# Patient Record
Sex: Male | Born: 1935 | Race: White | Hispanic: No | Smoking: Never smoker
Health system: Southern US, Community
[De-identification: ages and names within clinical notes are randomized; demographics above are authoritative.]

## PROBLEM LIST (undated history)

## (undated) DIAGNOSIS — E119 Type 2 diabetes mellitus without complications: Secondary | ICD-10-CM

## (undated) DIAGNOSIS — G4700 Insomnia, unspecified: Secondary | ICD-10-CM

## (undated) DIAGNOSIS — E079 Disorder of thyroid, unspecified: Secondary | ICD-10-CM

## (undated) DIAGNOSIS — F0390 Unspecified dementia without behavioral disturbance: Secondary | ICD-10-CM

## (undated) DIAGNOSIS — I10 Essential (primary) hypertension: Secondary | ICD-10-CM

## (undated) DIAGNOSIS — N400 Enlarged prostate without lower urinary tract symptoms: Secondary | ICD-10-CM

## (undated) DIAGNOSIS — I4891 Unspecified atrial fibrillation: Secondary | ICD-10-CM

## (undated) DIAGNOSIS — E039 Hypothyroidism, unspecified: Secondary | ICD-10-CM

## (undated) DIAGNOSIS — F419 Anxiety disorder, unspecified: Secondary | ICD-10-CM

## (undated) HISTORY — DX: Disorder of thyroid, unspecified: E07.9

---

## 1998-08-11 ENCOUNTER — Encounter: Admission: RE | Admit: 1998-08-11 | Discharge: 1998-11-09 | Payer: Self-pay | Admitting: Endocrinology

## 2010-09-17 ENCOUNTER — Other Ambulatory Visit: Payer: Self-pay | Admitting: Specialist

## 2010-09-17 DIAGNOSIS — IMO0002 Reserved for concepts with insufficient information to code with codable children: Secondary | ICD-10-CM

## 2010-09-22 ENCOUNTER — Other Ambulatory Visit: Payer: Self-pay

## 2010-09-29 ENCOUNTER — Ambulatory Visit
Admission: RE | Admit: 2010-09-29 | Discharge: 2010-09-29 | Disposition: A | Discharge status: Home / Self Care | Payer: Medicare Other | Source: Ambulatory Visit | Attending: Specialist | Admitting: Specialist

## 2010-09-29 DIAGNOSIS — IMO0002 Reserved for concepts with insufficient information to code with codable children: Secondary | ICD-10-CM

## 2011-01-18 ENCOUNTER — Ambulatory Visit: Payer: Medicare Other | Admitting: Internal Medicine

## 2013-03-30 ENCOUNTER — Encounter (HOSPITAL_COMMUNITY)
Admission: EM | Disposition: A | Discharge status: Home / Self Care | Payer: Self-pay | Source: Home / Self Care | Attending: Emergency Medicine

## 2013-03-30 ENCOUNTER — Other Ambulatory Visit: Payer: Self-pay

## 2013-03-30 ENCOUNTER — Other Ambulatory Visit: Payer: Self-pay | Admitting: Ophthalmology

## 2013-03-30 ENCOUNTER — Encounter (HOSPITAL_COMMUNITY): Payer: Medicare Other | Admitting: Anesthesiology

## 2013-03-30 ENCOUNTER — Emergency Department (HOSPITAL_COMMUNITY)
Admission: EM | Admit: 2013-03-30 | Discharge: 2013-03-30 | Disposition: A | Discharge status: Home / Self Care | Payer: Medicare Other | Attending: Emergency Medicine | Admitting: Emergency Medicine

## 2013-03-30 ENCOUNTER — Emergency Department (HOSPITAL_COMMUNITY): Payer: Medicare Other | Admitting: Anesthesiology

## 2013-03-30 DIAGNOSIS — H5712 Ocular pain, left eye: Secondary | ICD-10-CM

## 2013-03-30 DIAGNOSIS — H20059 Hypopyon, unspecified eye: Secondary | ICD-10-CM | POA: Insufficient documentation

## 2013-03-30 DIAGNOSIS — E119 Type 2 diabetes mellitus without complications: Secondary | ICD-10-CM | POA: Insufficient documentation

## 2013-03-30 DIAGNOSIS — I10 Essential (primary) hypertension: Secondary | ICD-10-CM | POA: Insufficient documentation

## 2013-03-30 DIAGNOSIS — K219 Gastro-esophageal reflux disease without esophagitis: Secondary | ICD-10-CM | POA: Insufficient documentation

## 2013-03-30 HISTORY — PX: PARS PLANA VITRECTOMY: SHX2166

## 2013-03-30 LAB — CBC WITH DIFFERENTIAL/PLATELET
Basophils Absolute: 0 10*3/uL (ref 0.0–0.1)
Basophils Relative: 0 % (ref 0–1)
Eosinophils Relative: 2 % (ref 0–5)
HCT: 42.6 % (ref 39.0–52.0)
Hemoglobin: 14.9 g/dL (ref 13.0–17.0)
Lymphocytes Relative: 20 % (ref 12–46)
MCHC: 35 g/dL (ref 30.0–36.0)
Monocytes Absolute: 1 10*3/uL (ref 0.1–1.0)
Neutro Abs: 8.8 10*3/uL — ABNORMAL HIGH (ref 1.7–7.7)
Neutrophils Relative %: 70 % (ref 43–77)
RBC: 4.58 MIL/uL (ref 4.22–5.81)
RDW: 14.1 % (ref 11.5–15.5)
WBC: 12.6 10*3/uL — ABNORMAL HIGH (ref 4.0–10.5)

## 2013-03-30 LAB — COMPREHENSIVE METABOLIC PANEL
ALT: 10 U/L (ref 0–53)
AST: 16 U/L (ref 0–37)
Albumin: 3.8 g/dL (ref 3.5–5.2)
Alkaline Phosphatase: 75 U/L (ref 39–117)
BUN: 12 mg/dL (ref 6–23)
CO2: 25 mEq/L (ref 19–32)
Chloride: 97 mEq/L (ref 96–112)
Creatinine, Ser: 1.04 mg/dL (ref 0.50–1.35)
Potassium: 3.8 mEq/L (ref 3.5–5.1)
Total Bilirubin: 0.5 mg/dL (ref 0.3–1.2)
Total Protein: 7.3 g/dL (ref 6.0–8.3)

## 2013-03-30 LAB — GRAM STAIN

## 2013-03-30 LAB — GLUCOSE, CAPILLARY: Glucose-Capillary: 173 mg/dL — ABNORMAL HIGH (ref 70–99)

## 2013-03-30 SURGERY — PARS PLANA VITRECTOMY 25 GAUGE FOR ENDOPHTHALMITIS
Anesthesia: Monitor Anesthesia Care | Laterality: Left

## 2013-03-30 SURGERY — PARS PLANA VITRECTOMY 25 GAUGE FOR ENDOPHTHALMITIS
Anesthesia: Monitor Anesthesia Care | Site: Eye | Laterality: Left | Wound class: Clean Contaminated

## 2013-03-30 MED ORDER — BUPIVACAINE HCL (PF) 0.25 % IJ SOLN
INTRAMUSCULAR | Status: DC | PRN
  Administered 2013-03-30: 10 mL

## 2013-03-30 MED ORDER — CYCLOPENTOLATE HCL 1 % OP SOLN: 1.0000 [drp] | OPHTHALMIC | Status: AC | PRN

## 2013-03-30 MED ORDER — EPINEPHRINE HCL 1 MG/ML IJ SOLN
INTRAMUSCULAR | Status: AC
  Filled 2013-03-30: qty 1

## 2013-03-30 MED ORDER — EPINEPHRINE HCL 1 MG/ML IJ SOLN
INTRAMUSCULAR | Status: DC | PRN
  Administered 2013-03-30: .3 mL

## 2013-03-30 MED ORDER — GATIFLOXACIN 0.5 % OP SOLN: 1.0000 [drp] | OPHTHALMIC | Status: AC | PRN

## 2013-03-30 MED ORDER — DEXTROSE 5 % IV SOLN
1.0000 g | Freq: Three times a day (TID) | INTRAVENOUS | Status: DC
  Administered 2013-03-30: 1 g via INTRAVENOUS
  Filled 2013-03-30 (×3): qty 1

## 2013-03-30 MED ORDER — LACTATED RINGERS IV SOLN
INTRAVENOUS | Status: DC | PRN
  Administered 2013-03-30: 18:00:00 via INTRAVENOUS

## 2013-03-30 MED ORDER — BSS IO SOLN
INTRAOCULAR | Status: AC
  Filled 2013-03-30: qty 15

## 2013-03-30 MED ORDER — MIDAZOLAM HCL 2 MG/2ML IJ SOLN: 1.0000 mg | INTRAMUSCULAR | Status: DC | PRN

## 2013-03-30 MED ORDER — PROPOFOL 10 MG/ML IV BOLUS
INTRAVENOUS | Status: DC | PRN
  Administered 2013-03-30: 50 mg via INTRAVENOUS
  Administered 2013-03-30: 20 mg via INTRAVENOUS

## 2013-03-30 MED ORDER — BSS PLUS IO SOLN
INTRAOCULAR | Status: DC | PRN
  Administered 2013-03-30: 1 via INTRAOCULAR

## 2013-03-30 MED ORDER — BUPIVACAINE HCL (PF) 0.25 % IJ SOLN
INTRAMUSCULAR | Status: AC
  Filled 2013-03-30: qty 30

## 2013-03-30 MED ORDER — VANCOMYCIN INTRAVITREAL INJECTION 1 MG/0.1 ML
1.0000 mg | INTRAOCULAR | Status: AC
  Administered 2013-03-30: 1 mg via INTRAVITREAL
  Filled 2013-03-30: qty 0.1

## 2013-03-30 MED ORDER — OXYCODONE HCL 5 MG/5ML PO SOLN: 5.0000 mg | Freq: Once | ORAL | Status: DC | PRN

## 2013-03-30 MED ORDER — CEFTAZIDIME INTRAVITREAL INJECTION 2.25 MG/0.1 ML
2.2500 mg | INTRAVITREAL | Status: DC
  Filled 2013-03-30: qty 0.1

## 2013-03-30 MED ORDER — SODIUM HYALURONATE 10 MG/ML IO SOLN
INTRAOCULAR | Status: AC
  Filled 2013-03-30: qty 0.85

## 2013-03-30 MED ORDER — HYPROMELLOSE (GONIOSCOPIC) 2.5 % OP SOLN
OPHTHALMIC | Status: DC | PRN
  Administered 2013-03-30: 3 [drp] via OPHTHALMIC

## 2013-03-30 MED ORDER — FENTANYL CITRATE 0.05 MG/ML IJ SOLN: 25.0000 ug | INTRAMUSCULAR | Status: DC | PRN

## 2013-03-30 MED ORDER — LIDOCAINE HCL 2 % IJ SOLN
INTRAMUSCULAR | Status: AC
  Filled 2013-03-30: qty 20

## 2013-03-30 MED ORDER — VANCOMYCIN HCL 1000 MG IV SOLR
1000.0000 mg | INTRAVENOUS | Status: DC | PRN
  Administered 2013-03-30: 1000 mg via INTRAVENOUS

## 2013-03-30 MED ORDER — OXYCODONE HCL 5 MG PO TABS: 5.0000 mg | ORAL_TABLET | Freq: Once | ORAL | Status: DC | PRN

## 2013-03-30 MED ORDER — NA CHONDROIT SULF-NA HYALURON 40-30 MG/ML IO SOLN
INTRAOCULAR | Status: AC
  Filled 2013-03-30: qty 0.5

## 2013-03-30 MED ORDER — TETRACAINE HCL 0.5 % OP SOLN
OPHTHALMIC | Status: AC
  Filled 2013-03-30: qty 2

## 2013-03-30 MED ORDER — BSS PLUS IO SOLN
INTRAOCULAR | Status: AC
  Filled 2013-03-30: qty 500

## 2013-03-30 MED ORDER — PHENYLEPHRINE HCL 2.5 % OP SOLN: 1.0000 [drp] | OPHTHALMIC | Status: DC | PRN

## 2013-03-30 MED ORDER — HYPROMELLOSE (GONIOSCOPIC) 2.5 % OP SOLN
OPHTHALMIC | Status: AC
  Filled 2013-03-30: qty 15

## 2013-03-30 MED ORDER — DEXAMETHASONE SODIUM PHOSPHATE 10 MG/ML IJ SOLN
INTRAMUSCULAR | Status: AC
  Filled 2013-03-30: qty 1

## 2013-03-30 MED ORDER — FENTANYL CITRATE 0.05 MG/ML IJ SOLN: 50.0000 ug | Freq: Once | INTRAMUSCULAR | Status: DC

## 2013-03-30 MED ORDER — CEFTAZIDIME INTRAVITREAL INJECTION 2.25 MG/0.1 ML
INTRAVITREAL | Status: DC | PRN
  Administered 2013-03-30: 2.25 mg via INTRAVITREAL

## 2013-03-30 MED ORDER — VANCOMYCIN HCL IN DEXTROSE 1-5 GM/200ML-% IV SOLN
1000.0000 mg | INTRAVENOUS | Status: DC
  Filled 2013-03-30: qty 200

## 2013-03-30 SURGICAL SUPPLY — 52 items
APL SRG 3 HI ABS STRL LF PLS (MISCELLANEOUS)
APPLICATOR COTTON TIP 6IN STRL (MISCELLANEOUS) ×4 IMPLANT
APPLICATOR DR MATTHEWS STRL (MISCELLANEOUS) IMPLANT
CANNULA ANT CHAM MAIN (OPHTHALMIC RELATED) IMPLANT
CANNULA FLEX TIP 25G (CANNULA) IMPLANT
CANNULA VLV SOFT TIP 25G (OPHTHALMIC) ×1 IMPLANT
CANNULA VLV SOFT TIP 25GA (OPHTHALMIC) ×2 IMPLANT
CLOTH BEACON ORANGE TIMEOUT ST (SAFETY) ×2 IMPLANT
CORDS BIPOLAR (ELECTRODE) IMPLANT
COVER MAYO STAND STRL (DRAPES) ×2 IMPLANT
DRAPE OPHTHALMIC 77X100 STRL (CUSTOM PROCEDURE TRAY) ×2 IMPLANT
FILTER BLUE MILLIPORE (MISCELLANEOUS) IMPLANT
FILTER STRAW FLUID ASPIR (MISCELLANEOUS) IMPLANT
FORCEPS ECKARDT ILM 25G SERR (OPHTHALMIC RELATED) IMPLANT
GAS OPHTHALMIC (MISCELLANEOUS) IMPLANT
GLOVE ECLIPSE 6.5 STRL STRAW (GLOVE) ×2 IMPLANT
GLOVE SS BIOGEL STRL SZ 8.5 (GLOVE) ×1 IMPLANT
GLOVE SUPERSENSE BIOGEL SZ 8.5 (GLOVE) ×2
GOWN STRL NON-REIN LRG LVL3 (GOWN DISPOSABLE) ×2 IMPLANT
ILLUMINATOR ENDO 25GA (MISCELLANEOUS) ×2 IMPLANT
KIT BASIN OR (CUSTOM PROCEDURE TRAY) ×2 IMPLANT
KIT ROOM TURNOVER OR (KITS) ×2 IMPLANT
LENS BIOM SUPER VIEW SET DISP (OPHTHALMIC RELATED) ×2 IMPLANT
MARKER SKIN DUAL TIP RULER LAB (MISCELLANEOUS) ×2 IMPLANT
MASK EYE SHIELD (GAUZE/BANDAGES/DRESSINGS) ×1 IMPLANT
NDL 18GX1X1/2 (RX/OR ONLY) (NEEDLE) IMPLANT
NDL 25GX 5/8IN NON SAFETY (NEEDLE) ×1 IMPLANT
NDL HYPO 30X.5 LL (NEEDLE) ×3 IMPLANT
NEEDLE 18GX1X1/2 (RX/OR ONLY) (NEEDLE) IMPLANT
NEEDLE 25GX 5/8IN NON SAFETY (NEEDLE) ×2 IMPLANT
NEEDLE HYPO 30X.5 LL (NEEDLE) ×6 IMPLANT
PACK VITRECTOMY CUSTOM (CUSTOM PROCEDURE TRAY) ×2 IMPLANT
PAD ARMBOARD 7.5X6 YLW CONV (MISCELLANEOUS) ×4 IMPLANT
PAD EYE OVAL STERILE LF (GAUZE/BANDAGES/DRESSINGS) ×1 IMPLANT
PAK PIK VITRECTOMY CVS 25GA (OPHTHALMIC) ×2 IMPLANT
PENCIL BIPOLAR 25GA STR DISP (OPHTHALMIC RELATED) IMPLANT
PROBE DIRECTIONAL LASER (MISCELLANEOUS) IMPLANT
PROBE LASER ILLUM FLEX CVD 25G (OPHTHALMIC) IMPLANT
RESERVOIR BACK FLUSH (MISCELLANEOUS) IMPLANT
ROLLS DENTAL (MISCELLANEOUS) IMPLANT
STOCKINETTE IMPERVIOUS 9X36 MD (GAUZE/BANDAGES/DRESSINGS) ×4 IMPLANT
STOPCOCK 4 WAY LG BORE MALE ST (IV SETS) IMPLANT
SUT MERSILENE 5 0 RD 1 DA (SUTURE) ×1 IMPLANT
SUT VICRYL 7 0 TG140 8 (SUTURE) IMPLANT
SWAB COLLECTION DEVICE MRSA (MISCELLANEOUS) IMPLANT
SYR 30ML SLIP (SYRINGE) ×1 IMPLANT
SYR 5ML LL (SYRINGE) ×1 IMPLANT
SYR TB 1ML LUER SLIP (SYRINGE) ×6 IMPLANT
SYRINGE 10CC LL (SYRINGE) ×1 IMPLANT
TOWEL OR 17X24 6PK STRL BLUE (TOWEL DISPOSABLE) ×4 IMPLANT
TROCAR CANNULA 25GA (CANNULA) ×1 IMPLANT
TUBE ANAEROBIC SPECIMEN COL (MISCELLANEOUS) IMPLANT

## 2013-03-30 NOTE — Preoperative (Signed)
Beta Blockers   Reason not to administer Beta Blockers:Not Applicable 

## 2013-03-30 NOTE — ED Notes (Signed)
Pt states eye injection on Wednesday to help him see better and then it started hurting and appears inflammed.  Pt went back to his office and put drops in his eye.  Pt was sent here because Dr. Luciana Axe is going to draw fluid off his eye in the operating room.

## 2013-03-30 NOTE — Anesthesia Postprocedure Evaluation (Signed)
  Anesthesia Post-op Note  Patient: Joe [name]  Procedure(s) Performed: Procedure(s) with comments: PARS PLANA VITRECTOMY 25 GAUGE FOR ENDOPHTHALMITIS (Left) - LOCAL RETROBULBAR,,  USE MARCAINE 0.5%  Patient Location: PACU  Anesthesia Type:MAC  Level of Consciousness: awake  Airway and Oxygen Therapy: Patient Spontanous Breathing  Post-op Pain: none  Post-op Assessment: Post-op Vital signs reviewed, Patient's Cardiovascular Status Stable, Respiratory Function Stable, Patent Airway, No signs of Nausea or vomiting and Pain level controlled  Post-op Vital Signs: Reviewed and stable  Complications: No apparent anesthesia complications

## 2013-03-30 NOTE — ED Provider Notes (Signed)
  This was a shared visit with a mid-level provided (NP or PA).  Throughout the patient's course I was available for consultation/collaboration. This patient was brought to the emergency department by ophthalmology in preparation for surgical procedure.  On my exam he was in no distress, with no significant ongoing complaints beyond concern for eye issue.     Gerhard Munch, MD 03/30/13 [phone]

## 2013-03-30 NOTE — Anesthesia Preprocedure Evaluation (Addendum)
Anesthesia Evaluation  Patient identified by MRN, date of birth, ID band Patient awake    Reviewed: Allergy & Precautions, H&P , NPO status , Patient's Chart, lab work & pertinent test results  Airway Mallampati: II TM Distance: >3 FB Neck ROM: Full    Dental   Pulmonary  breath sounds clear to auscultation        Cardiovascular hypertension, Rhythm:Regular Rate:Normal     Neuro/Psych    GI/Hepatic GERD-  ,  Endo/Other  diabetes, Type 2, Oral Hypoglycemic Agents  Renal/GU      Musculoskeletal   Abdominal (+) + obese,   Peds  Hematology   Anesthesia Other Findings   Reproductive/Obstetrics                          Anesthesia Physical Anesthesia Plan  ASA: II and emergent  Anesthesia Plan: MAC   Post-op Pain Management:    Induction: Intravenous  Airway Management Planned: Nasal Cannula  Additional Equipment:   Intra-op Plan:   Post-operative Plan:   Informed Consent: I have reviewed the patients History and Physical, chart, labs and discussed the procedure including the risks, benefits and alternatives for the proposed anesthesia with the patient or authorized representative who has indicated his/her understanding and acceptance.     Plan Discussed with: CRNA and Surgeon  Anesthesia Plan Comments:         Anesthesia Quick Evaluation

## 2013-03-30 NOTE — Transfer of Care (Signed)
Immediate Anesthesia Transfer of Care Note  Patient: Joe [name]  Procedure(s) Performed: Procedure(s) with comments: PARS PLANA VITRECTOMY 25 GAUGE FOR ENDOPHTHALMITIS (Left) - LOCAL RETROBULBAR,,  USE MARCAINE 0.5%  Patient Location: PACU  Anesthesia Type:MAC  Level of Consciousness: awake and alert   Airway & Oxygen Therapy: Patient Spontanous Breathing  Post-op Assessment: Report given to PACU RN  Post vital signs: Reviewed and stable  Complications: No apparent anesthesia complications

## 2013-03-30 NOTE — Op Note (Signed)
Joe [name]              ACCOUNT NO.:  [id number]  MEDICAL RECORD NO.:  [id number]  LOCATION:  [facility]                         FACILITY:  [facility]  PHYSICIAN:  Jillyn Hidden A. Donna Silverman, M.D.   DATE OF BIRTH:  [birth year]  DATE OF PROCEDURE:  03/30/2013 DATE OF DISCHARGE:  03/30/2013                              OPERATIVE REPORT   PREOPERATIVE DIAGNOSIS:  Acute endophthalmitis, left eye.  POSTOPERATIVE DIAGNOSIS:  Acute endophthalmitis, left eye - suspected.  PROCEDURES: 1. Posterior vitrectomy. 2. Intravitreal and injection of antibiotics - pharmacological agent -     Fortaz 2.25 mg and vancomycin 1.0 mg. 3. Vitreous cultures.  SURGEON:  Alford Highland. Brenton Joines, M.D.  ANESTHESIA:  Local retrobulbar with monitored anesthesia control.  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  Suspected endophthalmitis was noted.  The patient understands that this is an attempt to diagnostically evaluate the vitreous debris, hypopyon in the anterior chamber as well as vitreous debris in the vitreous cavity and therapeutically debulk the vitreous cavity with its inflammatory debris and suspect an endophthalmitis.  He also understands the need for delivery of intravitreal antibiotics for definitive therapy.  He understands the risks of anesthesia including the occurrence of death, loss of the eye from the underlying condition including, but not limited to hemorrhage, infection, scarring, need for another surgery, no change in vision, loss of vision, progressive disease despite intervention.  Appropriate signed consent was obtained.  DESCRIPTION OF PROCEDURE:  The patient was taken to the operating room. In the operating room, appropriate monitors was followed by mild sedation.  Appropriate site selection was confirmed by the  operating staff was the left eye.  Thereafter under mild sedation, 0.5% Marcaine was delivered 5 mL retrobulbar with additional 5 mL laterally in fashion of modified Darel Hong.  The right periocular region was then sterilely prepped and draped in usual ophthalmic fashion.  Lid speculum applied. A 25-gauge trocar was placed in the inferotemporal quadrant.  Efflux noted there was vitreous debris.  Infusion was then secured and turned on.  Superior trocars were applied.  Core vitrectomy was then begun with instruments immediately behind the posterior lens implant.  No complications occurred.  There was a significant amount of anterior chamber debris.  A fourth 25-gauge trocar was placed in the paracentesis fashion and the infusion was then secured to this area and equalization of the pressures was maintained, this allowed for debulking of the anterior chamber debris and hypopyon using a separate paracentesis incision and placing the 25-gauge vitrectomy instrument.  Visualization was improved.  Limited core vitrectomy was then completed posteriorly to debulk the vitreous debris.  Poor corneal clarity did not allow any aggressive maneuvers posteriorly.  Instruments were removed form the eye.  Inferior peripheral iridectomy had been completed from a posterior approach to help to equalize the intraocular pressures, the anterior and posterior chambers as well as definitively allow for diffusion of intravitreal antibiotics throughout the eye.  This time, the infusion was changed back to a posterior approach as the trocar and  the anterior chamber had been removed.  At this time, all trocars were removed from the eye and then the globe by appropriate tension.  Intravitreal antibiotics were delivered.  A 0.1 mL of each of Fortaz and vancomycin was then delivered into the vitreous.  No complications occurred.  Remnants of the Fortaz and vancomycin were injected subconjunctival.  The intraocular  pressure was assessed and found to be adequate.  Sterile patch and Fox shield applied.  Wounds were secured.  The patient was taken to the PACU in good and stable condition without complications.  The patient will be discharged home as an outpatient later on today.     Alford Highland Myeesha Shane, M.D.     GAR/MEDQ  D:  03/30/2013  T:  03/30/2013  Job:  [id number]

## 2013-03-30 NOTE — ED Provider Notes (Signed)
CSN: [id number]     Arrival date & time 03/30/13  1633 History   First MD Initiated Contact with Patient 03/30/13 1650     Chief Complaint  Patient presents with  . Eye Problem   (Consider location/radiation/quality/duration/timing/severity/associated sxs/prior Treatment) HPI Joe [name] is a 79 y.o. male who presents to ED today after he was seen by Dr. Luciana Axe with ophthalmology and was told to come to ER to get ready for surgery. PT states he was told there is "infection in the eye." Pt denies significant pain to the eye. Denies visual changes. Did speak with Dr. Luciana Axe who asked for preop labs and to get patient ready for OR.  No past medical history on file. No past surgical history on file. No family history on file. History  Substance Use Topics  . Smoking status: Not on file  . Smokeless tobacco: Not on file  . Alcohol Use: Not on file    Review of Systems  Constitutional: Negative for fever and chills.  Eyes: Positive for discharge and redness. Negative for photophobia, pain and visual disturbance.  Respiratory: Negative for cough, chest tightness and shortness of breath.   Cardiovascular: Negative for chest pain, palpitations and leg swelling.  Genitourinary: Negative for dysuria, urgency, frequency and hematuria.  Skin: Negative for rash.  Allergic/Immunologic: Negative for immunocompromised state.  Neurological: Negative for dizziness, weakness, light-headedness, numbness and headaches.    Allergies  Review of patient's allergies indicates no known allergies.  Home Medications   Current Outpatient Rx  Name  Route  Sig  Dispense  Refill  . Alogliptin-Pioglitazone (OSENI) 25-30 MG TABS   Oral   Take 1 tablet by mouth daily.         . cetirizine (ZYRTEC) 10 MG tablet   Oral   Take 10 mg by mouth daily as needed for allergies.         . diazepam (VALIUM) 5 MG tablet   Oral   Take 5 mg by mouth at bedtime as needed for anxiety or sedation.          Marland Kitchen levothyroxine (SYNTHROID, LEVOTHROID) 88 MCG tablet   Oral   Take 88 mcg by mouth daily before breakfast.         . ramipril (ALTACE) 5 MG capsule   Oral   Take 5 mg by mouth daily.         . tamsulosin (FLOMAX) 0.4 MG CAPS capsule   Oral   Take 0.4 mg by mouth daily.          BP 169/89  Pulse 82  Temp(Src) 98.1 F (36.7 C) (Oral)  Resp 18  Wt 193 lb 3 oz (87.629 kg)  SpO2 97% Physical Exam  Nursing note and vitals reviewed. Constitutional: He is oriented to person, place, and time. He appears well-developed and well-nourished. No distress.  HENT:  Head: Normocephalic and atraumatic.  Eyes: EOM are normal.  Right eye appears normal. Left eye is injected, pupil sluggish to react  Neck: Neck supple.  Cardiovascular: Normal rate, regular rhythm and normal heart sounds.   Pulmonary/Chest: Effort normal. No respiratory distress. He has no wheezes. He has no rales.  Musculoskeletal: He exhibits no edema.  Neurological: He is alert and oriented to person, place, and time.  Skin: Skin is warm and dry.    ED Course  Procedures (including critical care time) Labs Review Labs Reviewed  CBC WITH DIFFERENTIAL - Abnormal; Notable for the following:    WBC 12.6 (*)  Neutro Abs 8.8 (*)    All other components within normal limits  COMPREHENSIVE METABOLIC PANEL - Abnormal; Notable for the following:    Glucose, Bld 178 (*)    GFR calc non Af Amer 63 (*)    GFR calc Af Amer 73 (*)    All other components within normal limits   Imaging Review No results found.  EKG Interpretation    Date/Time:    Ventricular Rate:    PR Interval:    QRS Duration:   QT Interval:    QTC Calculation:   R Axis:     Text Interpretation:              MDM  No diagnosis found.  Patient with left thigh pain sent here by Dr. Luciana Axe for preop. Patient's labs, chest x-ray, EKG ordered. I did speak with Dr. Luciana Axe who said he will take him to the OR.   Lottie Mussel,  PA-C 03/30/13 1851

## 2013-03-30 NOTE — H&P (Signed)
Joe [name] is an 79 y.o. male.   Chief Complaint: VISON LOSS AND PAIN LEFT EYE, FOR 2 DAYS. HPI: Patient undergoing longterm repetitive injections of intravitreal AVASTIN, for wet Age related macular degeneration with prior successful preservation of vision.  Now with 2 days of pain, vision loss left eye, after recent intravitreal injection of Avastin.   No past medical history on file.  No past surgical history on file.  No family history on file. Social History:  has no tobacco, alcohol, and drug history on file.  Allergies: No Known Allergies   (Not in a hospital admission)  No results found for this or any previous visit (from the past 48 hour(s)). No results found.  Review of Systems  Constitutional: Negative.   HENT: Negative.   Eyes: Positive for blurred vision.  Respiratory: Negative.   Cardiovascular: Negative.   Gastrointestinal: Negative.   Skin: Negative.   Neurological: Negative.   Endo/Heme/Allergies: Negative.   Psychiatric/Behavioral: Negative.     There were no vitals taken for this visit. Physical Exam  Constitutional: He is oriented to person, place, and time. He appears well-developed and well-nourished.  HENT:  Head: Normocephalic and atraumatic.  Eyes: EOM are normal. Pupils are equal, round, and reactive to light. Right eye exhibits no chemosis, no discharge, no exudate and no hordeolum. No foreign body present in the right eye. Left eye exhibits chemosis. Left eye exhibits no discharge, no exudate and no hordeolum. No foreign body present in the left eye. Right conjunctiva is not injected. Right conjunctiva has no hemorrhage. Left conjunctiva is injected. No scleral icterus.    VISION OD 20/25,,,,0S CF  1 ft.    VITRITIS, OS, WITH HYPOPYON OS, 3 DAYS POST INJECTION OF AVASTIN FOR WET AGE RELATED MACULAR DEGENERATION.  POSSIBLE ENDOPHTHALMITIS LEFT EYE  Neck: Normal range of motion.  Cardiovascular: Normal rate.   Respiratory: Effort normal.    GI: Soft.  Neurological: He is alert and oriented to person, place, and time.  Skin: Skin is warm.  Psychiatric: His speech is normal and behavior is normal. Judgment and thought content normal. His mood appears anxious. Cognition and memory are normal.  Understands need for emergency surgical intervention     Assessment/Plan SUSPECTED ENDOPHTHALMITIS LEFT EYE, WITH HYPOPYON, PAIN AND VISION LOSS.  PLAN:  DIAGNOSTIC, AND THERAPEUTIC VITRECTOMY OS WITH INJECTION OF INTRAVITREAL ANTIBIOTICS LEFT EYE.  Terese Heier A 03/30/2013, 5:15 PM

## 2013-03-30 NOTE — ED Notes (Signed)
OR called and is ready for pt.  Will do chest xray and consent in OR.

## 2013-03-30 NOTE — Brief Op Note (Signed)
03/30/2013  7:28 PM  PATIENT:  Joe [name]  79 y.o. male  PRE-OPERATIVE DIAGNOSIS:  ENDOPHTHALMITIS, ACUTE   LEFT EYE  POST-OPERATIVE DIAGNOSIS:  endophithalmitis,acute left eye  PROCEDURE:  Procedure(s) with comments: PARS PLANA VITRECTOMY 25 GAUGE FOR ENDOPHTHALMITIS (Left) - LOCAL RETROBULBAR,,  USE MARCAINE 0.5%,,,, injection of intravitreal antibiotics FORTAZ 2.25 MGM,,VANCOMYCIN 1.0 MGM,,,,  AND VITREOUS CULTURES  SURGEON:  Surgeon(s) and Role:    * Edmon Crape, MD - Primary  PHYSICIAN ASSISTANT:   ASSISTANTS: none   ANESTHESIA:   MAC,, RETROBULBAR  EBL:     BLOOD ADMINISTERED:none  DRAINS: none   LOCAL MEDICATIONS USED:  MARCAINE   0.5 %,,,10 CC TOTAL  SPECIMEN:  No Specimen  DISPOSITION OF SPECIMEN:  N/A  COUNTS:  YES  TOURNIQUET:  * No tourniquets in log *  DICTATION: .Other Dictation: Dictation Number [id number]  PLAN OF CARE: Discharge to home after PACU  PATIENT DISPOSITION:  PACU - hemodynamically stable.   Delay start of Pharmacological VTE agent (>24hrs) due to surgical blood loss or risk of bleeding: not applicable

## 2013-04-02 ENCOUNTER — Encounter (HOSPITAL_COMMUNITY): Payer: Self-pay | Admitting: Ophthalmology

## 2013-04-02 LAB — BODY FLUID CULTURE

## 2013-09-10 ENCOUNTER — Ambulatory Visit (INDEPENDENT_AMBULATORY_CARE_PROVIDER_SITE_OTHER): Payer: Medicare Other | Admitting: Family Medicine

## 2013-09-10 ENCOUNTER — Ambulatory Visit: Payer: Medicare Other

## 2013-09-10 VITALS — BP 132/78 | HR 68 | Temp 98.1°F | Resp 16

## 2013-09-10 DIAGNOSIS — M545 Low back pain, unspecified: Secondary | ICD-10-CM

## 2013-09-10 DIAGNOSIS — E039 Hypothyroidism, unspecified: Secondary | ICD-10-CM

## 2013-09-10 DIAGNOSIS — S61402A Unspecified open wound of left hand, initial encounter: Secondary | ICD-10-CM

## 2013-09-10 DIAGNOSIS — S61409A Unspecified open wound of unspecified hand, initial encounter: Secondary | ICD-10-CM

## 2013-09-10 DIAGNOSIS — N400 Enlarged prostate without lower urinary tract symptoms: Secondary | ICD-10-CM | POA: Insufficient documentation
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
## 2013-09-11 ENCOUNTER — Ambulatory Visit
Admission: RE | Admit: 2013-09-11 | Discharge: 2013-09-11 | Disposition: A | Discharge status: Home / Self Care | Payer: Medicare Other | Source: Ambulatory Visit | Attending: Family Medicine | Admitting: Family Medicine

## 2013-09-12 ENCOUNTER — Telehealth: Payer: Self-pay | Admitting: Radiology

## 2013-09-12 NOTE — Telephone Encounter (Signed)
Please let them know that the x-rays showed arthritis but no fracture. I would like to recheck him in next 24 hours or so to make sure the hand is healing

## 2013-09-12 NOTE — Telephone Encounter (Signed)
Carrie from the lab has called me her to advise patients sister Joe [name] called, indicates you called last pm at 9, and they have additional questions. I can call for you, what is going on with patient, what do I need to advise patient?

## 2013-09-12 NOTE — Telephone Encounter (Signed)
Patient's nephew called to return Amy's call. Please call back. Thank you.

## 2013-09-12 NOTE — Telephone Encounter (Signed)
Thanks I called pt to advise, left message for her to call me back

## 2013-09-13 ENCOUNTER — Emergency Department (HOSPITAL_COMMUNITY)
Admission: EM | Admit: 2013-09-13 | Discharge: 2013-09-13 | Disposition: A | Discharge status: Home / Self Care | Payer: Medicare Other | Attending: Emergency Medicine | Admitting: Emergency Medicine

## 2013-09-13 ENCOUNTER — Encounter (HOSPITAL_COMMUNITY): Payer: Self-pay | Admitting: Emergency Medicine

## 2013-09-13 DIAGNOSIS — F411 Generalized anxiety disorder: Secondary | ICD-10-CM | POA: Insufficient documentation

## 2013-09-13 DIAGNOSIS — S61419A Laceration without foreign body of unspecified hand, initial encounter: Secondary | ICD-10-CM

## 2013-09-13 DIAGNOSIS — M549 Dorsalgia, unspecified: Secondary | ICD-10-CM

## 2013-09-13 DIAGNOSIS — S61409A Unspecified open wound of unspecified hand, initial encounter: Secondary | ICD-10-CM | POA: Insufficient documentation

## 2013-09-13 DIAGNOSIS — IMO0002 Reserved for concepts with insufficient information to code with codable children: Secondary | ICD-10-CM | POA: Insufficient documentation

## 2013-09-13 DIAGNOSIS — Y9389 Activity, other specified: Secondary | ICD-10-CM | POA: Insufficient documentation

## 2013-09-13 DIAGNOSIS — Y92410 Unspecified street and highway as the place of occurrence of the external cause: Secondary | ICD-10-CM | POA: Insufficient documentation

## 2013-09-13 DIAGNOSIS — Z8781 Personal history of (healed) traumatic fracture: Secondary | ICD-10-CM | POA: Insufficient documentation

## 2013-09-13 MED ORDER — MUPIROCIN CALCIUM 2 % EX CREA: 1.0000 "application " | TOPICAL_CREAM | Freq: Two times a day (BID) | CUTANEOUS | Status: DC

## 2013-09-13 NOTE — ED Notes (Signed)
Multiple skin tears present to left dorsal hand. Wound were cleaned and are air drying. Will apply non adherent dressing after PA evaluation.

## 2013-09-13 NOTE — ED Notes (Signed)
Dressing applied. 
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
## 2013-09-13 NOTE — Telephone Encounter (Signed)
Advised sister of the results of the xray and she advised patient will come in later this morning for a recheck with you
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
## 2013-09-13 NOTE — ED Notes (Signed)
Bandage present to left dorsal hand with gauze that is adhered to the skin. Hand is currently soaking in water.

## 2013-09-13 NOTE — ED Provider Notes (Signed)
Medical screening examination/treatment/procedure(s) were conducted as a shared visit with non-physician practitioner(s) and myself.  I personally evaluated the patient during the encounter.   EKG Interpretation None      I interviewed and examined the patient. Lungs are CTAB. Cardiac exam wnl. Abdomen soft.  Multiple skin tears to the dorsal aspect of the left hand and distal forearm. No evidence of infection. Will recommend daily cleaning w/ soap/water and application of bandage/bacitracin.   Blanchard Kelch, MD 09/13/13 [phone]
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
## 2013-09-29 NOTE — Addendum Note (Signed)
Addended by: Madie Reno on: 09/29/2013 02:18 PM   Modules accepted: Orders

## 2013-10-25 ENCOUNTER — Encounter: Payer: Self-pay | Admitting: Internal Medicine

## 2014-05-13 DIAGNOSIS — H10013 Acute follicular conjunctivitis, bilateral: Secondary | ICD-10-CM | POA: Diagnosis not present

## 2014-05-30 DIAGNOSIS — H10013 Acute follicular conjunctivitis, bilateral: Secondary | ICD-10-CM | POA: Diagnosis not present

## 2014-07-22 DIAGNOSIS — C44319 Basal cell carcinoma of skin of other parts of face: Secondary | ICD-10-CM | POA: Diagnosis not present

## 2014-07-22 DIAGNOSIS — C44311 Basal cell carcinoma of skin of nose: Secondary | ICD-10-CM | POA: Diagnosis not present

## 2014-07-22 DIAGNOSIS — L570 Actinic keratosis: Secondary | ICD-10-CM | POA: Diagnosis not present

## 2014-08-28 ENCOUNTER — Emergency Department (HOSPITAL_COMMUNITY): Payer: Medicare Other

## 2014-08-28 ENCOUNTER — Ambulatory Visit (INDEPENDENT_AMBULATORY_CARE_PROVIDER_SITE_OTHER): Payer: Medicare Other | Admitting: Family Medicine

## 2014-08-28 ENCOUNTER — Observation Stay (HOSPITAL_COMMUNITY)
Admission: EM | Admit: 2014-08-28 | Discharge: 2014-08-29 | Disposition: A | Discharge status: Home / Self Care | Payer: Medicare Other | Attending: Internal Medicine | Admitting: Internal Medicine

## 2014-08-28 ENCOUNTER — Encounter (HOSPITAL_COMMUNITY): Payer: Self-pay | Admitting: Emergency Medicine

## 2014-08-28 VITALS — BP 102/60 | HR 85 | Temp 97.6°F | Resp 16 | Ht 69.5 in | Wt 174.2 lb

## 2014-08-28 DIAGNOSIS — Z792 Long term (current) use of antibiotics: Secondary | ICD-10-CM | POA: Diagnosis not present

## 2014-08-28 DIAGNOSIS — R42 Dizziness and giddiness: Secondary | ICD-10-CM

## 2014-08-28 DIAGNOSIS — E119 Type 2 diabetes mellitus without complications: Secondary | ICD-10-CM

## 2014-08-28 DIAGNOSIS — E039 Hypothyroidism, unspecified: Secondary | ICD-10-CM | POA: Diagnosis present

## 2014-08-28 DIAGNOSIS — Z79899 Other long term (current) drug therapy: Secondary | ICD-10-CM | POA: Diagnosis not present

## 2014-08-28 DIAGNOSIS — R296 Repeated falls: Secondary | ICD-10-CM | POA: Diagnosis not present

## 2014-08-28 DIAGNOSIS — R410 Disorientation, unspecified: Secondary | ICD-10-CM | POA: Diagnosis not present

## 2014-08-28 DIAGNOSIS — F1310 Sedative, hypnotic or anxiolytic abuse, uncomplicated: Secondary | ICD-10-CM | POA: Insufficient documentation

## 2014-08-28 DIAGNOSIS — N400 Enlarged prostate without lower urinary tract symptoms: Secondary | ICD-10-CM | POA: Diagnosis present

## 2014-08-28 DIAGNOSIS — M6281 Muscle weakness (generalized): Secondary | ICD-10-CM | POA: Diagnosis not present

## 2014-08-28 DIAGNOSIS — R404 Transient alteration of awareness: Secondary | ICD-10-CM | POA: Diagnosis not present

## 2014-08-28 DIAGNOSIS — E079 Disorder of thyroid, unspecified: Secondary | ICD-10-CM | POA: Insufficient documentation

## 2014-08-28 DIAGNOSIS — I10 Essential (primary) hypertension: Secondary | ICD-10-CM | POA: Diagnosis not present

## 2014-08-28 DIAGNOSIS — Z7251 High risk heterosexual behavior: Secondary | ICD-10-CM

## 2014-08-28 DIAGNOSIS — I499 Cardiac arrhythmia, unspecified: Secondary | ICD-10-CM

## 2014-08-28 DIAGNOSIS — I4891 Unspecified atrial fibrillation: Principal | ICD-10-CM | POA: Diagnosis present

## 2014-08-28 DIAGNOSIS — J9811 Atelectasis: Secondary | ICD-10-CM | POA: Diagnosis not present

## 2014-08-28 HISTORY — DX: Unspecified atrial fibrillation: I48.91

## 2014-08-28 HISTORY — DX: Anxiety disorder, unspecified: F41.9

## 2014-08-28 HISTORY — DX: Hypothyroidism, unspecified: E03.9

## 2014-08-28 HISTORY — DX: Benign prostatic hyperplasia without lower urinary tract symptoms: N40.0

## 2014-08-28 HISTORY — DX: Essential (primary) hypertension: I10

## 2014-08-28 LAB — RAPID URINE DRUG SCREEN, HOSP PERFORMED
AMPHETAMINES: NOT DETECTED
Barbiturates: NOT DETECTED
Benzodiazepines: POSITIVE — AB
Cocaine: NOT DETECTED
OPIATES: NOT DETECTED
Tetrahydrocannabinol: NOT DETECTED

## 2014-08-28 LAB — URINALYSIS, ROUTINE W REFLEX MICROSCOPIC
Bilirubin Urine: NEGATIVE
Glucose, UA: NEGATIVE mg/dL
Hgb urine dipstick: NEGATIVE
KETONES UR: NEGATIVE mg/dL
Leukocytes, UA: NEGATIVE
Nitrite: NEGATIVE
PH: 5 (ref 5.0–8.0)
Protein, ur: NEGATIVE mg/dL
Specific Gravity, Urine: 1.024 (ref 1.005–1.030)
Urobilinogen, UA: 0.2 mg/dL (ref 0.0–1.0)

## 2014-08-28 LAB — COMPREHENSIVE METABOLIC PANEL
ALBUMIN: 3.2 g/dL — AB (ref 3.5–5.2)
ALT: 15 U/L (ref 0–53)
AST: 17 U/L (ref 0–37)
Alkaline Phosphatase: 67 U/L (ref 39–117)
Anion gap: 7 (ref 5–15)
BUN: 21 mg/dL (ref 6–23)
CALCIUM: 8.9 mg/dL (ref 8.4–10.5)
CHLORIDE: 104 mmol/L (ref 96–112)
CO2: 27 mmol/L (ref 19–32)
CREATININE: 1.2 mg/dL (ref 0.50–1.35)
GFR calc Af Amer: 61 mL/min — ABNORMAL LOW (ref >90)
GFR, EST NON AFRICAN AMERICAN: 53 mL/min — AB (ref >90)
Glucose, Bld: 157 mg/dL — ABNORMAL HIGH (ref 70–99)
Potassium: 4.2 mmol/L (ref 3.5–5.1)
Sodium: 138 mmol/L (ref 135–145)
Total Bilirubin: 0.6 mg/dL (ref 0.3–1.2)
Total Protein: 6.2 g/dL (ref 6.0–8.3)

## 2014-08-28 LAB — POCT CBC
Granulocyte percent: 69.3 %G (ref 37–80)
HCT, POC: 38.5 % — AB (ref 43.5–53.7)
HEMOGLOBIN: 12.6 g/dL — AB (ref 14.1–18.1)
Lymph, poc: 1.7 (ref 0.6–3.4)
MCH: 30.5 pg (ref 27–31.2)
MCHC: 32.9 g/dL (ref 31.8–35.4)
MCV: 92.7 fL (ref 80–97)
MID (cbc): 0.3 (ref 0–0.9)
MPV: 7.8 fL (ref 0–99.8)
POC Granulocyte: 4.6 (ref 2–6.9)
POC LYMPH PERCENT: 25.8 %L (ref 10–50)
POC MID %: 4.9 % (ref 0–12)
Platelet Count, POC: 194 10*3/uL (ref 142–424)
RBC: 4.15 M/uL — AB (ref 4.69–6.13)
RDW, POC: 17.2 %
WBC: 6.7 10*3/uL (ref 4.6–10.2)

## 2014-08-28 LAB — CBC WITH DIFFERENTIAL/PLATELET
BASOS ABS: 0 10*3/uL (ref 0.0–0.1)
Basophils Relative: 0 % (ref 0–1)
EOS PCT: 3 % (ref 0–5)
Eosinophils Absolute: 0.2 10*3/uL (ref 0.0–0.7)
HCT: 34.8 % — ABNORMAL LOW (ref 39.0–52.0)
Hemoglobin: 11.5 g/dL — ABNORMAL LOW (ref 13.0–17.0)
LYMPHS PCT: 27 % (ref 12–46)
Lymphs Abs: 1.6 10*3/uL (ref 0.7–4.0)
MCH: 30.6 pg (ref 26.0–34.0)
MCHC: 33 g/dL (ref 30.0–36.0)
MCV: 92.6 fL (ref 78.0–100.0)
Monocytes Absolute: 0.5 10*3/uL (ref 0.1–1.0)
Monocytes Relative: 8 % (ref 3–12)
NEUTROS ABS: 3.7 10*3/uL (ref 1.7–7.7)
Neutrophils Relative %: 62 % (ref 43–77)
PLATELETS: 178 10*3/uL (ref 150–400)
RBC: 3.76 MIL/uL — ABNORMAL LOW (ref 4.22–5.81)
RDW: 15.6 % — AB (ref 11.5–15.5)
WBC: 5.9 10*3/uL (ref 4.0–10.5)

## 2014-08-28 LAB — TROPONIN I: Troponin I: <0.03 ng/mL (ref <0.031)

## 2014-08-28 LAB — GLUCOSE, POCT (MANUAL RESULT ENTRY): POC Glucose: 158 mg/dl — AB (ref 70–99)

## 2014-08-28 LAB — BRAIN NATRIURETIC PEPTIDE: B Natriuretic Peptide: 85.9 pg/mL (ref 0.0–100.0)

## 2014-08-28 LAB — TSH: TSH: 1.085 u[IU]/mL (ref 0.350–4.500)

## 2014-08-28 MED ORDER — METOPROLOL TARTRATE 12.5 MG HALF TABLET
12.5000 mg | ORAL_TABLET | Freq: Two times a day (BID) | ORAL | Status: DC
  Administered 2014-08-28 – 2014-08-29 (×2): 12.5 mg via ORAL
  Filled 2014-08-28 (×2): qty 1

## 2014-08-28 MED ORDER — APIXABAN 5 MG PO TABS
5.0000 mg | ORAL_TABLET | Freq: Two times a day (BID) | ORAL | Status: DC
  Administered 2014-08-28 – 2014-08-29 (×2): 5 mg via ORAL
  Filled 2014-08-28 (×2): qty 1

## 2014-08-28 MED ORDER — ENSURE ENLIVE PO LIQD
237.0000 mL | Freq: Two times a day (BID) | ORAL | Status: DC
  Administered 2014-08-29: 237 mL via ORAL
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
## 2014-08-28 NOTE — Progress Notes (Signed)
ANTICOAGULATION CONSULT NOTE - Initial Consult  Pharmacy Consult for Apixaban Indication: atrial fibrillation  Allergies  Allergen Reactions  . No Known Allergies     Patient Measurements: Height: 6' (182.9 cm) Weight: 174 lb (78.926 kg) IBW/kg (Calculated) : 77.6 Heparin Dosing Weight:   Vital Signs: Temp: 97.6 F (36.4 C) (04/28 1012) Temp Source: Oral (04/28 1012) BP: 107/67 mmHg (04/28 1430) Pulse Rate: 85 (04/28 1430)  Labs:  Recent Labs  08/28/14 0925 08/28/14 1030  HGB 12.6* 11.5*  HCT 38.5* 34.8*  PLT  --  178  CREATININE  --  1.20  TROPONINI  --  <0.03    Estimated Creatinine Clearance: 47.6 mL/min (by C-G formula based on Cr of 1.2).   Medical History: Past Medical History  Diagnosis Date  . Thyroid disease     Medications:   (Not in a hospital admission) Scheduled:  Infusions:   Assessment: 81yo male with history of HTN, thyroid dz and BPH presents with recent episode of dizziness and is found to be in Afib w/ RVR. Pharmacy is consulted to dose apixaban for atrial fibrillation. Hgb 11.5, Plt 178, sCr 1.2.  Goal of Therapy:  Monitor platelets by anticoagulation protocol: Yes   Plan:  Apixaban 5mg  PO BID Monitor s/sx of bleeding Educate patient on apixaban  Andrey Cota. Diona Foley, PharmD Clinical Pharmacist Pager [phone] 08/28/2014,2:48 PM

## 2014-08-28 NOTE — ED Notes (Signed)
Pt arrived to ED wearing jeans and a white thermal underwear shirt that has yellow stains and foul odor. Pt breathe also has foul odor. Pt states "i dont want to be here"

## 2014-08-28 NOTE — Care Management Note (Addendum)
    Page 1 of 2   08/29/2014     1:33:46 PM CARE MANAGEMENT NOTE 08/29/2014  Patient:  Joe [name], Joe [name]   Account Number:  [id number]  Date Initiated:  08/28/2014  Documentation initiated by:  GRAVES-BIGELOW,Millenia Waldvogel  Subjective/Objective Assessment:   Pt admitted for Dizziness- A fib.     Action/Plan:   Benefits check in process for Eliquis. Will make pt aware once completed. Will provide pt with 30 day free eliquis card. Pt will need Rx for Eliquis for 30 day free.   Anticipated DC Date:  08/29/2014   Anticipated DC Plan:  [place]  CM consult      Choice offered to / List presented to:          [facility] arranged  HH-1 RN  [place].   Status of service:  Completed, signed off Medicare Important Message given?  NO (If response is "NO", the following Medicare IM given date fields will be blank) Date Medicare IM given:   Medicare IM given by:   Date Additional Medicare IM given:   Additional Medicare IM given by:    Discharge Disposition:  [place]  Per UR Regulation:  Reviewed for med. necessity/level of care/duration of stay  If discussed at [place] of Stay Meetings, dates discussed:    Comments:  08-29-14 1234 Jacqlyn Krauss, RN,BSN [phone] CM did speak with pt in regards to Eliquis. #0 day free card provided.  Pt uses [facility] on [address]. CM will call to make sure is available and it is. CM received orders for [facility] services, RN, and PT. Pt states he still drives and takes care of [employer]. Per pt he will not need services at  d/c. CM did try to call sister Mrs. Rosana Berger, unable to get her. CM did go back to speak with pt and was finally agreeable to [facility] services. Referral made with [facility]. SOC to begin within 24-48 hrs post d/c. No further needs.   PER REP AT [facility] RX:  ELIQUIS 5MG  BID: TIER 3- $45 FOR 30 DAY SUPPLY-  NO AUTH  REQUIRED  PATIENT CAN USE MOST [facility] RET[facility]ACIES

## 2014-08-28 NOTE — ED Provider Notes (Signed)
CSN: [id number]     Arrival date & time 08/28/14  1007 History   First MD Initiated Contact with Patient 08/28/14 1011     Chief Complaint  Patient presents with  . Dizziness     (Consider location/radiation/quality/duration/timing/severity/associated sxs/prior Treatment) The history is provided by the patient and a relative. No language interpreter was used.  Joe [name] is an 81 y/o M with PMHx of thyroid disease presenting to the ED with dizziness and abnormal EKG at PCP's office earlier today. Patient was brought in by EMS. As per EMS report, it stated that PCP called for patient to be transported to ED for assessment regarding possible atrial fibrillation noted on EKG. Patient reported that he lost his wife approximately 4-5 months ago and has been living by himself. Patient reported that he has been having dizziness for the past month with intermittent falls at home, stated that at times he becomes unsteady and falls and does not know why. Sister, who accompanies patient, reported that patient has been having intermittent slurred speech, but stated that he has been at baseline. Denied chest pain, shortness of breath, difficulty breathing, head injury, numbness, tingling, difficulty swallowing, headache, neck pain, neck stiffness, fever, abdominal pain, weakness, loss of sensation. Patient and sister are poor historians.  PCP Dr. Wilson Singer  Past Medical History  Diagnosis Date  . Thyroid disease    Past Surgical History  Procedure Laterality Date  . Pars plana vitrectomy Left 03/30/2013    Procedure: PARS PLANA VITRECTOMY 25 GAUGE FOR ENDOPHTHALMITIS;  Surgeon: Hurman Horn, MD;  Location: [place];  Service: Ophthalmology;  Laterality: Left;  LOCAL RETROBULBAR,,  USE MARCAINE 0.5%   History reviewed. No pertinent family history. History  Substance Use Topics  . Smoking status: Never Smoker   . Smokeless tobacco: Not on file  . Alcohol Use: No    Review of Systems   Constitutional: Negative for fever and chills.  Eyes: Negative for visual disturbance.  Respiratory: Negative for chest tightness and shortness of breath.   Cardiovascular: Negative for chest pain.  Gastrointestinal: Negative for nausea, vomiting and abdominal pain.  Musculoskeletal: Negative for back pain, neck pain and neck stiffness.  Neurological: Positive for dizziness. Negative for weakness and numbness.      Allergies  No known allergies  Home Medications   Prior to Admission medications   Medication Sig Start Date End Date Taking? Authorizing Provider  Alogliptin-Pioglitazone (OSENI) 25-30 MG TABS Take 1 tablet by mouth daily.   Yes Historical Provider, MD  cetirizine (ZYRTEC) 10 MG tablet Take 10 mg by mouth daily as needed for allergies.   Yes Historical Provider, MD  diazepam (VALIUM) 10 MG tablet Take 10 mg by mouth 2 (two) times daily. 08/15/14  Yes Historical Provider, MD  OVER THE COUNTER MEDICATION Take 1 capsule by mouth at bedtime as needed (Constipation). Laxative   Yes Historical Provider, MD  ramipril (ALTACE) 5 MG capsule Take 5 mg by mouth daily.   Yes Historical Provider, MD  SYNTHROID 100 MCG tablet Take 100 mcg by mouth daily. 07/28/14  Yes Historical Provider, MD  tamsulosin (FLOMAX) 0.4 MG CAPS capsule Take 0.4 mg by mouth daily.   Yes Historical Provider, MD  zolpidem (AMBIEN) 10 MG tablet Take 10 mg by mouth at bedtime. 08/06/14  Yes Historical Provider, MD  mupirocin cream (BACTROBAN) 2 % Apply 1 application topically 2 (two) times daily. Patient not taking: Reported on 08/28/2014 09/13/13   Carlisle Cater, PA-C   BP  139/77 mmHg  Pulse 100  Temp(Src) 97.6 F (36.4 C) (Oral)  Resp 17  Ht 6' (1.829 m)  Wt 174 lb (78.926 kg)  BMI 23.59 kg/m2  SpO2 96% Physical Exam  Constitutional: He appears well-developed and well-nourished. No distress.  HENT:  Head: Normocephalic and atraumatic.  Mouth/Throat: Oropharynx is clear and moist. No oropharyngeal  exudate.  Eyes: Conjunctivae and EOM are normal. Right eye exhibits no discharge. Left eye exhibits no discharge.  Pinpoint pupil to the right eye   Neck: Normal range of motion. Neck supple. No tracheal deviation present.  Cardiovascular: Normal rate and normal heart sounds.  An irregular rhythm present.  Pulses:      Radial pulses are 2+ on the right side, and 2+ on the left side.       Dorsalis pedis pulses are 2+ on the right side, and 2+ on the left side.  Cap refill < 3 seconds Negative swelling or pitting edema noted to the lower extremities bilaterally   Musculoskeletal: Normal range of motion.  Lymphadenopathy:    He has no cervical adenopathy.  Neurological: He is alert. No cranial nerve deficit. He exhibits normal muscle tone. Coordination normal.  Cranial nerves III-XII grossly intact Strength 5+/5+ to upper and lower extremities bilaterally with resistance applied, equal distribution noted Equal grip strength bilaterally Negative facial droop Negative slurred speech Negative aphasia Negative arm drift Fine motor skills intact Patient is able to bring finger to nose bilaterally without difficulty or ataxia Patient follows commands well Patient's responds to questions appropriately  Skin: Skin is warm and dry. No rash noted. He is not diaphoretic. No erythema.  Psychiatric: He has a normal mood and affect. His behavior is normal. Thought content normal.  Nursing note and vitals reviewed.   ED Course  Procedures (including critical care time)  Results for orders placed or performed during the hospital encounter of 08/28/14  CBC with Differential/Platelet  Result Value Ref Range   WBC 5.9 4.0 - 10.5 K/uL   RBC 3.76 (L) 4.22 - 5.81 MIL/uL   Hemoglobin 11.5 (L) 13.0 - 17.0 g/dL   HCT 34.8 (L) 39.0 - 52.0 %   MCV 92.6 78.0 - 100.0 fL   MCH 30.6 26.0 - 34.0 pg   MCHC 33.0 30.0 - 36.0 g/dL   RDW 15.6 (H) 11.5 - 15.5 %   Platelets 178 150 - 400 K/uL   Neutrophils  Relative % 62 43 - 77 %   Neutro Abs 3.7 1.7 - 7.7 K/uL   Lymphocytes Relative 27 12 - 46 %   Lymphs Abs 1.6 0.7 - 4.0 K/uL   Monocytes Relative 8 3 - 12 %   Monocytes Absolute 0.5 0.1 - 1.0 K/uL   Eosinophils Relative 3 0 - 5 %   Eosinophils Absolute 0.2 0.0 - 0.7 K/uL   Basophils Relative 0 0 - 1 %   Basophils Absolute 0.0 0.0 - 0.1 K/uL  Comprehensive metabolic panel  Result Value Ref Range   Sodium 138 135 - 145 mmol/L   Potassium 4.2 3.5 - 5.1 mmol/L   Chloride 104 96 - 112 mmol/L   CO2 27 19 - 32 mmol/L   Glucose, Bld 157 (H) 70 - 99 mg/dL   BUN 21 6 - 23 mg/dL   Creatinine, Ser 1.20 0.50 - 1.35 mg/dL   Calcium 8.9 8.4 - 10.5 mg/dL   Total Protein 6.2 6.0 - 8.3 g/dL   Albumin 3.2 (L) 3.5 - 5.2 g/dL   AST  17 0 - 37 U/L   ALT 15 0 - 53 U/L   Alkaline Phosphatase 67 39 - 117 U/L   Total Bilirubin 0.6 0.3 - 1.2 mg/dL   GFR calc non Af Amer 53 (L) >90 mL/min   GFR calc Af Amer 61 (L) >90 mL/min   Anion gap 7 5 - 15  Troponin I  Result Value Ref Range   Troponin I <0.03 <0.031 ng/mL  Urinalysis, Routine w reflex microscopic  Result Value Ref Range   Color, Urine YELLOW YELLOW   APPearance CLEAR CLEAR   Specific Gravity, Urine 1.024 1.005 - 1.030   pH 5.0 5.0 - 8.0   Glucose, UA NEGATIVE NEGATIVE mg/dL   Hgb urine dipstick NEGATIVE NEGATIVE   Bilirubin Urine NEGATIVE NEGATIVE   Ketones, ur NEGATIVE NEGATIVE mg/dL   Protein, ur NEGATIVE NEGATIVE mg/dL   Urobilinogen, UA 0.2 0.0 - 1.0 mg/dL   Nitrite NEGATIVE NEGATIVE   Leukocytes, UA NEGATIVE NEGATIVE  Urine rapid drug screen (hosp performed)  Result Value Ref Range   Opiates NONE DETECTED NONE DETECTED   Cocaine NONE DETECTED NONE DETECTED   Benzodiazepines POSITIVE (A) NONE DETECTED   Amphetamines NONE DETECTED NONE DETECTED   Tetrahydrocannabinol NONE DETECTED NONE DETECTED   Barbiturates NONE DETECTED NONE DETECTED    Labs Review Labs Reviewed  CBC WITH DIFFERENTIAL/PLATELET - Abnormal; Notable for the  following:    RBC 3.76 (*)    Hemoglobin 11.5 (*)    HCT 34.8 (*)    RDW 15.6 (*)    All other components within normal limits  COMPREHENSIVE METABOLIC PANEL - Abnormal; Notable for the following:    Glucose, Bld 157 (*)    Albumin 3.2 (*)    GFR calc non Af Amer 53 (*)    GFR calc Af Amer 61 (*)    All other components within normal limits  URINE RAPID DRUG SCREEN (HOSP PERFORMED) - Abnormal; Notable for the following:    Benzodiazepines POSITIVE (*)    All other components within normal limits  TROPONIN I  URINALYSIS, ROUTINE W REFLEX MICROSCOPIC    Imaging Review Ct Head Wo Contrast  08/28/2014   CLINICAL DATA:  Bilateral lower extremity weakness and intermittent dizziness for 3 weeks.  EXAM: CT HEAD WITHOUT CONTRAST  TECHNIQUE: Contiguous axial images were obtained from the base of the skull through the vertex without intravenous contrast.  COMPARISON:  None.  FINDINGS: There is cortical atrophy and chronic microvascular ischemic change. No evidence of acute abnormality including hemorrhage, infarct, mass lesion, mass effect, midline shift or abnormal extra-axial fluid collection is identified. No hydrocephalus or pneumocephalus. The calvarium is intact. A small osteoma is seen in the right frontal sinus. A very small mucous retention cyst or polyp in the left maxillary sinus is also identified. Carotid atherosclerosis is noted.  IMPRESSION: No acute abnormality.  Atrophy and chronic microvascular ischemic change.   Electronically Signed   By: Inge Rise M.D.   On: 08/28/2014 11:28   Dg Chest Port 1 View  08/28/2014   CLINICAL DATA:  Dizziness  EXAM: PORTABLE CHEST - 1 VIEW  COMPARISON:  November 15, 2010  FINDINGS: There is mild atelectasis in the left base. Elsewhere lungs are clear. Heart size and pulmonary vascularity are normal. No adenopathy. There is an old healed fracture of the right posterior eighth rib.  IMPRESSION: Atelectatic change left base, mild.  No edema or  consolidation.   Electronically Signed   By: Lowella Grip III  M.D.   On: 08/28/2014 10:27     EKG Interpretation   Date/Time:  Thursday August 28 2014 10:42:28 EDT Ventricular Rate:  97 PR Interval:    QRS Duration: 127 QT Interval:  385 QTC Calculation: 489 R Axis:   72 Text Interpretation:  sinus with PACs Right bundle branch block Confirmed  by OTTER  MD, OLGA ([id number]) on 08/28/2014 11:47:52 AM       12:21 PM Patient does not want to stay. Discussed with patient plan for admission regarding changes to EKG and dizziness with frequent falls. This provider had a long discussion with patient along with sister at bedside-discussed concern of heart and possible stroke. 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 This provider discussed concern and fatality if patient is to leave-reported that patient can pass away if further workup is not done. Patient understood and like to speak with attending physician.  12:34 PM Dr. Margaretha Seeds at bedside.  Patient reported that he has been having females stay at his place for the past couple of weeks - reported that he has been giving them money regarding drug trafficing.   1:06 PM This provider spoke with [facility] - Cardiology. Discussed case, labs, imaging. Cardiology to see patient.   1:17 PM Patient agrees to be admitted.  1:38 PM This provider spoke with [facility] - Dr. Lyman Speller. Discussed case, labs, imaging, ED course in great detail. Patient to be admitted to the hospitalist  MDM   Final diagnoses:  Atrial fibrillation, unspecified  Dizziness  Frequent falls    Medications - No data to display  Filed Vitals:   08/28/14 1145 08/28/14 1200 08/28/14 1236 08/28/14 1245  BP: 139/78 144/95 148/99 139/77  Pulse: 85 97 103 100  Temp:      TempSrc:      Resp: 12 17 17 17   Height:      Weight:      SpO2: 98% 99% 100% 96%   EKG noted sinus rhythm with premature atrial complexes with an incomplete right bundle  branch block heart rate of 93. Troponin negative elevation. CBC negative elevated white blood cell count, hemoglobin 11.5, hematocrit 34.8. CMP CMP noted glucose of 157 negative elevated anion gap. Urinalysis negative for hemoglobin, nitrites, leukocytes-negative honey for infection. Urine drug screen positive for benzodiazepines. Portable chest x-ray noted at a lot of changes to left base, mild with no edema or consolidation identified. CT head no acute abnormalities, atrophy and chronic microvascular ischemic changes. Patient to be admitted for new onset atrial fibrillation with increased dizziness and recurrent falls. Imaging unremarkable. Negative elevated troponin. Cardiology consulted and to assess patient. [facility] to admit patient. Discussed plan for admission with patient who agrees. Patient stable for transfer to floor.    Jamse Mead, PA-C 08/28/14 [place], MD 08/28/14 [phone]

## 2014-08-28 NOTE — Patient Instructions (Signed)
You may have multiple reasons for your dizziness, but heart rhythm appears new.  You will need other testing at the hospital.  You should not drive until cleared by the doctors there to do so.
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
## 2014-08-28 NOTE — ED Notes (Signed)
Pt sister came out to nurses station yelling at staff, states "Get in here and help me, I can't lift him. Don't leave [place] in here like this." Told patients visitor we would gladly help her all she has to do is ask for help. Also requested that she speak with respect.

## 2014-08-28 NOTE — ED Notes (Signed)
Pt unhappy about being at ED. Pt sitting at edge of bed stating he wants to leave. Pt given a meal and beverage.

## 2014-08-29 DIAGNOSIS — I509 Heart failure, unspecified: Secondary | ICD-10-CM | POA: Diagnosis not present

## 2014-08-29 DIAGNOSIS — I4891 Unspecified atrial fibrillation: Secondary | ICD-10-CM | POA: Diagnosis not present

## 2014-08-29 LAB — GC/CHLAMYDIA PROBE AMP (~~LOC~~) NOT AT ARMC
Chlamydia: NEGATIVE
Neisseria Gonorrhea: NEGATIVE

## 2014-08-29 LAB — RPR: RPR Ser Ql: NONREACTIVE

## 2014-08-29 LAB — HIV ANTIBODY (ROUTINE TESTING W REFLEX): HIV Screen 4th Generation wRfx: NONREACTIVE

## 2014-08-29 MED ORDER — LEVOTHYROXINE SODIUM 100 MCG PO TABS: 100.0000 ug | ORAL_TABLET | Freq: Every day | ORAL | Status: DC

## 2014-08-29 MED ORDER — DIAZEPAM 2 MG PO TABS
2.0000 mg | ORAL_TABLET | Freq: Two times a day (BID) | ORAL | Status: DC
  Administered 2014-08-29: 2 mg via ORAL
  Filled 2014-08-29: qty 1

## 2014-08-29 MED ORDER — APIXABAN 5 MG PO TABS: 5.0000 mg | ORAL_TABLET | Freq: Two times a day (BID) | ORAL | Status: DC

## 2014-08-29 MED ORDER — RAMIPRIL 1.25 MG PO CAPS: 1.2500 mg | ORAL_CAPSULE | Freq: Every day | ORAL | Status: DC

## 2014-08-29 MED ORDER — METOPROLOL TARTRATE 25 MG PO TABS: 12.5000 mg | ORAL_TABLET | Freq: Two times a day (BID) | ORAL | Status: DC

## 2014-08-29 MED ORDER — TAMSULOSIN HCL 0.4 MG PO CAPS
0.4000 mg | ORAL_CAPSULE | Freq: Every day | ORAL | Status: DC
  Administered 2014-08-29: 0.4 mg via ORAL
  Filled 2014-08-29: qty 1

## 2014-08-29 MED ORDER — OFF THE BEAT BOOK
Freq: Once | Status: DC
  Filled 2014-08-29: qty 1

## 2014-08-29 NOTE — Progress Notes (Signed)
Nutrition Brief Note  Patient identified on the Malnutrition Screening Tool (MST) Report. Patient with some weight loss since his wife died 4-5 months ago. Currently eating very well. He says that he eats good at home, it's just less than he was eating when his wife was around to cook and prepare meals.  Wt Readings from Last 15 Encounters:  08/29/14 169 lb 12.8 oz (77.021 kg)  08/28/14 174 lb 3.2 oz (79.017 kg)  03/30/13 193 lb 3 oz (87.629 kg)    Body mass index is 23.02 kg/(m^2). Patient meets criteria for normal weight based on current BMI.   Current diet order is heart healthy, patient is consuming approximately 100% of meals at this time. Labs and medications reviewed.   No nutrition interventions warranted at this time. If nutrition issues arise, please consult RD.   Molli Barrows, RD, LDN, [place] Pager [phone] After Hours Pager [phone]

## 2014-08-29 NOTE — Progress Notes (Signed)
*  PRELIMINARY RESULTS* Echocardiogram 2D Echocardiogram has been performed.  Joe [name] 08/29/2014, 4:46 PM 

## 2014-08-29 NOTE — Discharge Instructions (Signed)
Follow with Primary MD Dwan Bolt, MD in 3 days, follow your GC chlamydia results   Get CBC, CMP, 2 view Chest X ray checked  by Primary MD next visit.    Activity: As tolerated with Full fall precautions use walker/cane & assistance as needed   Disposition Home     Diet: Heart Healthy  Low Carb  For Heart failure patients - Check your Weight same time everyday, if you gain over 2 pounds, or you develop in leg swelling, experience more shortness of breath or chest pain, call your Primary MD immediately. Follow Cardiac Low Salt Diet and 1.5 lit/day fluid restriction.   On your next visit with your primary care physician please Get Medicines reviewed and adjusted.   Please request your Prim.MD to go over all Hospital Tests and Procedure/Radiological results at the follow up, please get all Hospital records sent to your Prim MD by signing hospital release before you go home.   If you experience worsening of your admission symptoms, develop shortness of breath, life threatening emergency, suicidal or homicidal thoughts you must seek medical attention immediately by calling 911 or calling your MD immediately  if symptoms less severe.  You Must read complete instructions/literature along with all the possible adverse reactions/side effects for all the Medicines you take and that have been prescribed to you. Take any new Medicines after you have completely understood and accpet all the possible adverse reactions/side effects.   Do not drive, operating heavy machinery, perform activities at heights, swimming or participation in water activities or provide baby sitting services if your were admitted for syncope or siezures until you have seen by Primary MD or a Neurologist and advised to do so again.  Do not drive when taking Pain medications.    Do not take more than prescribed Pain, Sleep and Anxiety Medications  Special Instructions: If you have smoked or chewed Tobacco  in the  last 2 yrs please stop smoking, stop any regular Alcohol  and or any Recreational drug use.  Wear Seat belts while driving.   Please note  You were cared for by a hospitalist during your hospital stay. If you have any questions about your discharge medications or the care you received while you were in the hospital after you are discharged, you can call the unit and asked to speak with the hospitalist on call if the hospitalist that took care of you is not available. Once you are discharged, your primary care physician will handle any further medical issues. Please note that NO REFILLS for any discharge medications will be authorized once you are discharged, as it is imperative that you return to your primary care physician (or establish a relationship with a primary care physician if you do not have one) for your aftercare needs so that they can reassess your need for medications and monitor your lab values.          Information on my medicine - ELIQUIS (apixaban)  This medication education was reviewed with me or my healthcare representative as part of my discharge preparation.  The pharmacist that spoke with me during my hospital stay was:  Eudelia Bunch, [facility]  Why was Eliquis prescribed for you? Eliquis was prescribed for you to reduce the risk of a blood clot forming that can cause a stroke if you have a medical condition called atrial fibrillation (a type of irregular heartbeat).  What do You need to know about Eliquis ? Take your Eliquis TWICE DAILY -  one tablet in the morning and one tablet in the evening with or without food. If you have difficulty swallowing the tablet whole please discuss with your pharmacist how to take the medication safely.  Take Eliquis exactly as prescribed by your doctor and DO NOT stop taking Eliquis without talking to the doctor who prescribed the medication.  Stopping may increase your risk of developing a stroke.  Refill your prescription  before you run out.  After discharge, you should have regular check-up appointments with your healthcare provider that is prescribing your Eliquis.  In the future your dose may need to be changed if your kidney function or weight changes by a significant amount or as you get older.  What do you do if you miss a dose? If you miss a dose, take it as soon as you remember on the same day and resume taking twice daily.  Do not take more than one dose of ELIQUIS at the same time to make up a missed dose.  Important Safety Information A possible side effect of Eliquis is bleeding. You should call your healthcare provider right away if you experience any of the following: ? Bleeding from an injury or your nose that does not stop. ? Unusual colored urine (red or dark brown) or unusual colored stools (red or black). ? Unusual bruising for unknown reasons. ? A serious fall or if you hit your head (even if there is no bleeding).  Some medicines may interact with Eliquis and might increase your risk of bleeding or clotting while on Eliquis. To help avoid this, consult your healthcare provider or pharmacist prior to using any new prescription or non-prescription medications, including herbals, vitamins, non-steroidal anti-inflammatory drugs (NSAIDs) and supplements.  This website has more information on Eliquis (apixaban): http://www.eliquis.com/eliquis/home
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
## 2014-08-29 NOTE — Evaluation (Signed)
Physical Therapy Evaluation Patient Details Name: Joe [name] MRN: [id number] DOB: 12-03-1924 Today's Date: 08/29/2014   History of Present Illness  81 year old male with no previous cardiac issues, history of hypothyroidism, possible diabetes, was admitted 04/28 with atrial fibrillation  Clinical Impression  Pt admitted with above diagnosis. Pt currently with functional limitations due to the deficits listed below (see PT Problem List). Pt would benefit from RW for extra stability but pt refuses.  Pt also refuses HHPT f/u at present.  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  Tried to provide some help for pt but he is refusing.  HHSW and HHRN might be beneficial as well.  Will follow acutely. Pt will benefit from skilled PT to increase their independence and safety with mobility to allow discharge to the venue listed below.      Follow Up Recommendations Home health PT;Supervision/Assistance - 24 hour    Equipment Recommendations  Rolling walker with 5" wheels    Recommendations for Other Services       Precautions / Restrictions Precautions Precautions: Fall Restrictions Weight Bearing Restrictions: No      Mobility  Bed Mobility Overal bed mobility: Independent                Transfers Overall transfer level: Needs assistance Equipment used: None Transfers: Sit to/from Stand Sit to Stand: Supervision         General transfer comment: Supervision of safety secondary to impulsive at times.   Ambulation/Gait Ambulation/Gait assistance: Min guard Ambulation Distance (Feet): 350 Feet Assistive device: None Gait Pattern/deviations: Staggering right;Step-through pattern;Decreased stride length   Gait velocity interpretation: <1.8 ft/sec, indicative of risk for recurrent falls General Gait Details: Pt generally steady if not challenged.  had more difficulty with challenges but refuses  RW.  Feel that pt is probably close to baseline.  Occasionally staggers to right with pt self correcting but takes a few side steps to correct.  Fall risk but pt again refusing RW.   Stairs Stairs: Yes Stairs assistance: Min guard Stair Management: Alternating pattern;One rail Right;Forwards Number of Stairs: 5 General stair comments: min guard assist for safety.  HAs no steps at home but practiced anyway  Wheelchair Mobility    Modified Rankin (Stroke Patients Only)       Balance Overall balance assessment: Needs assistance Sitting-balance support: No upper extremity supported;Feet supported Sitting balance-Leahy Scale: Good     Standing balance support: No upper extremity supported;During functional activity Standing balance-Leahy Scale: Fair Standing balance comment: can withstand min challenges to balance but not mod challenges                 Standardized Balance Assessment Standardized Balance Assessment : Dynamic Gait Index   Dynamic Gait Index Level Surface: Normal Change in Gait Speed: Normal Gait with Horizontal Head Turns: Mild Impairment Gait with Vertical Head Turns: Mild Impairment Gait and Pivot Turn: Mild Impairment Step Over Obstacle: Mild Impairment Step Around Obstacles: Mild Impairment Steps: Mild Impairment Total Score: 18       Pertinent Vitals/Pain Pain Assessment: No/denies pain  VSS    Home Living Family/patient expects to be discharged to:: Private residence Living Arrangements: Alone   Type of Home: House Home Access: Level entry     Home Layout: One level Home Equipment: None Additional Comments: 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  Also  has 30 rental homes.     Prior Function Level of Independence: Independent               Hand Dominance   Dominant Hand: Right    Extremity/Trunk Assessment   Upper Extremity Assessment: Defer to OT evaluation            Lower Extremity Assessment: Generalized weakness         Communication   Communication: No difficulties  Cognition Arousal/Alertness: Awake/alert Behavior During Therapy: Anxious Overall Cognitive Status: Impaired/Different from baseline Area of Impairment: Safety/judgement;Awareness;Problem solving         Safety/Judgement: Decreased awareness of safety Awareness: Intellectual   General Comments: Pt with generally poor safety awareness.     General Comments General comments (skin integrity, edema, etc.): Pt scored 18/24 on DGI predicting risk of falls without device.     Exercises        Assessment/Plan    PT Assessment Patient needs continued PT services  PT Diagnosis Generalized weakness   PT Problem List Decreased balance;Decreased activity tolerance;Decreased knowledge of use of DME;Decreased safety awareness;Decreased knowledge of precautions;Decreased coordination;Decreased mobility  PT Treatment Interventions DME instruction;Gait training;Functional mobility training;Therapeutic activities;Therapeutic exercise;Balance training;Patient/family education;Stair training   PT Goals (Current goals can be found in the Care Plan section) Acute Rehab PT Goals Patient Stated Goal: to go home now PT Goal Formulation: With patient Time For Goal Achievement: 09/05/14 Potential to Achieve Goals: Good    Frequency Min 3X/week   Barriers to discharge Decreased caregiver support      Co-evaluation               End of Session Equipment Utilized During Treatment: Gait belt Activity Tolerance: Patient limited by fatigue Patient left: in bed;with call bell/phone within reach Nurse Communication: Mobility status    Functional Assessment Tool Used: clinical judgment Functional Limitation: Mobility: Walking and moving around Mobility: Walking and Moving Around Current Status ([id number]): At least 1 percent but less than 20 percent impaired, limited or  restricted Mobility: Walking and Moving Around Goal Status [phone]): At least 1 percent but less than 20 percent impaired, limited or restricted Mobility: Walking and Moving Around Discharge Status [phone]): At least 1 percent but less than 20 percent impaired, limited or restricted    Time: 1454-1505 PT Time Calculation (min) (ACUTE ONLY): 11 min   Charges:   PT Evaluation $Initial PT Evaluation Tier I: 1 Procedure     PT G Codes:   PT G-Codes **NOT FOR INPATIENT CLASS** Functional Assessment Tool Used: clinical judgment Functional Limitation: Mobility: Walking and moving around Mobility: Walking and Moving Around Current Status ([id number]): At least 1 percent but less than 20 percent impaired, limited or restricted Mobility: Walking and Moving Around Goal Status [phone]): At least 1 percent but less than 20 percent impaired, limited or restricted Mobility: Walking and Moving Around Discharge Status [phone]): At least 1 percent but less than 20 percent impaired, limited or restricted    Denice Paradise 08/29/2014, 4:56 PM [facility] Acute Rehabilitation [phone] (pager)

## 2014-08-29 NOTE — Progress Notes (Signed)
UR completed 

## 2014-08-29 NOTE — Progress Notes (Signed)
Patient Name: Joe [name] Date of Encounter: 08/29/2014  Principal Problem:   Atrial fibrillation, new onset Active Problems:   Hypothyroidism   Essential hypertension   Dizziness   BPH (benign prostatic hypertrophy)   Diabetes mellitus with no complication   Risky sexual behavior   A-fib   Primary Cardiologist: Dr. [name], new  Patient Profile: 81 year old male with no previous cardiac issues, history of hypothyroidism, possible diabetes, was admitted 04/28 with atrial fibrillation  SUBJECTIVE: No palpitations, no awareness of irregular heart rate. 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 In the last 6 months, he has really struggled. They have no children, no close relatives to help.  The lightheaded spells will resolve in less than a minute, he has not fallen.   BP lying 122/62, heart rate 104  sitting 115/67 with heart rate 94   standing 108/62 with heart rate 101  OBJECTIVE Filed Vitals:   08/28/14 2036 08/28/14 2351 08/29/14 0426 08/29/14 0734  BP: 101/54 125/80 117/71 101/58  Pulse: 91 90 92 90  Temp: 97.7 F (36.5 C) 97.9 F (36.6 C) 98.1 F (36.7 C) 98.1 F (36.7 C)  TempSrc: Oral Oral Oral Oral  Resp:  18 20 19   Height:   6' (1.829 m)   Weight:   169 lb 12.8 oz (77.021 kg)   SpO2: 96% 98% 95% 96%    Intake/Output Summary (Last 24 hours) at 08/29/14 0940 Last data filed at 08/29/14 0700  Gross per 24 hour  Intake    120 ml  Output      0 ml  Net    120 ml   Filed Weights   08/28/14 1010 08/29/14 0426  Weight: 174 lb (78.926 kg) 169 lb 12.8 oz (77.021 kg)    PHYSICAL EXAM General: Well developed, well nourished, male in no acute distress. Head: Normocephalic, atraumatic.  Neck: Supple without bruits, JVD not elevated. Lungs:  Resp regular and unlabored, few dry rales, good air exchange. Heart:  irregularly irregular , S1, S2, no S3, S4, or murmur; no rub. Abdomen: Soft,  non-tender, non-distended, BS + x 4.  Extremities: No clubbing, cyanosis,  No edema.  Neuro: Alert and oriented X 3. Moves all extremities spontaneously. Psych: Normal affect.  LABS: CBC: Recent Labs  08/28/14 0925 08/28/14 1030  WBC 6.7 5.9  NEUTROABS  --  3.7  HGB 12.6* 11.5*  HCT 38.5* 34.8*  MCV 92.7 92.6  PLT  --  748   Basic Metabolic Panel: Recent Labs  08/28/14 1030  NA 138  K 4.2  CL 104  CO2 27  GLUCOSE 157*  BUN 21  CREATININE 1.20  CALCIUM 8.9   Liver Function Tests: Recent Labs  08/28/14 1030  AST 17  ALT 15  ALKPHOS 67  BILITOT 0.6  PROT 6.2  ALBUMIN 3.2*   Cardiac Enzymes: Recent Labs  08/28/14 1030  TROPONINI <0.03   BNP:  B NATRIURETIC PEPTIDE  Date/Time Value Ref Range Status  08/28/2014 06:45 PM 85.9 0.0 - 100.0 pg/mL Final   Thyroid Function Tests: Recent Labs  08/28/14 1845  TSH 1.085   TELE:  Atrial fibrillation, rate control gradually improved overnight      Radiology/Studies: Ct Head Wo Contrast 08/28/2014   CLINICAL DATA:  Bilateral lower extremity weakness and intermittent dizziness for 3 weeks.  EXAM: CT HEAD WITHOUT CONTRAST  TECHNIQUE: Contiguous axial images were obtained from the base of  the skull through the vertex without intravenous contrast.  COMPARISON:  None.  FINDINGS: There is cortical atrophy and chronic microvascular ischemic change. No evidence of acute abnormality including hemorrhage, infarct, mass lesion, mass effect, midline shift or abnormal extra-axial fluid collection is identified. No hydrocephalus or pneumocephalus. The calvarium is intact. A small osteoma is seen in the right frontal sinus. A very small mucous retention cyst or polyp in the left maxillary sinus is also identified. Carotid atherosclerosis is noted.  IMPRESSION: No acute abnormality.  Atrophy and chronic microvascular ischemic change.   Electronically Signed   By: Inge Rise M.D.   On: 08/28/2014 11:28   Dg Chest Port 1  View 08/28/2014   CLINICAL DATA:  Dizziness  EXAM: PORTABLE CHEST - 1 VIEW  COMPARISON:  November 15, 2010  FINDINGS: There is mild atelectasis in the left base. Elsewhere lungs are clear. Heart size and pulmonary vascularity are normal. No adenopathy. There is an old healed fracture of the right posterior eighth rib.  IMPRESSION: Atelectatic change left base, mild.  No edema or consolidation.   Electronically Signed   By: Lowella Grip III M.D.   On: 08/28/2014 10:27     Current Medications:  . apixaban  5 mg Oral BID  . diazepam  2 mg Oral BID  . feeding supplement (ENSURE ENLIVE)  237 mL Oral BID BM  . [START ON 08/30/2014] levothyroxine  100 mcg Oral QAC breakfast  . metoprolol tartrate  12.5 mg Oral BID  . tamsulosin  0.4 mg Oral Daily      ASSESSMENT AND PLAN: Principal Problem:   Atrial fibrillation, new onset - If he is asymptomatic with rate control, think that is the best option - Beta blocker is new, blood pressure is soft so do not feel he will tolerate a higher dose - This patients CHA2DS2-VASc Score and unadjusted Ischemic Stroke Rate (% per year) is equal to 4.8 % stroke rate/year from a score of 4  - started on apixaban  Otherwise, per IM, TSH is normal; social situation is troublesome Active Problems:   Hypothyroidism   Essential hypertension   Dizziness   BPH (benign prostatic hypertrophy)   Diabetes mellitus with no complication   Risky sexual behavior   A-fib   Jonetta Speak , PA-C 9:40 AM 08/29/2014  Patient seen, examined. Available data reviewed. Agree with findings, assessment, and plan as outlined by Rosaria Ferries, PA-C. Exam reveals elderly somewhat disheveled male in NAD. Lungs CTA, heart irregularly irregular without murmur. Trace edema. I have reviewed lab and radiographic data. He seems to be tolerating atrial fibrillation without problems. 2D Echo is pending. Eliquis started for anticoagulation. Cardiology team will follow-up on echo result  but if unremarkable I wouldn't anticipate further changes.  Sherren Mocha, M.D. 08/29/2014 3:16 PM

## 2014-09-03 DIAGNOSIS — I4891 Unspecified atrial fibrillation: Secondary | ICD-10-CM | POA: Diagnosis not present

## 2014-09-03 DIAGNOSIS — N400 Enlarged prostate without lower urinary tract symptoms: Secondary | ICD-10-CM | POA: Diagnosis not present

## 2014-09-03 DIAGNOSIS — E032 Hypothyroidism due to medicaments and other exogenous substances: Secondary | ICD-10-CM | POA: Diagnosis not present

## 2014-09-04 ENCOUNTER — Telehealth: Payer: Self-pay | Admitting: Cardiovascular Disease

## 2014-09-04 NOTE — Progress Notes (Signed)
Sister called concerned she was not able to get an appt with Cardiology before 09/16/14. I called and got pt appt with Cecilie Kicks NP on 09/08/14 at 0830. I notified sister of new appt. Carroll Kinds RN

## 2014-09-04 NOTE — Telephone Encounter (Signed)
Pt refused Physical Therapy.

## 2014-09-08 ENCOUNTER — Ambulatory Visit (INDEPENDENT_AMBULATORY_CARE_PROVIDER_SITE_OTHER): Payer: Medicare Other | Admitting: Cardiology

## 2014-09-08 ENCOUNTER — Encounter: Payer: Self-pay | Admitting: Cardiology

## 2014-09-08 VITALS — BP 122/76 | HR 83 | Ht 72.0 in | Wt 172.7 lb

## 2014-09-08 DIAGNOSIS — I4891 Unspecified atrial fibrillation: Secondary | ICD-10-CM | POA: Diagnosis not present

## 2014-09-08 NOTE — Patient Instructions (Signed)
Your physician recommends that you schedule a follow-up appointment in: [place] physician recommends that you schedule a follow-up appointment in: [place] METOPROLOL TWICE DAILY  CALL IF CONTINUE TO GET DIZZY

## 2014-09-08 NOTE — Progress Notes (Signed)
Cardiology Office Note   Date:  09/08/2014   ID:  JOSEANTONIO [name], DOB [birth year], MRN [id number]  PCP:  Dwan Bolt, MD  Cardiologist:  Dr. [name]    Chief Complaint  Patient presents with  . Atrial Fibrillation    follow up hospital      History of Present Illness: Joe [name] is a 81 y.o. male who presents for follow up of his a fib diagnosed 08/28/14 with hospitalization.  With CHA2DS2VasC  Score of 4 he is on eliquis but only taking once a day along with lopressor once a day.  It was decided to leave in a fib as pt asymptomatic.  He does have a rare episode of dizziness.  This occurred prior to hospitalization.  Today pt has no complaints and does not believe he has a fib.    We discussed at length atrial fib.  We also discussed his meds.  He has one episode of dizziness since discharge. We discussed his wearing a heart monitor but he is currently not interested.  He has had poor living arrangement but he has changed his locks and undesirable people are no longer in his house.  He is eating at [employer] one meal a day.  He fixes his own BK and sandwich for lunch.       Past Medical History  Diagnosis Date  . Thyroid disease   . Anxiety   . Hypertension     [name] 08/28/2014  . Hypothyroidism     [name] 08/28/2014  . BPH (benign prostatic hypertrophy)     [name] 08/28/2014  . Atrial fibrillation with RVR 08/28/2014    [name] 08/28/2014    Past Surgical History  Procedure Laterality Date  . Pars plana vitrectomy Left 03/30/2013    Procedure: PARS PLANA VITRECTOMY 25 GAUGE FOR ENDOPHTHALMITIS;  Surgeon: Hurman Horn, MD;  Location: [facility];  Service: Ophthalmology;  Laterality: Left;  LOCAL RETROBULBAR,,  USE MARCAINE 0.5%     Current Outpatient Prescriptions  Medication Sig Dispense Refill  . Alogliptin-Pioglitazone (OSENI) 25-30 MG TABS Take 1 tablet by mouth daily.    Marland Kitchen apixaban (ELIQUIS) 5 MG TABS tablet Take 1 tablet (5 mg total) by mouth 2 (two) times daily.  60 tablet 0  . cetirizine (ZYRTEC) 10 MG tablet Take 10 mg by mouth daily as needed for allergies.    . diazepam (VALIUM) 10 MG tablet Take 10 mg by mouth 2 (two) times daily.  2  . metoprolol tartrate (LOPRESSOR) 25 MG tablet Take 0.5 tablets (12.5 mg total) by mouth 2 (two) times daily. 60 tablet 0  . mupirocin cream (BACTROBAN) 2 % Apply 1 application topically 2 (two) times daily. 15 g 0  . OVER THE COUNTER MEDICATION Take 1 capsule by mouth at bedtime as needed (Constipation). Laxative    . ramipril (ALTACE) 1.25 MG capsule Take 1 capsule (1.25 mg total) by mouth daily. 30 capsule 0  . SYNTHROID 100 MCG tablet Take 100 mcg by mouth daily.  12  . tamsulosin (FLOMAX) 0.4 MG CAPS capsule Take 0.4 mg by mouth daily.     No current facility-administered medications for this visit.   Facility-Administered Medications Ordered in Other Visits  Medication Dose Route Frequency Provider Last Rate Last Dose  . cyclopentolate (CYCLODRYL,CYCLOGYL) 1 % ophthalmic solution 1 drop  1 drop Left Eye PRN Hurman Horn, MD      . gatifloxacin (ZYMAXID) 0.5 % ophthalmic drops 1 drop  1 drop Left Eye  PRN Hurman Horn, MD      . phenylephrine (MYDFRIN) 2.5 % ophthalmic solution 1 drop  1 drop Left Eye PRN Hurman Horn, MD        Allergies:   No known allergies    Social History:  The patient  reports that he has never smoked. He has never used smokeless tobacco. He reports that he does not drink alcohol or use illicit drugs.   Family History:  The patient's family history includes Hyperlipidemia in his sister.    ROS:  General:no colds or fevers, no weight changes Skin:no rashes or ulcers HEENT:no blurred vision, no congestion CV:see HPI PUL:see HPI GI:no diarrhea constipation or melena, no indigestion GU:no hematuria, no dysuria MS:no joint pain, no claudication Neuro:no syncope, one episode lightheadedness since discharge Endo:no diabetes, + thyroid disease  Wt Readings from Last 3 Encounters:   09/08/14 172 lb 11.2 oz (78.336 kg)  08/29/14 169 lb 12.8 oz (77.021 kg)  08/28/14 174 lb 3.2 oz (79.017 kg)     PHYSICAL EXAM: VS:  BP 122/76 mmHg  Pulse 83  Ht 6' (1.829 m)  Wt 172 lb 11.2 oz (78.336 kg)  BMI 23.42 kg/m2 , BMI Body mass index is 23.42 kg/(m^2). General:Pleasant affect, NAD Skin:Warm and dry, brisk capillary refill HEENT:normocephalic, sclera clear, mucus membranes moist, hard of hearing Neck:supple, no JVD, no bruits  Heart:irreg irreg without murmur, gallup, rub or click Lungs:clear without rales, rhonchi, or wheezes OEV:OJJK, non tender, + BS, do not palpate liver spleen or masses Ext:no lower ext edema, 2+ pedal pulses, 2+ radial pulses Neuro:alert and oriented  3, MAE, follows commands, + facial symmetry    EKG:  EKG is ordered today. The ekg ordered today demonstrates Atrial fib with RBBB no acute changes from hospital.     Recent Labs: 08/28/2014: ALT 15; B Natriuretic Peptide 85.9; BUN 21; Creatinine 1.20; Hemoglobin 11.5*; Platelets 178; Potassium 4.2; Sodium 138; TSH 1.085    Lipid Panel No results found for: CHOL, TRIG, HDL, CHOLHDL, VLDL, LDLCALC, LDLDIRECT     Other studies Reviewed: Additional studies/ records that were reviewed today include: hospital note, echo.   ASSESSMENT AND PLAN:  1.  A fib - echo with EF 55-60%, G1DD, lt atrium is mildly dilated, rate controlled, pt unaware he has irreg HR and at times denies he has. Will have him follow up in 4 months with APP and then with Dr. Corky Downs in July.    2. Anticoagulation CHADSVASC=4 no bleeding  3. Lightheadedness.  One episode since discharge, I discussed an outpt monitor but pt does not wish to wear at this time.  If more episode of a fib he will notify us for the monitor.  Current medicines are reviewed with the patient today.  The patient Has no concerns regarding medicines, though was not taking eliquis and lopressor approp. .    The following changes have been made:  See  above Labs/ tests ordered today include:see above  Disposition:   FU:  see above  Signed, Isaiah Serge, NP  09/08/2014 9:25 AM    [facility] [place], [place] [place], [place] Phone: [phone]; Fax: [phone]

## 2014-10-01 ENCOUNTER — Telehealth: Payer: Self-pay | Admitting: Cardiology

## 2014-10-01 NOTE — Telephone Encounter (Signed)
Katrina from [facility] called - no lab order in - I reviewed chart, could not locate recent order or request for labs. Advised of this, understanding verbalized. Spoke to patient, confirmed appt time w/ [place] on 6-24.

## 2014-10-03 DIAGNOSIS — E789 Disorder of lipoprotein metabolism, unspecified: Secondary | ICD-10-CM | POA: Diagnosis not present

## 2014-10-03 DIAGNOSIS — Z0000 Encounter for general adult medical examination without abnormal findings: Secondary | ICD-10-CM | POA: Diagnosis not present

## 2014-10-03 DIAGNOSIS — E118 Type 2 diabetes mellitus with unspecified complications: Secondary | ICD-10-CM | POA: Diagnosis not present

## 2014-10-03 DIAGNOSIS — Z125 Encounter for screening for malignant neoplasm of prostate: Secondary | ICD-10-CM | POA: Diagnosis not present

## 2014-10-03 DIAGNOSIS — E032 Hypothyroidism due to medicaments and other exogenous substances: Secondary | ICD-10-CM | POA: Diagnosis not present

## 2014-10-07 DIAGNOSIS — Z85828 Personal history of other malignant neoplasm of skin: Secondary | ICD-10-CM | POA: Diagnosis not present

## 2014-10-07 DIAGNOSIS — Z08 Encounter for follow-up examination after completed treatment for malignant neoplasm: Secondary | ICD-10-CM | POA: Diagnosis not present

## 2014-10-08 DIAGNOSIS — I4891 Unspecified atrial fibrillation: Secondary | ICD-10-CM | POA: Diagnosis not present

## 2014-10-08 DIAGNOSIS — E039 Hypothyroidism, unspecified: Secondary | ICD-10-CM | POA: Diagnosis not present

## 2014-10-08 DIAGNOSIS — I10 Essential (primary) hypertension: Secondary | ICD-10-CM | POA: Diagnosis not present

## 2014-10-24 ENCOUNTER — Ambulatory Visit: Payer: Medicare Other | Admitting: Cardiology

## 2014-11-10 ENCOUNTER — Emergency Department (HOSPITAL_COMMUNITY): Payer: Medicare Other

## 2014-11-10 ENCOUNTER — Ambulatory Visit: Payer: Medicare Other | Admitting: Cardiovascular Disease

## 2014-11-10 ENCOUNTER — Encounter (HOSPITAL_COMMUNITY): Payer: Self-pay | Admitting: Emergency Medicine

## 2014-11-10 ENCOUNTER — Observation Stay (HOSPITAL_COMMUNITY)
Admission: EM | Admit: 2014-11-10 | Discharge: 2014-11-12 | Disposition: A | Discharge status: Home / Self Care | Payer: Medicare Other | Attending: Internal Medicine | Admitting: Internal Medicine

## 2014-11-10 DIAGNOSIS — S0181XA Laceration without foreign body of other part of head, initial encounter: Secondary | ICD-10-CM | POA: Insufficient documentation

## 2014-11-10 DIAGNOSIS — Y998 Other external cause status: Secondary | ICD-10-CM | POA: Diagnosis not present

## 2014-11-10 DIAGNOSIS — E119 Type 2 diabetes mellitus without complications: Secondary | ICD-10-CM | POA: Diagnosis not present

## 2014-11-10 DIAGNOSIS — I4891 Unspecified atrial fibrillation: Secondary | ICD-10-CM | POA: Diagnosis not present

## 2014-11-10 DIAGNOSIS — E039 Hypothyroidism, unspecified: Secondary | ICD-10-CM | POA: Diagnosis not present

## 2014-11-10 DIAGNOSIS — Y9289 Other specified places as the place of occurrence of the external cause: Secondary | ICD-10-CM | POA: Insufficient documentation

## 2014-11-10 DIAGNOSIS — I951 Orthostatic hypotension: Secondary | ICD-10-CM | POA: Diagnosis present

## 2014-11-10 DIAGNOSIS — I6789 Other cerebrovascular disease: Secondary | ICD-10-CM | POA: Diagnosis not present

## 2014-11-10 DIAGNOSIS — R55 Syncope and collapse: Secondary | ICD-10-CM | POA: Diagnosis not present

## 2014-11-10 DIAGNOSIS — F419 Anxiety disorder, unspecified: Secondary | ICD-10-CM | POA: Insufficient documentation

## 2014-11-10 DIAGNOSIS — Z79899 Other long term (current) drug therapy: Secondary | ICD-10-CM | POA: Diagnosis not present

## 2014-11-10 DIAGNOSIS — N400 Enlarged prostate without lower urinary tract symptoms: Secondary | ICD-10-CM | POA: Diagnosis not present

## 2014-11-10 DIAGNOSIS — Y9389 Activity, other specified: Secondary | ICD-10-CM | POA: Insufficient documentation

## 2014-11-10 DIAGNOSIS — W1839XA Other fall on same level, initial encounter: Secondary | ICD-10-CM | POA: Diagnosis not present

## 2014-11-10 DIAGNOSIS — R4189 Other symptoms and signs involving cognitive functions and awareness: Secondary | ICD-10-CM | POA: Clinically undetermined

## 2014-11-10 DIAGNOSIS — S069X9A Unspecified intracranial injury with loss of consciousness of unspecified duration, initial encounter: Secondary | ICD-10-CM | POA: Diagnosis not present

## 2014-11-10 DIAGNOSIS — S299XXA Unspecified injury of thorax, initial encounter: Secondary | ICD-10-CM | POA: Diagnosis not present

## 2014-11-10 DIAGNOSIS — W102XXA Fall (on)(from) incline, initial encounter: Secondary | ICD-10-CM

## 2014-11-10 DIAGNOSIS — S023XXA Fracture of orbital floor, initial encounter for closed fracture: Secondary | ICD-10-CM | POA: Diagnosis not present

## 2014-11-10 DIAGNOSIS — F4489 Other dissociative and conversion disorders: Secondary | ICD-10-CM | POA: Diagnosis not present

## 2014-11-10 DIAGNOSIS — W19XXXA Unspecified fall, initial encounter: Secondary | ICD-10-CM

## 2014-11-10 DIAGNOSIS — E079 Disorder of thyroid, unspecified: Secondary | ICD-10-CM | POA: Insufficient documentation

## 2014-11-10 DIAGNOSIS — I10 Essential (primary) hypertension: Secondary | ICD-10-CM | POA: Insufficient documentation

## 2014-11-10 DIAGNOSIS — F4323 Adjustment disorder with mixed anxiety and depressed mood: Secondary | ICD-10-CM | POA: Clinically undetermined

## 2014-11-10 DIAGNOSIS — S199XXA Unspecified injury of neck, initial encounter: Secondary | ICD-10-CM | POA: Diagnosis not present

## 2014-11-10 LAB — CBC WITH DIFFERENTIAL/PLATELET
Basophils Absolute: 0 K/uL (ref 0.0–0.1)
Basophils Relative: 0 % (ref 0–1)
Eosinophils Absolute: 0.1 K/uL (ref 0.0–0.7)
Eosinophils Relative: 1 % (ref 0–5)
HCT: 38.5 % — ABNORMAL LOW (ref 39.0–52.0)
Hemoglobin: 13.1 g/dL (ref 13.0–17.0)
Lymphocytes Relative: 19 % (ref 12–46)
Lymphs Abs: 1.8 K/uL (ref 0.7–4.0)
MCH: 31.2 pg (ref 26.0–34.0)
MCHC: 34 g/dL (ref 30.0–36.0)
MCV: 91.7 fL (ref 78.0–100.0)
Monocytes Absolute: 0.8 K/uL (ref 0.1–1.0)
Monocytes Relative: 9 % (ref 3–12)
Neutro Abs: 6.8 K/uL (ref 1.7–7.7)
Neutrophils Relative %: 71 % (ref 43–77)
Platelets: 191 K/uL (ref 150–400)
RBC: 4.2 MIL/uL — ABNORMAL LOW (ref 4.22–5.81)
RDW: 15.3 % (ref 11.5–15.5)
WBC: 9.6 K/uL (ref 4.0–10.5)

## 2014-11-10 LAB — COMPREHENSIVE METABOLIC PANEL WITH GFR
ALT: 12 U/L — ABNORMAL LOW (ref 17–63)
AST: 17 U/L (ref 15–41)
Albumin: 3.3 g/dL — ABNORMAL LOW (ref 3.5–5.0)
Alkaline Phosphatase: 60 U/L (ref 38–126)
Anion gap: 8 (ref 5–15)
BUN: 32 mg/dL — ABNORMAL HIGH (ref 6–20)
CO2: 25 mmol/L (ref 22–32)
Calcium: 9 mg/dL (ref 8.9–10.3)
Chloride: 102 mmol/L (ref 101–111)
Creatinine, Ser: 1.74 mg/dL — ABNORMAL HIGH (ref 0.61–1.24)
GFR calc Af Amer: 39 mL/min — ABNORMAL LOW
GFR calc non Af Amer: 34 mL/min — ABNORMAL LOW
Glucose, Bld: 182 mg/dL — ABNORMAL HIGH (ref 65–99)
Potassium: 3.9 mmol/L (ref 3.5–5.1)
Sodium: 135 mmol/L (ref 135–145)
Total Bilirubin: 0.5 mg/dL (ref 0.3–1.2)
Total Protein: 6.3 g/dL — ABNORMAL LOW (ref 6.5–8.1)

## 2014-11-10 LAB — RAPID URINE DRUG SCREEN, HOSP PERFORMED
AMPHETAMINES: NOT DETECTED
Barbiturates: NOT DETECTED
Benzodiazepines: POSITIVE — AB
COCAINE: NOT DETECTED
Opiates: NOT DETECTED
Tetrahydrocannabinol: NOT DETECTED

## 2014-11-10 LAB — PROTIME-INR
INR: 1.35 (ref 0.00–1.49)
Prothrombin Time: 16.8 s — ABNORMAL HIGH (ref 11.6–15.2)

## 2014-11-10 LAB — I-STAT TROPONIN, ED: Troponin i, poc: 0 ng/mL (ref 0.00–0.08)

## 2014-11-10 LAB — GLUCOSE, CAPILLARY
Glucose-Capillary: 126 mg/dL — ABNORMAL HIGH (ref 65–99)
Glucose-Capillary: 189 mg/dL — ABNORMAL HIGH (ref 65–99)

## 2014-11-10 LAB — ETHANOL: Alcohol, Ethyl (B): <5 mg/dL

## 2014-11-10 MED ORDER — ACETAMINOPHEN 325 MG PO TABS: 650.0000 mg | ORAL_TABLET | Freq: Four times a day (QID) | ORAL | Status: DC | PRN

## 2014-11-10 MED ORDER — SODIUM CHLORIDE 0.9 % IJ SOLN: 3.0000 mL | Freq: Two times a day (BID) | INTRAMUSCULAR | Status: DC

## 2014-11-10 MED ORDER — POTASSIUM CHLORIDE IN NACL 20-0.9 MEQ/L-% IV SOLN
INTRAVENOUS | Status: DC
  Administered 2014-11-10: 1000 mL via INTRAVENOUS
  Administered 2014-11-11 (×2): 75 mL/h via INTRAVENOUS
  Administered 2014-11-12: 10:00:00 via INTRAVENOUS
  Filled 2014-11-10 (×6): qty 1000

## 2014-11-10 MED ORDER — METOPROLOL TARTRATE 12.5 MG HALF TABLET
12.5000 mg | ORAL_TABLET | Freq: Two times a day (BID) | ORAL | Status: DC
  Filled 2014-11-10: qty 1

## 2014-11-10 MED ORDER — ONDANSETRON HCL 4 MG PO TABS: 4.0000 mg | ORAL_TABLET | Freq: Four times a day (QID) | ORAL | Status: DC | PRN

## 2014-11-10 MED ORDER — LIDOCAINE HCL (PF) 1 % IJ SOLN: 5.0000 mL | Freq: Once | INTRAMUSCULAR | Status: DC

## 2014-11-10 MED ORDER — APIXABAN 2.5 MG PO TABS
2.5000 mg | ORAL_TABLET | Freq: Two times a day (BID) | ORAL | Status: DC
  Administered 2014-11-11: 2.5 mg via ORAL
  Filled 2014-11-10 (×2): qty 1

## 2014-11-10 MED ORDER — ACETAMINOPHEN 650 MG RE SUPP: 650.0000 mg | Freq: Four times a day (QID) | RECTAL | Status: DC | PRN

## 2014-11-10 MED ORDER — LIDOCAINE HCL (PF) 1 % IJ SOLN
INTRAMUSCULAR | Status: AC
  Filled 2014-11-10: qty 5

## 2014-11-10 MED ORDER — TAMSULOSIN HCL 0.4 MG PO CAPS
0.4000 mg | ORAL_CAPSULE | Freq: Every day | ORAL | Status: DC
  Administered 2014-11-11 – 2014-11-12 (×2): 0.4 mg via ORAL
  Filled 2014-11-10 (×3): qty 1

## 2014-11-10 MED ORDER — FENTANYL CITRATE (PF) 100 MCG/2ML IJ SOLN: 25.0000 ug | INTRAMUSCULAR | Status: DC | PRN

## 2014-11-10 MED ORDER — RAMIPRIL 1.25 MG PO CAPS
1.2500 mg | ORAL_CAPSULE | Freq: Every day | ORAL | Status: DC
  Filled 2014-11-10 (×2): qty 1

## 2014-11-10 MED ORDER — LEVOTHYROXINE SODIUM 100 MCG PO TABS
100.0000 ug | ORAL_TABLET | Freq: Every day | ORAL | Status: DC
  Administered 2014-11-11 – 2014-11-12 (×2): 100 ug via ORAL
  Filled 2014-11-10 (×2): qty 1

## 2014-11-10 MED ORDER — INSULIN ASPART 100 UNIT/ML ~~LOC~~ SOLN: 0.0000 [IU] | Freq: Every day | SUBCUTANEOUS | Status: DC

## 2014-11-10 MED ORDER — INSULIN ASPART 100 UNIT/ML ~~LOC~~ SOLN
0.0000 [IU] | Freq: Three times a day (TID) | SUBCUTANEOUS | Status: DC
Start: 2014-11-10 — End: 2014-11-12
  Administered 2014-11-10 – 2014-11-11 (×2): 1 [IU] via SUBCUTANEOUS
  Administered 2014-11-11: 2 [IU] via SUBCUTANEOUS
  Administered 2014-11-11: 1 [IU] via SUBCUTANEOUS
  Administered 2014-11-12: 2 [IU] via SUBCUTANEOUS
  Administered 2014-11-12: 1 [IU] via SUBCUTANEOUS

## 2014-11-10 MED ORDER — ONDANSETRON HCL 4 MG/2ML IJ SOLN: 4.0000 mg | Freq: Four times a day (QID) | INTRAMUSCULAR | Status: DC | PRN

## 2014-11-10 MED ORDER — SODIUM CHLORIDE 0.9 % IV BOLUS (SEPSIS)
1000.0000 mL | Freq: Once | INTRAVENOUS | Status: AC
  Administered 2014-11-10: 1000 mL via INTRAVENOUS

## 2014-11-10 NOTE — ED Notes (Signed)
Pt refusing to give urine sample at this time, states "there is nothing wrong with my urine, I feel fine. I want to get up and get out of this d bed. "

## 2014-11-10 NOTE — H&P (Signed)
[facility] History and Physical  Joe [name] GHW:[id number] DOB: 02-22-1925 DOA: 11/10/2014  Referring physician: er PCP: Dwan Bolt, MD   Chief Complaint: fall  HPI: Joe [name] is a 81 y.o. male  With PMhx of HTN, atrial fib and BPH.  He presents to ER today after a fall.  Patient states he was trying to put on his clothes when he fell.  ER reports that he was opening the door for his yard-man when he fell onto his left side and his tile floor.  Per report he had + LOC and + loss of bladder.  Patient was recently in the hospital in April for new onset a fib and falls.  PT recommended he be d/c;d with home health and 24 hour supervision but no family is around to help.    No vision changes or eye pain  Initially in the ER, he was only oriented to person and place but during my examination, he was oriented to all 3.  CT head and neg were unremarkable.  His orthostatic vital signs were positive per nursing from 110 to 85 upon standing SBP.    Review of Systems:  All systems reviewed, negative unless stated above    Past Medical History  Diagnosis Date  . Thyroid disease   . Anxiety   . Hypertension     [name] 08/28/2014  . Hypothyroidism     [name] 08/28/2014  . BPH (benign prostatic hypertrophy)     [name] 08/28/2014  . Atrial fibrillation with RVR 08/28/2014    [name] 08/28/2014   Past Surgical History  Procedure Laterality Date  . Pars plana vitrectomy Left 03/30/2013    Procedure: PARS PLANA VITRECTOMY 25 GAUGE FOR ENDOPHTHALMITIS;  Surgeon: Hurman Horn, MD;  Location: [facility];  Service: Ophthalmology;  Laterality: Left;  LOCAL RETROBULBAR,,  USE MARCAINE 0.5%   Social History:  reports that he has never smoked. He has never used smokeless tobacco. He reports that he does not drink alcohol or use illicit drugs.  Allergies  Allergen Reactions  . No Known Allergies     Family History  Problem Relation Age of Onset  . Hyperlipidemia Sister      Prior to Admission medications   Medication Sig Start Date End Date Taking? Authorizing Provider  Alogliptin-Pioglitazone (OSENI) 25-30 MG TABS Take 1 tablet by mouth daily.   Yes Historical Provider, MD  apixaban (ELIQUIS) 5 MG TABS tablet Take 1 tablet (5 mg total) by mouth 2 (two) times daily. 08/29/14  Yes Thurnell Lose, MD  canagliflozin (INVOKANA) 300 MG TABS tablet Take 300 mg by mouth daily before breakfast.   Yes Historical Provider, MD  cetirizine (ZYRTEC) 10 MG tablet Take 10 mg by mouth daily as needed for allergies.   Yes Historical Provider, MD  docusate sodium (COLACE) 100 MG capsule Take 100 mg by mouth daily as needed for mild constipation.   Yes Historical Provider, MD  guaiFENesin (MUCINEX) 600 MG 12 hr tablet Take 600 mg by mouth 2 (two) times daily as needed for cough.   Yes Historical Provider, MD  ibuprofen (ADVIL,MOTRIN) 200 MG tablet Take 200 mg by mouth every 6 (six) hours as needed for moderate pain.   Yes Historical Provider, MD  metoprolol tartrate (LOPRESSOR) 25 MG tablet Take 0.5 tablets (12.5 mg total) by mouth 2 (two) times daily. Patient taking differently: Take 25 mg by mouth daily.  08/29/14  Yes Thurnell Lose, MD  ramipril (ALTACE) 1.25 MG capsule  Take 1 capsule (1.25 mg total) by mouth daily. 08/29/14  Yes Thurnell Lose, MD  SYNTHROID 100 MCG tablet Take 100 mcg by mouth daily. 07/28/14  Yes Historical Provider, MD  tamsulosin (FLOMAX) 0.4 MG CAPS capsule Take 0.4 mg by mouth daily.   Yes Historical Provider, MD  zolpidem (AMBIEN) 10 MG tablet Take 10 mg by mouth at bedtime.   Yes Historical Provider, MD  mupirocin cream (BACTROBAN) 2 % Apply 1 application topically 2 (two) times daily. Patient not taking: Reported on 11/10/2014 09/13/13   Carlisle Cater, PA-C   Physical Exam: Filed Vitals:   11/10/14 1245 11/10/14 1315 11/10/14 1330 11/10/14 1332  BP: 95/66 98/71 106/61 103/64  Pulse: 78 102 84 81  Temp:      TempSrc:      Resp: 17  20    Weight:      SpO2: 97% 96% 96% 97%    Wt Readings from Last 3 Encounters:  11/10/14 78.019 kg (172 lb)  09/08/14 78.336 kg (172 lb 11.2 oz)  08/29/14 77.021 kg (169 lb 12.8 oz)    General:  Appears calm and comfortable, left eye with bandage Eyes: outer orbital swelling of left eye ENT: grossly normal hearing, lips & tongue Neck: no LAD, masses or thyromegaly Cardiovascular: irregular. No LE edema. Respiratory: CTA bilaterally, no w/r/r. Normal respiratory effort. Abdomen: soft, ntnd Skin: + bruising Musculoskeletal: grossly normal tone BUE/BLE Psychiatric: grossly normal mood and affect, speech fluent and appropriate Neurologic: grossly non-focal.          Labs on Admission:  Basic Metabolic Panel:  Recent Labs Lab 11/10/14 1054  NA 135  K 3.9  CL 102  CO2 25  GLUCOSE 182*  BUN 32*  CREATININE 1.74*  CALCIUM 9.0   Liver Function Tests:  Recent Labs Lab 11/10/14 1054  AST 17  ALT 12*  ALKPHOS 60  BILITOT 0.5  PROT 6.3*  ALBUMIN 3.3*   No results for input(s): LIPASE, AMYLASE in the last 168 hours. No results for input(s): AMMONIA in the last 168 hours. CBC:  Recent Labs Lab 11/10/14 1054  WBC 9.6  NEUTROABS 6.8  HGB 13.1  HCT 38.5*  MCV 91.7  PLT 191   Cardiac Enzymes: No results for input(s): CKTOTAL, CKMB, CKMBINDEX, TROPONINI in the last 168 hours.  BNP (last 3 results)  Recent Labs  08/28/14 1845  BNP 85.9    ProBNP (last 3 results) No results for input(s): PROBNP in the last 8760 hours.  CBG: No results for input(s): GLUCAP in the last 168 hours.  Radiological Exams on Admission: Dg Chest 1 View  11/10/2014   CLINICAL DATA:  Status post fall.  No chest complaints.  EXAM: CHEST  1 VIEW  COMPARISON:  08/28/2014  FINDINGS: There are bilateral chronic bronchitic changes. There is no focal parenchymal opacity. There is no pleural effusion or pneumothorax. The heart and mediastinal contours are unremarkable.  There is old right  posterior rib deformity.  IMPRESSION: No active disease.   Electronically Signed   By: Kathreen Devoid   On: 11/10/2014 13:15   Ct Head Wo Contrast  11/10/2014   CLINICAL DATA:  [place] on tile floor.  Loss of consciousness.  EXAM: CT HEAD WITHOUT CONTRAST  CT MAXILLOFACIAL WITHOUT CONTRAST  CT CERVICAL SPINE WITHOUT CONTRAST  TECHNIQUE: Multidetector CT imaging of the head, cervical spine, and maxillofacial structures were performed using the standard protocol without intravenous contrast. Multiplanar CT image reconstructions of the cervical spine and maxillofacial  structures were also generated.  COMPARISON:  08/28/2014  FINDINGS: CT HEAD FINDINGS  There is stable cerebral atrophy. Stable low-density throughout the white matter. No evidence for acute hemorrhage, mass lesion, midline shift, hydrocephalus or large infarct. There is left periorbital soft tissue swelling. Mucosal thickening and small polyps in left maxillary sinus. Mild mucosal thickening in the ethmoid air cells. Soft tissue lucency along the lateral left orbit is consistent with a skin laceration. There is a subtle fracture of the lateral left orbital wall.  CT MAXILLOFACIAL FINDINGS  There is left periorbital soft tissue swelling. Mild proptosis the left orbit. There is a small focus of gas along the lateral left orbit compatible with a laceration. There is a small calcification in the left parotid tissue on sequence 2, image 11. There is a mildly comminuted and displaced fracture of the lateral left orbital wall. The zygomatic arches are intact. Pterygoid plates are intact. Mandibular condyles are located. There is mucosal thickening and small polyps in the left maxillary sinus. Mild mucosal thickening ethmoid air cells. Nasal septum is intact. Nasal bones are intact.  CT CERVICAL SPINE FINDINGS  Lung apices are clear. Multilevel degenerative facet disease. No evidence for an acute fracture or dislocation in the cervical spine. Disc osteophyte  complex and bilateral foraminal narrowing at C3-C4. Disc osteophyte complex and bilateral foraminal narrowing at C4-C5. Right foraminal narrowing at C5-C6. There is no significant soft tissue swelling or lymphadenopathy in the neck. There is ankylosis of the right facets at C2-C3.  IMPRESSION: No acute intracranial abnormality. Atrophy and evidence for chronic small vessel ischemic disease.  Fracture of the left lateral orbital wall with left periorbital soft tissue swelling and a small focus of subcutaneous gas.  Multilevel degenerative changes in the cervical spine without acute bone abnormality.   Electronically Signed   By: Markus Daft M.D.   On: 11/10/2014 11:55   Ct Cervical Spine Wo Contrast  11/10/2014   CLINICAL DATA:  [place] on tile floor.  Loss of consciousness.  EXAM: CT HEAD WITHOUT CONTRAST  CT MAXILLOFACIAL WITHOUT CONTRAST  CT CERVICAL SPINE WITHOUT CONTRAST  TECHNIQUE: Multidetector CT imaging of the head, cervical spine, and maxillofacial structures were performed using the standard protocol without intravenous contrast. Multiplanar CT image reconstructions of the cervical spine and maxillofacial structures were also generated.  COMPARISON:  08/28/2014  FINDINGS: CT HEAD FINDINGS  There is stable cerebral atrophy. Stable low-density throughout the white matter. No evidence for acute hemorrhage, mass lesion, midline shift, hydrocephalus or large infarct. There is left periorbital soft tissue swelling. Mucosal thickening and small polyps in left maxillary sinus. Mild mucosal thickening in the ethmoid air cells. Soft tissue lucency along the lateral left orbit is consistent with a skin laceration. There is a subtle fracture of the lateral left orbital wall.  CT MAXILLOFACIAL FINDINGS  There is left periorbital soft tissue swelling. Mild proptosis the left orbit. There is a small focus of gas along the lateral left orbit compatible with a laceration. There is a small calcification in the left parotid  tissue on sequence 2, image 11. There is a mildly comminuted and displaced fracture of the lateral left orbital wall. The zygomatic arches are intact. Pterygoid plates are intact. Mandibular condyles are located. There is mucosal thickening and small polyps in the left maxillary sinus. Mild mucosal thickening ethmoid air cells. Nasal septum is intact. Nasal bones are intact.  CT CERVICAL SPINE FINDINGS  Lung apices are clear. Multilevel degenerative facet disease. No evidence for  an acute fracture or dislocation in the cervical spine. Disc osteophyte complex and bilateral foraminal narrowing at C3-C4. Disc osteophyte complex and bilateral foraminal narrowing at C4-C5. Right foraminal narrowing at C5-C6. There is no significant soft tissue swelling or lymphadenopathy in the neck. There is ankylosis of the right facets at C2-C3.  IMPRESSION: No acute intracranial abnormality. Atrophy and evidence for chronic small vessel ischemic disease.  Fracture of the left lateral orbital wall with left periorbital soft tissue swelling and a small focus of subcutaneous gas.  Multilevel degenerative changes in the cervical spine without acute bone abnormality.   Electronically Signed   By: Markus Daft M.D.   On: 11/10/2014 11:55   Ct Maxillofacial Wo Cm  11/10/2014   CLINICAL DATA:  [place] on tile floor.  Loss of consciousness.  EXAM: CT HEAD WITHOUT CONTRAST  CT MAXILLOFACIAL WITHOUT CONTRAST  CT CERVICAL SPINE WITHOUT CONTRAST  TECHNIQUE: Multidetector CT imaging of the head, cervical spine, and maxillofacial structures were performed using the standard protocol without intravenous contrast. Multiplanar CT image reconstructions of the cervical spine and maxillofacial structures were also generated.  COMPARISON:  08/28/2014  FINDINGS: CT HEAD FINDINGS  There is stable cerebral atrophy. Stable low-density throughout the white matter. No evidence for acute hemorrhage, mass lesion, midline shift, hydrocephalus or large infarct. There  is left periorbital soft tissue swelling. Mucosal thickening and small polyps in left maxillary sinus. Mild mucosal thickening in the ethmoid air cells. Soft tissue lucency along the lateral left orbit is consistent with a skin laceration. There is a subtle fracture of the lateral left orbital wall.  CT MAXILLOFACIAL FINDINGS  There is left periorbital soft tissue swelling. Mild proptosis the left orbit. There is a small focus of gas along the lateral left orbit compatible with a laceration. There is a small calcification in the left parotid tissue on sequence 2, image 11. There is a mildly comminuted and displaced fracture of the lateral left orbital wall. The zygomatic arches are intact. Pterygoid plates are intact. Mandibular condyles are located. There is mucosal thickening and small polyps in the left maxillary sinus. Mild mucosal thickening ethmoid air cells. Nasal septum is intact. Nasal bones are intact.  CT CERVICAL SPINE FINDINGS  Lung apices are clear. Multilevel degenerative facet disease. No evidence for an acute fracture or dislocation in the cervical spine. Disc osteophyte complex and bilateral foraminal narrowing at C3-C4. Disc osteophyte complex and bilateral foraminal narrowing at C4-C5. Right foraminal narrowing at C5-C6. There is no significant soft tissue swelling or lymphadenopathy in the neck. There is ankylosis of the right facets at C2-C3.  IMPRESSION: No acute intracranial abnormality. Atrophy and evidence for chronic small vessel ischemic disease.  Fracture of the left lateral orbital wall with left periorbital soft tissue swelling and a small focus of subcutaneous gas.  Multilevel degenerative changes in the cervical spine without acute bone abnormality.   Electronically Signed   By: Markus Daft M.D.   On: 11/10/2014 11:55    EKG: Independently reviewed. Atrial fib  Assessment/Plan Active Problems:   Hypothyroid   Diabetes mellitus with no complication   A-fib   Syncope and  collapse   Orthostatic hypotension   Syncope and collapse- due to orthostatic hypotension -gentle IVF -recheck orthos in AM -hold some BP meds -patient does not eat well at home due to functional issues- may need social work consult and meals on wheels PT Eval Neuro checks  Atrial fib -rate controlled -reduced eliquis per pharmacy recs  DM -  SSI  hypothyroid - TSH ok in 2016    Code Status: DNR DVT Prophylaxis: Family Communication: patient Disposition Plan:   Time spent: 92 min  Eulogio Bear [facility] Pager [phone]

## 2014-11-10 NOTE — ED Notes (Addendum)
Pt upset and requesting to leave, pt states that he is feeling okay and wants to get out of the bed. THis RN explaining to pt need to wait for results and informed pt that this RN will let MD know.

## 2014-11-10 NOTE — ED Notes (Signed)
Dr. Vann at bedside 

## 2014-11-10 NOTE — ED Notes (Signed)
MD states pt has full ocular movement of left eye at this time.

## 2014-11-10 NOTE — Progress Notes (Signed)
Patient very confused, unable to keep him oriented for longer than 5 minutes. Patient continues to try to get up, thinks he is at home. Paged NP Schorr to get order for safety sitter. [facility] [employer]

## 2014-11-10 NOTE — ED Notes (Signed)
Pt continues to have repetive questioning about falling.

## 2014-11-10 NOTE — Progress Notes (Signed)
RT responded to level 2 trauma in ED D35. Pt on RA with spo2 97%. Pt denies SOB. Pt stable at this time. RT will continue to monitor.

## 2014-11-10 NOTE — ED Provider Notes (Signed)
CSN: [id number]     Arrival date & time 11/10/14  1040 History   First MD Initiated Contact with Patient 11/10/14 1041     Chief Complaint  Patient presents with  . Fall   (Consider location/radiation/quality/duration/timing/severity/associated sxs/prior Treatment)  Patient is a 81 y.o. male presenting with fall. The history is provided by the EMS personnel. No language interpreter was used.  Fall This is a new problem. The current episode started today. Pertinent negatives include no abdominal pain, chest pain, congestion, coughing, diaphoresis, fatigue, headaches, neck pain, numbness, rash, vertigo, visual change, vomiting or weakness. Nothing aggravates the symptoms. He has tried nothing for the symptoms.    Past Medical History  Diagnosis Date  . Thyroid disease   . Anxiety   . Hypertension     [name] 08/28/2014  . Hypothyroidism     [name] 08/28/2014  . BPH (benign prostatic hypertrophy)     [name] 08/28/2014  . Atrial fibrillation with RVR 08/28/2014    [name] 08/28/2014   Past Surgical History  Procedure Laterality Date  . Pars plana vitrectomy Left 03/30/2013    Procedure: PARS PLANA VITRECTOMY 25 GAUGE FOR ENDOPHTHALMITIS;  Surgeon: Hurman Horn, MD;  Location: [facility];  Service: Ophthalmology;  Laterality: Left;  LOCAL RETROBULBAR,,  USE MARCAINE 0.5%   Family History  Problem Relation Age of Onset  . Hyperlipidemia Sister    History  Substance Use Topics  . Smoking status: Never Smoker   . Smokeless tobacco: Never Used  . Alcohol Use: No    Review of Systems  Constitutional: Negative for diaphoresis and fatigue.  HENT: Negative for congestion.   Respiratory: Negative for cough, chest tightness and shortness of breath.   Cardiovascular: Negative for chest pain.  Gastrointestinal: Negative for vomiting and abdominal pain.  Musculoskeletal: Negative for neck pain.  Skin: Negative for rash.  Neurological: Positive for syncope. Negative for vertigo, weakness,  light-headedness, numbness and headaches.  Psychiatric/Behavioral: Positive for confusion.  All other systems reviewed and are negative.     Allergies  No known allergies  Home Medications   Prior to Admission medications   Medication Sig Start Date End Date Taking? Authorizing Provider  Alogliptin-Pioglitazone (OSENI) 25-30 MG TABS Take 1 tablet by mouth daily.    Historical Provider, MD  apixaban (ELIQUIS) 5 MG TABS tablet Take 1 tablet (5 mg total) by mouth 2 (two) times daily. 08/29/14   Thurnell Lose, MD  cetirizine (ZYRTEC) 10 MG tablet Take 10 mg by mouth daily as needed for allergies.    Historical Provider, MD  diazepam (VALIUM) 10 MG tablet Take 10 mg by mouth 2 (two) times daily. 08/15/14   Historical Provider, MD  metoprolol tartrate (LOPRESSOR) 25 MG tablet Take 0.5 tablets (12.5 mg total) by mouth 2 (two) times daily. 08/29/14   Thurnell Lose, MD  mupirocin cream (BACTROBAN) 2 % Apply 1 application topically 2 (two) times daily. 09/13/13   Carlisle Cater, PA-C  OVER THE COUNTER MEDICATION Take 1 capsule by mouth at bedtime as needed (Constipation). Laxative    Historical Provider, MD  ramipril (ALTACE) 1.25 MG capsule Take 1 capsule (1.25 mg total) by mouth daily. 08/29/14   Thurnell Lose, MD  SYNTHROID 100 MCG tablet Take 100 mcg by mouth daily. 07/28/14   Historical Provider, MD  tamsulosin (FLOMAX) 0.4 MG CAPS capsule Take 0.4 mg by mouth daily.    Historical Provider, MD   BP 104/57 mmHg  Temp(Src) 97.3 F (36.3 C) (Oral)  Resp 18  Wt 172 lb (78.019 kg)  SpO2 96% Physical Exam  Constitutional: He appears well-developed and well-nourished. He is cooperative. No distress. Cervical collar and backboard in place.  HENT:  Head: Normocephalic.  Nose: Nose normal.  Mouth/Throat: Oropharynx is clear and moist. No oropharyngeal exudate.  Soft swelling to left upper eyelid and cheek.  Still able to open eye easily and EOMI without signs of entrapment.  Tender to  palpation around left eye.  1.5 cm linear laceration to lateral aspect of left eye/temple that is hemostatic.  Superficial abrasion and hematoma at left temporal scalp, with dried blood in hair.  Very hard of hearing  Eyes: EOM are normal. Pupils are equal, round, and reactive to light.  Neck:  EMS c-collar in place  Cardiovascular: Normal rate, regular rhythm, normal heart sounds and intact distal pulses.   No murmur heard. Pulmonary/Chest: Effort normal and breath sounds normal. No respiratory distress. He has no wheezes. He exhibits no tenderness.  Abdominal: Soft. He exhibits no distension. There is no tenderness. There is no guarding.  Musculoskeletal: Normal range of motion. He exhibits no tenderness.  Neurological: He is alert. No cranial nerve deficit. Coordination normal.  Oriented to person and place but not to time.  Able to provide some limited history but unable to provide details on PMH, etc.  Moving all extremities spontaneously and without gross deficit  Skin: Skin is warm and dry. He is not diaphoretic. No pallor.  Psychiatric: He has a normal mood and affect. His behavior is normal. Judgment and thought content normal.  Nursing note and vitals reviewed.   ED Course  LACERATION REPAIR Date/Time: 11/10/2014 2:45 PM Performed by: Tori Milks Authorized by: Debby Freiberg Consent: Verbal consent obtained. Risks and benefits: risks, benefits and alternatives were discussed Consent given by: patient Required items: required blood products, implants, devices, and special equipment available Patient identity confirmed: verbally with patient Time out: Immediately prior to procedure a "time out" was called to verify the correct patient, procedure, equipment, support staff and site/side marked as required. Body area: head/neck Location details: left cheek Laceration length: 1.5 cm Foreign bodies: no foreign bodies Tendon involvement: none Nerve involvement: none Vascular  damage: no Anesthesia: local infiltration Local anesthetic: lidocaine 2% with epinephrine Anesthetic total: 3 ml Patient sedated: no Preparation: Patient was prepped and draped in the usual sterile fashion. Irrigation solution: saline Irrigation method: syringe Amount of cleaning: standard Debridement: none Degree of undermining: none Wound skin closure material used: 5-0 chromic gut. Number of sutures: 3 Technique: simple Approximation: close Approximation difficulty: simple Dressing: antibiotic ointment Patient tolerance: Patient tolerated the procedure well with no immediate complications   (including critical care time) Labs Review Labs Reviewed  CBC WITH DIFFERENTIAL/PLATELET - Abnormal; Notable for the following:    RBC 4.20 (*)    HCT 38.5 (*)    All other components within normal limits  COMPREHENSIVE METABOLIC PANEL - Abnormal; Notable for the following:    Glucose, Bld 182 (*)    BUN 32 (*)    Creatinine, Ser 1.74 (*)    Total Protein 6.3 (*)    Albumin 3.3 (*)    ALT 12 (*)    GFR calc non Af Amer 34 (*)    GFR calc Af Amer 39 (*)    All other components within normal limits  PROTIME-INR - Abnormal; Notable for the following:    Prothrombin Time 16.8 (*)    All other components within normal limits  ETHANOL  URINE RAPID DRUG SCREEN, HOSP PERFORMED  I-STAT [place], ED  CBG MONITORING, ED  Randolm Idol, ED    Imaging Review Dg Chest 1 View  11/10/2014   CLINICAL DATA:  Status post fall.  No chest complaints.  EXAM: CHEST  1 VIEW  COMPARISON:  08/28/2014  FINDINGS: There are bilateral chronic bronchitic changes. There is no focal parenchymal opacity. There is no pleural effusion or pneumothorax. The heart and mediastinal contours are unremarkable.  There is old right posterior rib deformity.  IMPRESSION: No active disease.   Electronically Signed   By: Kathreen Devoid   On: 11/10/2014 13:15   Ct Head Wo Contrast  11/10/2014   CLINICAL DATA:  [place] on tile  floor.  Loss of consciousness.  EXAM: CT HEAD WITHOUT CONTRAST  CT MAXILLOFACIAL WITHOUT CONTRAST  CT CERVICAL SPINE WITHOUT CONTRAST  TECHNIQUE: Multidetector CT imaging of the head, cervical spine, and maxillofacial structures were performed using the standard protocol without intravenous contrast. Multiplanar CT image reconstructions of the cervical spine and maxillofacial structures were also generated.  COMPARISON:  08/28/2014  FINDINGS: CT HEAD FINDINGS  There is stable cerebral atrophy. Stable low-density throughout the white matter. No evidence for acute hemorrhage, mass lesion, midline shift, hydrocephalus or large infarct. There is left periorbital soft tissue swelling. Mucosal thickening and small polyps in left maxillary sinus. Mild mucosal thickening in the ethmoid air cells. Soft tissue lucency along the lateral left orbit is consistent with a skin laceration. There is a subtle fracture of the lateral left orbital wall.  CT MAXILLOFACIAL FINDINGS  There is left periorbital soft tissue swelling. Mild proptosis the left orbit. There is a small focus of gas along the lateral left orbit compatible with a laceration. There is a small calcification in the left parotid tissue on sequence 2, image 11. There is a mildly comminuted and displaced fracture of the lateral left orbital wall. The zygomatic arches are intact. Pterygoid plates are intact. Mandibular condyles are located. There is mucosal thickening and small polyps in the left maxillary sinus. Mild mucosal thickening ethmoid air cells. Nasal septum is intact. Nasal bones are intact.  CT CERVICAL SPINE FINDINGS  Lung apices are clear. Multilevel degenerative facet disease. No evidence for an acute fracture or dislocation in the cervical spine. Disc osteophyte complex and bilateral foraminal narrowing at C3-C4. Disc osteophyte complex and bilateral foraminal narrowing at C4-C5. Right foraminal narrowing at C5-C6. There is no significant soft tissue  swelling or lymphadenopathy in the neck. There is ankylosis of the right facets at C2-C3.  IMPRESSION: No acute intracranial abnormality. Atrophy and evidence for chronic small vessel ischemic disease.  Fracture of the left lateral orbital wall with left periorbital soft tissue swelling and a small focus of subcutaneous gas.  Multilevel degenerative changes in the cervical spine without acute bone abnormality.   Electronically Signed   By: Markus Daft M.D.   On: 11/10/2014 11:55   Ct Cervical Spine Wo Contrast  11/10/2014   CLINICAL DATA:  [place] on tile floor.  Loss of consciousness.  EXAM: CT HEAD WITHOUT CONTRAST  CT MAXILLOFACIAL WITHOUT CONTRAST  CT CERVICAL SPINE WITHOUT CONTRAST  TECHNIQUE: Multidetector CT imaging of the head, cervical spine, and maxillofacial structures were performed using the standard protocol without intravenous contrast. Multiplanar CT image reconstructions of the cervical spine and maxillofacial structures were also generated.  COMPARISON:  08/28/2014  FINDINGS: CT HEAD FINDINGS  There is stable cerebral atrophy. Stable low-density throughout the white matter. No evidence for  acute hemorrhage, mass lesion, midline shift, hydrocephalus or large infarct. There is left periorbital soft tissue swelling. Mucosal thickening and small polyps in left maxillary sinus. Mild mucosal thickening in the ethmoid air cells. Soft tissue lucency along the lateral left orbit is consistent with a skin laceration. There is a subtle fracture of the lateral left orbital wall.  CT MAXILLOFACIAL FINDINGS  There is left periorbital soft tissue swelling. Mild proptosis the left orbit. There is a small focus of gas along the lateral left orbit compatible with a laceration. There is a small calcification in the left parotid tissue on sequence 2, image 11. There is a mildly comminuted and displaced fracture of the lateral left orbital wall. The zygomatic arches are intact. Pterygoid plates are intact. Mandibular  condyles are located. There is mucosal thickening and small polyps in the left maxillary sinus. Mild mucosal thickening ethmoid air cells. Nasal septum is intact. Nasal bones are intact.  CT CERVICAL SPINE FINDINGS  Lung apices are clear. Multilevel degenerative facet disease. No evidence for an acute fracture or dislocation in the cervical spine. Disc osteophyte complex and bilateral foraminal narrowing at C3-C4. Disc osteophyte complex and bilateral foraminal narrowing at C4-C5. Right foraminal narrowing at C5-C6. There is no significant soft tissue swelling or lymphadenopathy in the neck. There is ankylosis of the right facets at C2-C3.  IMPRESSION: No acute intracranial abnormality. Atrophy and evidence for chronic small vessel ischemic disease.  Fracture of the left lateral orbital wall with left periorbital soft tissue swelling and a small focus of subcutaneous gas.  Multilevel degenerative changes in the cervical spine without acute bone abnormality.   Electronically Signed   By: Markus Daft M.D.   On: 11/10/2014 11:55   Ct Maxillofacial Wo Cm  11/10/2014   CLINICAL DATA:  [place] on tile floor.  Loss of consciousness.  EXAM: CT HEAD WITHOUT CONTRAST  CT MAXILLOFACIAL WITHOUT CONTRAST  CT CERVICAL SPINE WITHOUT CONTRAST  TECHNIQUE: Multidetector CT imaging of the head, cervical spine, and maxillofacial structures were performed using the standard protocol without intravenous contrast. Multiplanar CT image reconstructions of the cervical spine and maxillofacial structures were also generated.  COMPARISON:  08/28/2014  FINDINGS: CT HEAD FINDINGS  There is stable cerebral atrophy. Stable low-density throughout the white matter. No evidence for acute hemorrhage, mass lesion, midline shift, hydrocephalus or large infarct. There is left periorbital soft tissue swelling. Mucosal thickening and small polyps in left maxillary sinus. Mild mucosal thickening in the ethmoid air cells. Soft tissue lucency along the lateral  left orbit is consistent with a skin laceration. There is a subtle fracture of the lateral left orbital wall.  CT MAXILLOFACIAL FINDINGS  There is left periorbital soft tissue swelling. Mild proptosis the left orbit. There is a small focus of gas along the lateral left orbit compatible with a laceration. There is a small calcification in the left parotid tissue on sequence 2, image 11. There is a mildly comminuted and displaced fracture of the lateral left orbital wall. The zygomatic arches are intact. Pterygoid plates are intact. Mandibular condyles are located. There is mucosal thickening and small polyps in the left maxillary sinus. Mild mucosal thickening ethmoid air cells. Nasal septum is intact. Nasal bones are intact.  CT CERVICAL SPINE FINDINGS  Lung apices are clear. Multilevel degenerative facet disease. No evidence for an acute fracture or dislocation in the cervical spine. Disc osteophyte complex and bilateral foraminal narrowing at C3-C4. Disc osteophyte complex and bilateral foraminal narrowing at C4-C5. Right foraminal  narrowing at C5-C6. There is no significant soft tissue swelling or lymphadenopathy in the neck. There is ankylosis of the right facets at C2-C3.  IMPRESSION: No acute intracranial abnormality. Atrophy and evidence for chronic small vessel ischemic disease.  Fracture of the left lateral orbital wall with left periorbital soft tissue swelling and a small focus of subcutaneous gas.  Multilevel degenerative changes in the cervical spine without acute bone abnormality.   Electronically Signed   By: Markus Daft M.D.   On: 11/10/2014 11:55     EKG Interpretation   Date/Time:  Monday November 10 2014 10:50:41 EDT Ventricular Rate:  89 PR Interval:    QRS Duration: 128 QT Interval:  382 QTC Calculation: 465 R Axis:   81 Text Interpretation:  Atrial fibrillation Right bundle branch block  Borderline ST elevation, lateral leads No significant change since last  tracing Confirmed by  Debby Freiberg [phone]) on 11/10/2014 10:55:32 AM      MDM   Final diagnoses:  Fall, initial encounter  Syncope and collapse   Pt is a 81 yo M with hx of HTN, Afib (on eliquis), hypothyroidism, and BPH who presented after a fall.  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  Impacted his left face on the tile floor.  Had + LOC for several minutes, loss of bladder, and was amnestic to the event.  Lives alone and has people check on him frequently, but reportedly can manage his ADLs by himself at baseline.  Has had GCS 15 but is only oriented to person and place.  Believes it is 1920s.  Recently diagnosed with Afib and has been on Eliquis for a few months.  Denied any pain on arrival.  Afib with rate in 80s on arrival. Left upper eyelid swelling but is soft, EOMI with no evidence of intrapment.  No gross facial bony instability but tender around left eye.  Abrasion to left temporal scalp ad lac at left lateral eye that is hemostatic.  Moving all extremities without gross deformities.  Nontender throughout.    Will obtain CT head, c-spine, and face, and CXR.  Labs ordered including troponin, UDS, and EtOH levels.    Imaging returned showing an acute left orbital wall fracture.  No head bleed.  C-spine clear.   Patient removed his c-collar prior to official clearance, but imaging is negative, he is nontender to palpation, and has full ROM, so ok to leave off.    Laceration to left cheek was sutured closed with 3 x 5-0 absorbable sutures.  Pt tolerated well.  He and his sister were given instructions on wound care.  Advised f/u in 1 week for wound check.   Will admit for continued syncope work up in this high risk patient.   Documented PCP is his endocrinologist, Dr. Wilson Singer.  [facility]    Will go to hospitalists team for syncope and collapse.  Orthostatic vitals added per admitting team request.     If performed, labs, EKGs, and imaging were reviewed and interpreted by myself and my attending, and incorporated in the medical decision making.  Patient was seen with ED Attending, Dr. Sylvester Harder, MD   Tori Milks, MD 11/10/14 1601  Debby Freiberg, MD 11/14/14 [phone]

## 2014-11-10 NOTE — ED Notes (Signed)
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 Fell onto tile floor on left side; LOC; CBG 126; afib- blood thinners; denying pain. Skin tears left elbow and wrist. Performs ADLS himself but has assistance from others at home. Incontinent of urine upon fall. Left eye swollen shut. Did not seen pt tripped with fall.

## 2014-11-10 NOTE — Progress Notes (Signed)
Telephone orders received from Dr. Eliseo Squires to hold 1600 dose of p.o. Metoprolol and rampiril.  Will continue to closely monitor.

## 2014-11-10 NOTE — ED Notes (Signed)
Patient transported to CT without distress 

## 2014-11-11 ENCOUNTER — Encounter (HOSPITAL_COMMUNITY): Payer: Self-pay | Admitting: General Practice

## 2014-11-11 DIAGNOSIS — R55 Syncope and collapse: Secondary | ICD-10-CM | POA: Diagnosis not present

## 2014-11-11 LAB — BASIC METABOLIC PANEL
ANION GAP: 6 (ref 5–15)
BUN: 19 mg/dL (ref 6–20)
CO2: 25 mmol/L (ref 22–32)
CREATININE: 1.18 mg/dL (ref 0.61–1.24)
Calcium: 8.2 mg/dL — ABNORMAL LOW (ref 8.9–10.3)
Chloride: 108 mmol/L (ref 101–111)
GFR calc non Af Amer: 54 mL/min — ABNORMAL LOW (ref >60)
Glucose, Bld: 133 mg/dL — ABNORMAL HIGH (ref 65–99)
POTASSIUM: 3.9 mmol/L (ref 3.5–5.1)
Sodium: 139 mmol/L (ref 135–145)

## 2014-11-11 LAB — CBC
HEMATOCRIT: 33.9 % — AB (ref 39.0–52.0)
Hemoglobin: 11.4 g/dL — ABNORMAL LOW (ref 13.0–17.0)
MCH: 31 pg (ref 26.0–34.0)
MCHC: 33.6 g/dL (ref 30.0–36.0)
MCV: 92.1 fL (ref 78.0–100.0)
PLATELETS: 172 10*3/uL (ref 150–400)
RBC: 3.68 MIL/uL — ABNORMAL LOW (ref 4.22–5.81)
RDW: 15.6 % — ABNORMAL HIGH (ref 11.5–15.5)
WBC: 6.7 10*3/uL (ref 4.0–10.5)

## 2014-11-11 LAB — GLUCOSE, CAPILLARY
Glucose-Capillary: 157 mg/dL — ABNORMAL HIGH (ref 65–99)
Glucose-Capillary: 140 mg/dL — ABNORMAL HIGH (ref 65–99)
Glucose-Capillary: 123 mg/dL — ABNORMAL HIGH (ref 65–99)

## 2014-11-11 MED ORDER — METOPROLOL TARTRATE 25 MG PO TABS: 6.2500 mg | ORAL_TABLET | Freq: Two times a day (BID) | ORAL | Status: DC

## 2014-11-11 MED ORDER — APIXABAN 5 MG PO TABS
5.0000 mg | ORAL_TABLET | Freq: Two times a day (BID) | ORAL | Status: DC
  Administered 2014-11-11 – 2014-11-12 (×2): 5 mg via ORAL
  Filled 2014-11-11 (×2): qty 1

## 2014-11-11 MED ORDER — METOPROLOL TARTRATE 25 MG/10 ML ORAL SUSPENSION
6.2500 mg | Freq: Two times a day (BID) | ORAL | Status: DC
  Administered 2014-11-11 – 2014-11-12 (×3): 6.25 mg via ORAL
  Filled 2014-11-11 (×5): qty 2.5

## 2014-11-11 NOTE — Discharge Instructions (Signed)
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
## 2014-11-11 NOTE — Evaluation (Addendum)
Occupational Therapy Evaluation Patient Details Name: Joe [name] MRN: [id number] DOB: Jul 08, 1924 Today's Date: 11/11/2014    History of Present Illness 81 y.o. with PMH of HTN, atrial fib and BPH. He presented to ER today after a fall. Patient states he was trying to put on his clothes when he fell. ER reports that he was opening the door for his yard-man when he fell onto his left side and his tile floor. Per report he had + LOC and + loss of bladder. Patient was recently in the hospital in April for new onset a fib and falls.   Clinical Impression   Pt admitted with above. Pt lives alone. Feel pt will benefit from acute OT to increase independence, strength, and safety prior to d/c. Recommending SNF for rehab, however pt refusing, so recommend HHOT if pt continues to refuse SNF (unsure he will agree to [facility]).     Follow Up Recommendations  SNF;Supervision/Assistance - 24 hour    Equipment Recommendations  Other (comment) (TBD)    Recommendations for Other Services       Precautions / Restrictions Precautions Precautions: Fall Restrictions Weight Bearing Restrictions: No      Mobility Bed Mobility               General bed mobility comments: not assessed  Transfers Overall transfer level: Needs assistance   Transfers: Sit to/from Stand Sit to Stand: Min guard;Min assist         General transfer comment: assist for balance with first sit to stand from chair    Balance  Min assist for standing balance at times; history of falls.                                          ADL Overall ADL's : Needs assistance/impaired     Grooming: Minimal assistance;Wash/dry face;Standing Grooming Details (indicate cue type and reason): assist for balance             Lower Body Dressing: Minimal assistance;Sit to/from stand;Min guard   Toilet Transfer: Min guard;Minimal assistance;Ambulation;RW (chair)           Functional mobility  during ADLs: Min guard;Minimal assistance;Rolling walker (also ambulated without walker) General ADL Comments: Educated on safety such as sitting for LB dressing. Discussed recommendation for rehab but pt not agreeable.     Vision     Perception     Praxis      Pertinent Vitals/Pain Pain Assessment: No/denies pain     Hand Dominance Right   Extremity/Trunk Assessment Upper Extremity Assessment Upper Extremity Assessment: Generalized weakness (Rt shoulder weaker than left)   Lower Extremity Assessment Lower Extremity Assessment: Defer to PT evaluation       Communication Communication Communication: HOH   Cognition Arousal/Alertness: Awake/alert Behavior During Therapy: WFL for tasks assessed/performed Overall Cognitive Status: No family/caregiver present to determine baseline cognitive functioning Area of Impairment: Orientation;Safety/judgement;Memory;Problem solving Orientation Level: Disoriented to;Time   Memory: Decreased short-term memory   Safety/Judgement: Decreased awareness of safety;Decreased awareness of deficits   Problem Solving: Difficulty sequencing General Comments: pt attempting to put new sock on over dirty one.    General Comments       Exercises       Shoulder Instructions      Home Living Family/patient expects to be discharged to:: Private residence Living Arrangements: Alone Available Help at Discharge: Neighbor;Available PRN/intermittently Type  of Home: House Home Access: Stairs to enter [employer] of Steps: 7 and 4 Entrance Stairs-Rails: Right;Left;Can reach both Home Layout: One level     Bathroom Shower/Tub: Occupational psychologist: Handicapped height     Home Equipment: Bedside commode   Additional Comments: unsure of accuracy of information given by pt.      Prior Functioning/Environment Level of Independence: Independent             OT Diagnosis: Generalized weakness   OT Problem List:  Decreased strength;Decreased knowledge of precautions;Decreased safety awareness;Decreased cognition;Decreased knowledge of use of DME or AE;Impaired balance (sitting and/or standing)   OT Treatment/Interventions: Self-care/ADL training;Therapeutic exercise;DME and/or AE instruction;Therapeutic activities;Cognitive remediation/compensation;Patient/family education;Balance training    OT Goals(Current goals can be found in the care plan section) Acute Rehab OT Goals Patient Stated Goal: go home OT Goal Formulation: With patient Time For Goal Achievement: 11/18/14 Potential to Achieve Goals: Good ADL Goals Pt Will Perform Lower Body Dressing: with set-up;sit to/from stand Pt Will Transfer to Toilet: with supervision;ambulating (elevated toilet) Pt Will Perform Toileting - Clothing Manipulation and hygiene: sit to/from stand;with supervision Additional ADL Goal #1: Pt will independently perform bilateral UE exercise to increase strength.  OT Frequency: Min 2X/week   Barriers to D/C:            Co-evaluation              End of Session Equipment Utilized During Treatment: Gait belt;Rolling walker Nurse Communication: Mobility status;Other (comment) (d/c recommendation)  Activity Tolerance: Patient tolerated treatment well Patient left: in chair;with call bell/phone within reach;with nursing/sitter in room   Time: 0907-0921 OT Time Calculation (min): 14 min Charges:  OT General Charges $OT Visit: 1 Procedure OT Evaluation $Initial OT Evaluation Tier I: 1 Procedure G-Codes: OT G-codes **NOT FOR INPATIENT CLASS** Functional Assessment Tool Used: clinical judgment Functional Limitation: Self care Self Care Current Status (Y0998): At least 1 percent but less than 20 percent impaired, limited or restricted Self Care Goal Status (P3825): At least 1 percent but less than 20 percent impaired, limited or restricted  Benito Mccreedy OTR/L [phone] 11/11/2014, 10:18 AM
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
## 2014-11-12 ENCOUNTER — Observation Stay (HOSPITAL_BASED_OUTPATIENT_CLINIC_OR_DEPARTMENT_OTHER): Payer: Medicare Other

## 2014-11-12 DIAGNOSIS — R55 Syncope and collapse: Secondary | ICD-10-CM

## 2014-11-12 DIAGNOSIS — E039 Hypothyroidism, unspecified: Secondary | ICD-10-CM

## 2014-11-12 DIAGNOSIS — E119 Type 2 diabetes mellitus without complications: Secondary | ICD-10-CM

## 2014-11-12 DIAGNOSIS — I4891 Unspecified atrial fibrillation: Secondary | ICD-10-CM

## 2014-11-12 DIAGNOSIS — N400 Enlarged prostate without lower urinary tract symptoms: Secondary | ICD-10-CM | POA: Diagnosis not present

## 2014-11-12 DIAGNOSIS — F4329 Adjustment disorder with other symptoms: Secondary | ICD-10-CM

## 2014-11-12 DIAGNOSIS — I951 Orthostatic hypotension: Secondary | ICD-10-CM

## 2014-11-12 LAB — COMPREHENSIVE METABOLIC PANEL
ALBUMIN: 2.5 g/dL — AB (ref 3.5–5.0)
ALT: 10 U/L — ABNORMAL LOW (ref 17–63)
ANION GAP: 5 (ref 5–15)
AST: 13 U/L — AB (ref 15–41)
Alkaline Phosphatase: 46 U/L (ref 38–126)
BUN: 13 mg/dL (ref 6–20)
CALCIUM: 8 mg/dL — AB (ref 8.9–10.3)
CO2: 24 mmol/L (ref 22–32)
Chloride: 110 mmol/L (ref 101–111)
Creatinine, Ser: 1.15 mg/dL (ref 0.61–1.24)
GFR calc Af Amer: >60 mL/min (ref >60)
GFR calc non Af Amer: 55 mL/min — ABNORMAL LOW (ref >60)
Glucose, Bld: 122 mg/dL — ABNORMAL HIGH (ref 65–99)
Potassium: 3.9 mmol/L (ref 3.5–5.1)
Sodium: 139 mmol/L (ref 135–145)
TOTAL PROTEIN: 5 g/dL — AB (ref 6.5–8.1)
Total Bilirubin: 0.5 mg/dL (ref 0.3–1.2)

## 2014-11-12 LAB — CBC
HCT: 33.1 % — ABNORMAL LOW (ref 39.0–52.0)
HEMOGLOBIN: 11.1 g/dL — AB (ref 13.0–17.0)
MCH: 31.1 pg (ref 26.0–34.0)
MCHC: 33.5 g/dL (ref 30.0–36.0)
MCV: 92.7 fL (ref 78.0–100.0)
PLATELETS: 165 10*3/uL (ref 150–400)
RBC: 3.57 MIL/uL — ABNORMAL LOW (ref 4.22–5.81)
RDW: 15.7 % — ABNORMAL HIGH (ref 11.5–15.5)
WBC: 6.4 10*3/uL (ref 4.0–10.5)

## 2014-11-12 LAB — TROPONIN I: Troponin I: <0.03 ng/mL (ref <0.031)

## 2014-11-12 LAB — GLUCOSE, CAPILLARY
GLUCOSE-CAPILLARY: 125 mg/dL — AB (ref 65–99)
GLUCOSE-CAPILLARY: 155 mg/dL — AB (ref 65–99)

## 2014-11-12 MED ORDER — METOPROLOL TARTRATE 25 MG/10 ML ORAL SUSPENSION: 6.2500 mg | Freq: Two times a day (BID) | ORAL | Status: DC

## 2014-11-12 MED ORDER — LORAZEPAM 2 MG/ML IJ SOLN
0.5000 mg | Freq: Once | INTRAMUSCULAR | Status: AC
  Administered 2014-11-12: 0.5 mg via INTRAVENOUS
  Filled 2014-11-12: qty 1

## 2014-11-12 NOTE — Consult Note (Addendum)
[facility] Consult   Reason for Consult:  Capacity evaluation Referring Physician:  Dr. Tana Coast Patient Identification: Joe [name] MRN:  [id number] Principal Diagnosis: Adjustment disorder with mixed anxiety and depressed mood and cognitive deficits. Diagnosis:   Patient Active Problem List   Diagnosis Date Noted  . Syncope and collapse [R55] 11/10/2014  . Orthostatic hypotension [I95.1] 11/10/2014  . Hypothyroidism [E03.9] 08/28/2014  . Essential hypertension [I10] 08/28/2014  . Atrial fibrillation, new onset [I48.91] 08/28/2014  . Dizziness [R42] 08/28/2014  . BPH (benign prostatic hypertrophy) [N40.0] 08/28/2014  . Diabetes mellitus with no complication [B58.3] 09/40/7680  . Risky sexual behavior [Z72.51] 08/28/2014  . A-fib [I48.91] 08/28/2014  . Hypothyroid [E03.9] 09/10/2013  . BPH (benign prostatic hyperplasia) [N40.0] 09/10/2013    Total Time spent with patient: 45 minutes  Subjective:   Joe [name] is a 81 y.o. male patient admitted with fall.  HPI:  Joe [name] is a 81 y.o. male seen for capacity evaluation as he was living alone and has recent fall. Patient stated that his wife passed away and he has sister. Patient has ADL intact until he was hospitalized. He is able to care of his property and has several animals he needs to feed regularly. Reportedly he hired a couple of people who helps him. He has few cognitive deficits, orientation time, place and person. His immediate recall 3/3 but delayed recall only 2/3, fine concentration and language functions. Patient has requested to talk to his physician because he wants to go back to his home due to being in hospital for three days, started worried about feeding the animals. Patient sister is not available at this time. Patient has understanding about his current medical problems and needed treatment and willing to follow up with out patient recommendations. Patient denied current suicide or homicide  ideation, intention or plans.   HPI Elements:   Location:  cognitive deficits. Quality:  fair to good. Severity:  mild to moderat. Timing:  unknown. Duration:  three days. Context:  accidental fall while opening door .  Past Medical History:  Past Medical History  Diagnosis Date  . Thyroid disease   . Anxiety   . Hypertension     [name] 08/28/2014  . BPH (benign prostatic hypertrophy)     [name] 08/28/2014  . Atrial fibrillation with RVR 08/28/2014    [name] 08/28/2014  . Hypothyroidism     [name] 08/28/2014    Past Surgical History  Procedure Laterality Date  . Pars plana vitrectomy Left 03/30/2013    Procedure: PARS PLANA VITRECTOMY 25 GAUGE FOR ENDOPHTHALMITIS;  Surgeon: Hurman Horn, MD;  Location: [facility];  Service: Ophthalmology;  Laterality: Left;  LOCAL RETROBULBAR,,  USE MARCAINE 0.5%   Family History:  Family History  Problem Relation Age of Onset  . Hyperlipidemia Sister    Social History:  History  Alcohol Use No     History  Drug Use No    History   Social History  . Marital Status: Widowed    Spouse Name: N/A  . Number of Children: N/A  . Years of Education: N/A   Social History Main Topics  . Smoking status: Never Smoker   . Smokeless tobacco: Never Used  . Alcohol Use: No  . Drug Use: No  . Sexual Activity: Not on file   Other Topics Concern  . None   Social History Narrative   ** Merged History Encounter **       Additional Social History:  Allergies:   Allergies  Allergen Reactions  . No Known Allergies     Labs:  Results for orders placed or performed during the hospital encounter of 11/10/14 (from the past 48 hour(s))  Glucose, capillary     Status: Abnormal   Collection Time: 11/10/14  4:13 PM  Result Value Ref Range   Glucose-Capillary 126 (H) 65 - 99 mg/dL  Urine rapid drug screen (hosp performed)     Status: Abnormal   Collection Time: 11/10/14  7:18 PM  Result Value Ref Range    Opiates NONE DETECTED NONE DETECTED   Cocaine NONE DETECTED NONE DETECTED   Benzodiazepines POSITIVE (A) NONE DETECTED   Amphetamines NONE DETECTED NONE DETECTED   Tetrahydrocannabinol NONE DETECTED NONE DETECTED   Barbiturates NONE DETECTED NONE DETECTED    Comment:        DRUG SCREEN FOR MEDICAL PURPOSES ONLY.  IF CONFIRMATION IS NEEDED FOR ANY PURPOSE, NOTIFY LAB WITHIN 5 DAYS.        LOWEST DETECTABLE LIMITS FOR URINE DRUG SCREEN Drug Class       Cutoff (ng/mL) Amphetamine      1000 Barbiturate      200 Benzodiazepine   161 Tricyclics       096 Opiates          300 Cocaine          300 THC              50   Glucose, capillary     Status: Abnormal   Collection Time: 11/10/14  8:26 PM  Result Value Ref Range   Glucose-Capillary 189 (H) 65 - 99 mg/dL  Basic metabolic panel     Status: Abnormal   Collection Time: 11/11/14  5:25 AM  Result Value Ref Range   Sodium 139 135 - 145 mmol/L   Potassium 3.9 3.5 - 5.1 mmol/L   Chloride 108 101 - 111 mmol/L   CO2 25 22 - 32 mmol/L   Glucose, Bld 133 (H) 65 - 99 mg/dL   BUN 19 6 - 20 mg/dL   Creatinine, Ser 1.18 0.61 - 1.24 mg/dL   Calcium 8.2 (L) 8.9 - 10.3 mg/dL   GFR calc non Af Amer 54 (L) >60 mL/min   GFR calc Af Amer >60 >60 mL/min    Comment: (NOTE) The eGFR has been calculated using the CKD EPI equation. This calculation has not been validated in all clinical situations. eGFR's persistently <60 mL/min signify possible Chronic Kidney Disease.    Anion gap 6 5 - 15  CBC     Status: Abnormal   Collection Time: 11/11/14  5:25 AM  Result Value Ref Range   WBC 6.7 4.0 - 10.5 K/uL   RBC 3.68 (L) 4.22 - 5.81 MIL/uL   Hemoglobin 11.4 (L) 13.0 - 17.0 g/dL   HCT 33.9 (L) 39.0 - 52.0 %   MCV 92.1 78.0 - 100.0 fL   MCH 31.0 26.0 - 34.0 pg   MCHC 33.6 30.0 - 36.0 g/dL   RDW 15.6 (H) 11.5 - 15.5 %   Platelets 172 150 - 400 K/uL  Glucose, capillary     Status: Abnormal   Collection Time: 11/11/14  7:19 AM  Result Value Ref  Range   Glucose-Capillary 157 (H) 65 - 99 mg/dL  Glucose, capillary     Status: Abnormal   Collection Time: 11/11/14 11:37 AM  Result Value Ref Range   Glucose-Capillary 140 (H) 65 - 99 mg/dL  Glucose, capillary  Status: Abnormal   Collection Time: 11/11/14  4:37 PM  Result Value Ref Range   Glucose-Capillary 123 (H) 65 - 99 mg/dL  Comprehensive metabolic panel     Status: Abnormal   Collection Time: 11/12/14  5:19 AM  Result Value Ref Range   Sodium 139 135 - 145 mmol/L   Potassium 3.9 3.5 - 5.1 mmol/L   Chloride 110 101 - 111 mmol/L   CO2 24 22 - 32 mmol/L   Glucose, Bld 122 (H) 65 - 99 mg/dL   BUN 13 6 - 20 mg/dL   Creatinine, Ser 1.15 0.61 - 1.24 mg/dL   Calcium 8.0 (L) 8.9 - 10.3 mg/dL   Total Protein 5.0 (L) 6.5 - 8.1 g/dL   Albumin 2.5 (L) 3.5 - 5.0 g/dL   AST 13 (L) 15 - 41 U/L   ALT 10 (L) 17 - 63 U/L   Alkaline Phosphatase 46 38 - 126 U/L   Total Bilirubin 0.5 0.3 - 1.2 mg/dL   GFR calc non Af Amer 55 (L) >60 mL/min   GFR calc Af Amer >60 >60 mL/min    Comment: (NOTE) The eGFR has been calculated using the CKD EPI equation. This calculation has not been validated in all clinical situations. eGFR's persistently <60 mL/min signify possible Chronic Kidney Disease.    Anion gap 5 5 - 15  CBC     Status: Abnormal   Collection Time: 11/12/14  5:19 AM  Result Value Ref Range   WBC 6.4 4.0 - 10.5 K/uL   RBC 3.57 (L) 4.22 - 5.81 MIL/uL   Hemoglobin 11.1 (L) 13.0 - 17.0 g/dL   HCT 33.1 (L) 39.0 - 52.0 %   MCV 92.7 78.0 - 100.0 fL   MCH 31.1 26.0 - 34.0 pg   MCHC 33.5 30.0 - 36.0 g/dL   RDW 15.7 (H) 11.5 - 15.5 %   Platelets 165 150 - 400 K/uL  Glucose, capillary     Status: Abnormal   Collection Time: 11/12/14  7:51 AM  Result Value Ref Range   Glucose-Capillary 125 (H) 65 - 99 mg/dL  Troponin I     Status: None   Collection Time: 11/12/14  8:35 AM  Result Value Ref Range   Troponin I <0.03 <0.031 ng/mL    Comment:        NO INDICATION OF MYOCARDIAL  INJURY.   Glucose, capillary     Status: Abnormal   Collection Time: 11/12/14 11:37 AM  Result Value Ref Range   Glucose-Capillary 155 (H) 65 - 99 mg/dL    Vitals: Blood pressure 127/85, pulse 94, temperature 98.1 F (36.7 C), temperature source Oral, resp. rate 16, height 6' (1.829 m), weight 74.617 kg (164 lb 8 oz), SpO2 94 %.  Risk to Self: Is patient at risk for suicide?: No Risk to Others:   Prior Inpatient Therapy:   Prior Outpatient Therapy:    Current Facility-Administered Medications  Medication Dose Route Frequency Provider Last Rate Last Dose  . 0.9 % NaCl with KCl 20 mEq/ L  infusion   Intravenous Continuous Geradine Girt, DO 75 mL/hr at 11/12/14 1005    . acetaminophen (TYLENOL) tablet 650 mg  650 mg Oral Q6H PRN Geradine Girt, DO       Or  . acetaminophen (TYLENOL) suppository 650 mg  650 mg Rectal Q6H PRN Geradine Girt, DO      . apixaban (ELIQUIS) tablet 5 mg  5 mg Oral BID Jake Church Masters, [facility]  5 mg at 11/12/14 0803  . insulin aspart (novoLOG) injection 0-5 Units  0-5 Units Subcutaneous QHS Geradine Girt, DO   0 Units at 11/10/14 2237  . insulin aspart (novoLOG) injection 0-9 Units  0-9 Units Subcutaneous TID WC Geradine Girt, DO   2 Units at 11/12/14 1203  . levothyroxine (SYNTHROID, LEVOTHROID) tablet 100 mcg  100 mcg Oral QAC breakfast Geradine Girt, DO   100 mcg at 11/12/14 0803  . lidocaine (PF) (XYLOCAINE) 1 % injection 5 mL  5 mL Infiltration Once Tori Milks, MD      . LORazepam (ATIVAN) injection 0.5 mg  0.5 mg Intravenous Once Ripudeep K Rai, MD      . metoprolol tartrate (LOPRESSOR) 25 mg/10 mL oral suspension 6.25 mg  6.25 mg Oral BID Nita Sells, MD   6.25 mg at 11/12/14 0856  . ondansetron (ZOFRAN) tablet 4 mg  4 mg Oral Q6H PRN Geradine Girt, DO       Or  . ondansetron (ZOFRAN) injection 4 mg  4 mg Intravenous Q6H PRN Geradine Girt, DO      . sodium chloride 0.9 % injection 3 mL  3 mL Intravenous Q12H Jessica U Vann, DO   3 mL at  11/10/14 1545  . tamsulosin (FLOMAX) capsule 0.4 mg  0.4 mg Oral Daily Geradine Girt, DO   0.4 mg at 11/12/14 0803   Facility-Administered Medications Ordered in Other Encounters  Medication Dose Route Frequency Provider Last Rate Last Dose  . cyclopentolate (CYCLODRYL,CYCLOGYL) 1 % ophthalmic solution 1 drop  1 drop Left Eye PRN Hurman Horn, MD      . gatifloxacin (ZYMAXID) 0.5 % ophthalmic drops 1 drop  1 drop Left Eye PRN Hurman Horn, MD      . phenylephrine (MYDFRIN) 2.5 % ophthalmic solution 1 drop  1 drop Left Eye PRN Hurman Horn, MD        Musculoskeletal: Strength & Muscle Tone: within normal limits Gait & Station: normal Patient leans: N/A  Psychiatric Specialty Exam: Physical Exam as per history and physical  ROS denied sob, chest pain, nausea, vomiting and fever and body pains.   Blood pressure 127/85, pulse 94, temperature 98.1 F (36.7 C), temperature source Oral, resp. rate 16, height 6' (1.829 m), weight 74.617 kg (164 lb 8 oz), SpO2 94 %.Body mass index is 22.31 kg/(m^2).  General Appearance: Casual  Eye Contact::  Good  Speech:  Clear and Coherent  Volume:  Normal  Mood:  Anxious  Affect:  Appropriate and Congruent  Thought Process:  Coherent and Goal Directed  Orientation:  Full (Time, Place, and Person)  Thought Content:  WDL  Suicidal Thoughts:  No  Homicidal Thoughts:  No  Memory:  Immediate;   Fair Recent;   Fair  Judgement:  Intact  Insight:  Fair  Psychomotor Activity:  Normal  Concentration:  Good  Recall:  Good  Fund of Knowledge:Good  Language: Good  Akathisia:  Negative  Handed:  Right  AIMS (if indicated):     Assets:  Communication Skills Desire for Improvement Financial Resources/Insurance Housing Leisure Time Physical Health Resilience Social Support Talents/Skills Transportation  ADL's:  Intact  Cognition: Impaired,  Mild  Sleep:      Medical Decision Making: Review of Psycho-Social Stressors (1), Review or order  clinical lab tests (1), New Problem, with no additional work-up planned (3), Review of Last Therapy Session (1), Review or order medicine tests (1), Review of Medication  Regimen & Side Effects (2) and Review of New Medication or Change in Dosage (2)  Treatment Plan Summary: patient has mild cognitive deficits and recent fall which seems to be accidental. His medical work up is negative. He has no safety concrns.  Plan:  Patient meets criteria for capacity to make his own medical and living arrangement as per my evaluation today. No psych medication recommended Patient does not meet criteria for psychiatric inpatient admission. Supportive therapy provided about ongoing stressors.  Appreciate psychiatric consultation and will sign off at this time Please contact [phone] or [phone] if needs further assistance   Disposition: Discharge to home with home health care if needed.   Noelle Sease,JANARDHAHA R. 11/12/2014 3:14 PM

## 2014-11-12 NOTE — Progress Notes (Signed)
Physical Therapy Treatment Patient Details Name: Joe [name] MRN: [id number] DOB: July 02, 1924 Today's Date: 11/12/2014    History of Present Illness 81 y.o. with PMH of HTN, atrial fib and BPH. He presented to ER today after a fall. Patient states he was trying to put on his clothes when he fell. ER reports that he was opening the door for his yard-man when he fell onto his left side and his tile floor. Per report he had + LOC and + loss of bladder. Patient was recently in the hospital in April for new onset a fib and falls.Dx with orthostatic hypotension and dehydration.      PT Comments    Pt is making physical improvements and was able to walk without holding to the IV pole today, although signs of imbalance are still present.  He continues to have cognitive deficits making him unsafe to live alone AND unsafe to drive.  Pt adamantly refuses SNF placement for rehab, but is reluctantly open to [facility] services to come for a short period of time at home.  PT will continue to follow acutely until d/c.    Follow Up Recommendations  SNF;Supervision/Assistance - 24 hour (pt will likely refuse SNF, but is agreeable to [facility] services)     Equipment Recommendations  Other (comment);Kasandra Knudsen (pt will likely refuse a cane)    Recommendations for Other Services   NA     Precautions / Restrictions Precautions Precautions: Fall Precaution Comments: mildly unsteady on his feet Restrictions Weight Bearing Restrictions: No    Mobility  Bed Mobility Overal bed mobility: Modified Independent Bed Mobility: Sit to Supine;Supine to Sit     Supine to sit: Supervision Sit to supine: Supervision   General bed mobility comments: Pt able to get into and out of bed with the assit of the railing bed flat.   Transfers Overall transfer level: Needs assistance Equipment used: None Transfers: Sit to/from Stand Sit to Stand: Supervision         General transfer comment: supervision for safety,  uncontrolled descent and reliance on arms to help with transitions.   Ambulation/Gait Ambulation/Gait assistance: Min guard Ambulation Distance (Feet): 300 Feet Assistive device: None Gait Pattern/deviations: Step-through pattern;Staggering left;Staggering right Gait velocity: decreased Gait velocity interpretation: Below normal speed for age/gender General Gait Details: Pt continues to have a staggering gait pattern and when he can he reaches for stabilizing objects close to him in the hallway.  I had him walk without holding the IV pole today.    Stairs Stairs: Yes Stairs assistance: Supervision Stair Management: One rail Right;One rail Left;Alternating pattern;Forwards Number of Stairs: 4 General stair comments: Pt relying on railing, but able to preform reciprocally without LOB or signs of legs buckling.          Balance Overall balance assessment: Needs assistance Sitting-balance support: Feet supported;No upper extremity supported Sitting balance-Leahy Scale: Good     Standing balance support: Single extremity supported Standing balance-Leahy Scale: Fair                      Cognition Arousal/Alertness: Awake/alert Behavior During Therapy: Restless;Agitated Overall Cognitive Status: History of cognitive impairments - at baseline Area of Impairment: Safety/judgement;Orientation Orientation Level: Disoriented to;Time   Memory: Decreased short-term memory   Safety/Judgement: Decreased awareness of safety;Decreased awareness of deficits   Problem Solving: Difficulty sequencing General Comments: Pt continues to relay inaccurate information throughout session about home, etc.  Sister there to confirm accurate information.  General Comments General comments (skin integrity, edema, etc.): BP is normal today.  See vitals flow sheet.       Pertinent Vitals/Pain Pain Assessment: No/denies pain           PT Goals (current goals can now be found in  the care plan section) Acute Rehab PT Goals Patient Stated Goal: go home Progress towards PT goals: Progressing toward goals    Frequency  Min 3X/week    PT Plan Current plan remains appropriate       End of Session Equipment Utilized During Treatment: Gait belt Activity Tolerance: Patient tolerated treatment well Patient left: in bed;with call bell/phone within reach;with family/visitor present;with nursing/sitter in room     Time: 5217-4715 PT Time Calculation (min) (ACUTE ONLY): 16 min  Charges:  $Gait Training: 8-22 mins                      Kelle Ruppert B. [place], [place], DPT [phone]   11/12/2014, 1:52 PM

## 2014-11-12 NOTE — Discharge Summary (Signed)
Physician Discharge Summary   Patient ID: Joe [name] MRN: [id number] DOB/AGE: 1924-08-30 81 y.o.  Admit date: 11/10/2014 Discharge date: 11/12/2014  Primary Care Physician:  Dwan Bolt, MD  Discharge Diagnoses:    . Syncope and collapse . Hypothyroid . A-fib . Orthostatic hypotension . Hypothyroidism . BPH (benign prostatic hypertrophy)  Consults:  Psychiatry, Dr. Roberts Gaudy   Recommendations for Outpatient Follow-up:  Please follow BP at the time of appointment if patient is still orthostatic, may need to discontinue beta blocker as well.  Please note ramipril has been discontinued    DIET: Heart healthy diet    Allergies:   Allergies  Allergen Reactions  . No Known Allergies      Discharge Medications:   Medication List    STOP taking these medications        metoprolol tartrate 25 MG tablet  Commonly known as:  LOPRESSOR  Replaced by:  metoprolol tartrate 25 mg/10 mL Susp     mupirocin cream 2 %  Commonly known as:  BACTROBAN     ramipril 1.25 MG capsule  Commonly known as:  ALTACE     zolpidem 10 MG tablet  Commonly known as:  AMBIEN      TAKE these medications        apixaban 5 MG Tabs tablet  Commonly known as:  ELIQUIS  Take 1 tablet (5 mg total) by mouth 2 (two) times daily.     cetirizine 10 MG tablet  Commonly known as:  ZYRTEC  Take 10 mg by mouth daily as needed for allergies.     docusate sodium 100 MG capsule  Commonly known as:  COLACE  Take 100 mg by mouth daily as needed for mild constipation.     guaiFENesin 600 MG 12 hr tablet  Commonly known as:  MUCINEX  Take 600 mg by mouth 2 (two) times daily as needed for cough.     ibuprofen 200 MG tablet  Commonly known as:  ADVIL,MOTRIN  Take 200 mg by mouth every 6 (six) hours as needed for moderate pain.     INVOKANA 300 MG Tabs tablet  Generic drug:  canagliflozin  Take 300 mg by mouth daily before breakfast.     metoprolol tartrate 25 mg/10 mL Susp   Commonly known as:  LOPRESSOR  Take 2.5 mLs (6.25 mg total) by mouth 2 (two) times daily.     OSENI 25-30 MG Tabs  Generic drug:  Alogliptin-Pioglitazone  Take 1 tablet by mouth daily.     SYNTHROID 100 MCG tablet  Generic drug:  levothyroxine  Take 100 mcg by mouth daily.     tamsulosin 0.4 MG Caps capsule  Commonly known as:  FLOMAX  Take 0.4 mg by mouth daily.         Brief H and P: For complete details please refer to admission H and P, but in brief Joe [name] is a 81 y.o. male  With PMhx of HTN, atrial fib and BPH. He presents to ER today after a fall. Patient states he was trying to put on his clothes when he fell. ER reports that he was opening the door for his yard-man when he fell onto his left side and his tile floor. Per report he had + LOC and + loss of bladder. Patient was recently in the hospital in April for new onset a fib and falls. PT recommended he be d/c;d with home health and 24 hour supervision but no family is around to  help.    Hospital Course:   Syncope and collapse: Likely due to orthostasis, patient does not eat too well at home.  The patient was admitted for further workup. He was placed on IV fluid hydration although antihypertensives were held. Subsequently patient was restarted on metoprolol 6.25 mg BID, ramipril was discontinued. Troponins were negative. Patient had a recent 2-D echo in 4/16 which had shown EF of 46-96%, grade 1 diastolic dysfunction.   Hypothyroidism - Continue Synthroid, TSH 1.08   BPH (benign prostatic hypertrophy) Continue Flomax   Diabetes mellitus with no complication Continue outpatient oral hypoglycemic regimen   A-fib - Currently rate controlled, continue metoprolol for rate control - CAHDS vasc 4. On eliquis   Generalized debility  - PTOT evaluation recommending skilled nursing facility. However patient has been having encephalopathy issues, dementia with behavioral issues. CT head was  negative for acute CVA. UDS was positive for benzodiazepine.  - Patient however adamantly declined skilled nursing facility however patient has some cognitive deficits although he is able to converse appropriately and has orientation 3. Psychiatry was consulted, patient was seen by Dr. Zorita Pang. Per Dr Cottie Banda verbal report to me, patient has capacity to make his decisions despite a mild cognitive deficits which he may be developing due to dementia. Patient was then discharged home with home health PT, OT, home health RN, aide to maximize the resources.    Day of Discharge BP 127/85 mmHg  Pulse 94  Temp(Src) 98.1 F (36.7 C) (Oral)  Resp 16  Ht 6' (1.829 m)  Wt 74.617 kg (164 lb 8 oz)  BMI 22.31 kg/m2  SpO2 94%  Physical Exam: General: Alert and awake oriented x3 not in any acute distress. HEENT: anicteric sclera, pupils reactive to light and accommodation CVS: S1-S2 clear no murmur rubs or gallops Chest: clear to auscultation bilaterally, no wheezing rales or rhonchi Abdomen: soft nontender, nondistended, normal bowel sounds Extremities: no cyanosis, clubbing or edema noted bilaterally Neuro: Cranial nerves II-XII intact, no focal neurological deficits   The results of significant diagnostics from this hospitalization (including imaging, microbiology, ancillary and laboratory) are listed below for reference.    LAB RESULTS: Basic Metabolic Panel:  Recent Labs Lab 11/11/14 0525 11/12/14 0519  NA 139 139  K 3.9 3.9  CL 108 110  CO2 25 24  GLUCOSE 133* 122*  BUN 19 13  CREATININE 1.18 1.15  CALCIUM 8.2* 8.0*   Liver Function Tests:  Recent Labs Lab 11/10/14 1054 11/12/14 0519  AST 17 13*  ALT 12* 10*  ALKPHOS 60 46  BILITOT 0.5 0.5  PROT 6.3* 5.0*  ALBUMIN 3.3* 2.5*   No results for input(s): LIPASE, AMYLASE in the last 168 hours. No results for input(s): AMMONIA in the last 168 hours. CBC:  Recent Labs Lab 11/10/14 1054 11/11/14 0525  11/12/14 0519  WBC 9.6 6.7 6.4  NEUTROABS 6.8  --   --   HGB 13.1 11.4* 11.1*  HCT 38.5* 33.9* 33.1*  MCV 91.7 92.1 92.7  PLT 191 172 165   Cardiac Enzymes:  Recent Labs Lab 11/12/14 0835  TROPONINI <0.03   BNP: Invalid input(s): POCBNP CBG:  Recent Labs Lab 11/12/14 0751 11/12/14 1137  GLUCAP 125* 155*    Significant Diagnostic Studies:  Dg Chest 1 View  11/10/2014   CLINICAL DATA:  Status post fall.  No chest complaints.  EXAM: CHEST  1 VIEW  COMPARISON:  08/28/2014  FINDINGS: There are bilateral chronic bronchitic changes. There is no focal parenchymal opacity.  There is no pleural effusion or pneumothorax. The heart and mediastinal contours are unremarkable.  There is old right posterior rib deformity.  IMPRESSION: No active disease.   Electronically Signed   By: Kathreen Devoid   On: 11/10/2014 13:15   Ct Head Wo Contrast  11/10/2014   CLINICAL DATA:  [place] on tile floor.  Loss of consciousness.  EXAM: CT HEAD WITHOUT CONTRAST  CT MAXILLOFACIAL WITHOUT CONTRAST  CT CERVICAL SPINE WITHOUT CONTRAST  TECHNIQUE: Multidetector CT imaging of the head, cervical spine, and maxillofacial structures were performed using the standard protocol without intravenous contrast. Multiplanar CT image reconstructions of the cervical spine and maxillofacial structures were also generated.  COMPARISON:  08/28/2014  FINDINGS: CT HEAD FINDINGS  There is stable cerebral atrophy. Stable low-density throughout the white matter. No evidence for acute hemorrhage, mass lesion, midline shift, hydrocephalus or large infarct. There is left periorbital soft tissue swelling. Mucosal thickening and small polyps in left maxillary sinus. Mild mucosal thickening in the ethmoid air cells. Soft tissue lucency along the lateral left orbit is consistent with a skin laceration. There is a subtle fracture of the lateral left orbital wall.  CT MAXILLOFACIAL FINDINGS  There is left periorbital soft tissue swelling. Mild proptosis  the left orbit. There is a small focus of gas along the lateral left orbit compatible with a laceration. There is a small calcification in the left parotid tissue on sequence 2, image 11. There is a mildly comminuted and displaced fracture of the lateral left orbital wall. The zygomatic arches are intact. Pterygoid plates are intact. Mandibular condyles are located. There is mucosal thickening and small polyps in the left maxillary sinus. Mild mucosal thickening ethmoid air cells. Nasal septum is intact. Nasal bones are intact.  CT CERVICAL SPINE FINDINGS  Lung apices are clear. Multilevel degenerative facet disease. No evidence for an acute fracture or dislocation in the cervical spine. Disc osteophyte complex and bilateral foraminal narrowing at C3-C4. Disc osteophyte complex and bilateral foraminal narrowing at C4-C5. Right foraminal narrowing at C5-C6. There is no significant soft tissue swelling or lymphadenopathy in the neck. There is ankylosis of the right facets at C2-C3.  IMPRESSION: No acute intracranial abnormality. Atrophy and evidence for chronic small vessel ischemic disease.  Fracture of the left lateral orbital wall with left periorbital soft tissue swelling and a small focus of subcutaneous gas.  Multilevel degenerative changes in the cervical spine without acute bone abnormality.   Electronically Signed   By: Markus Daft M.D.   On: 11/10/2014 11:55   Ct Cervical Spine Wo Contrast  11/10/2014   CLINICAL DATA:  [place] on tile floor.  Loss of consciousness.  EXAM: CT HEAD WITHOUT CONTRAST  CT MAXILLOFACIAL WITHOUT CONTRAST  CT CERVICAL SPINE WITHOUT CONTRAST  TECHNIQUE: Multidetector CT imaging of the head, cervical spine, and maxillofacial structures were performed using the standard protocol without intravenous contrast. Multiplanar CT image reconstructions of the cervical spine and maxillofacial structures were also generated.  COMPARISON:  08/28/2014  FINDINGS: CT HEAD FINDINGS  There is stable  cerebral atrophy. Stable low-density throughout the white matter. No evidence for acute hemorrhage, mass lesion, midline shift, hydrocephalus or large infarct. There is left periorbital soft tissue swelling. Mucosal thickening and small polyps in left maxillary sinus. Mild mucosal thickening in the ethmoid air cells. Soft tissue lucency along the lateral left orbit is consistent with a skin laceration. There is a subtle fracture of the lateral left orbital wall.  CT MAXILLOFACIAL FINDINGS  There  is left periorbital soft tissue swelling. Mild proptosis the left orbit. There is a small focus of gas along the lateral left orbit compatible with a laceration. There is a small calcification in the left parotid tissue on sequence 2, image 11. There is a mildly comminuted and displaced fracture of the lateral left orbital wall. The zygomatic arches are intact. Pterygoid plates are intact. Mandibular condyles are located. There is mucosal thickening and small polyps in the left maxillary sinus. Mild mucosal thickening ethmoid air cells. Nasal septum is intact. Nasal bones are intact.  CT CERVICAL SPINE FINDINGS  Lung apices are clear. Multilevel degenerative facet disease. No evidence for an acute fracture or dislocation in the cervical spine. Disc osteophyte complex and bilateral foraminal narrowing at C3-C4. Disc osteophyte complex and bilateral foraminal narrowing at C4-C5. Right foraminal narrowing at C5-C6. There is no significant soft tissue swelling or lymphadenopathy in the neck. There is ankylosis of the right facets at C2-C3.  IMPRESSION: No acute intracranial abnormality. Atrophy and evidence for chronic small vessel ischemic disease.  Fracture of the left lateral orbital wall with left periorbital soft tissue swelling and a small focus of subcutaneous gas.  Multilevel degenerative changes in the cervical spine without acute bone abnormality.   Electronically Signed   By: Markus Daft M.D.   On: 11/10/2014 11:55    Ct Maxillofacial Wo Cm  11/10/2014   CLINICAL DATA:  [place] on tile floor.  Loss of consciousness.  EXAM: CT HEAD WITHOUT CONTRAST  CT MAXILLOFACIAL WITHOUT CONTRAST  CT CERVICAL SPINE WITHOUT CONTRAST  TECHNIQUE: Multidetector CT imaging of the head, cervical spine, and maxillofacial structures were performed using the standard protocol without intravenous contrast. Multiplanar CT image reconstructions of the cervical spine and maxillofacial structures were also generated.  COMPARISON:  08/28/2014  FINDINGS: CT HEAD FINDINGS  There is stable cerebral atrophy. Stable low-density throughout the white matter. No evidence for acute hemorrhage, mass lesion, midline shift, hydrocephalus or large infarct. There is left periorbital soft tissue swelling. Mucosal thickening and small polyps in left maxillary sinus. Mild mucosal thickening in the ethmoid air cells. Soft tissue lucency along the lateral left orbit is consistent with a skin laceration. There is a subtle fracture of the lateral left orbital wall.  CT MAXILLOFACIAL FINDINGS  There is left periorbital soft tissue swelling. Mild proptosis the left orbit. There is a small focus of gas along the lateral left orbit compatible with a laceration. There is a small calcification in the left parotid tissue on sequence 2, image 11. There is a mildly comminuted and displaced fracture of the lateral left orbital wall. The zygomatic arches are intact. Pterygoid plates are intact. Mandibular condyles are located. There is mucosal thickening and small polyps in the left maxillary sinus. Mild mucosal thickening ethmoid air cells. Nasal septum is intact. Nasal bones are intact.  CT CERVICAL SPINE FINDINGS  Lung apices are clear. Multilevel degenerative facet disease. No evidence for an acute fracture or dislocation in the cervical spine. Disc osteophyte complex and bilateral foraminal narrowing at C3-C4. Disc osteophyte complex and bilateral foraminal narrowing at C4-C5. Right  foraminal narrowing at C5-C6. There is no significant soft tissue swelling or lymphadenopathy in the neck. There is ankylosis of the right facets at C2-C3.  IMPRESSION: No acute intracranial abnormality. Atrophy and evidence for chronic small vessel ischemic disease.  Fracture of the left lateral orbital wall with left periorbital soft tissue swelling and a small focus of subcutaneous gas.  Multilevel degenerative changes in  the cervical spine without acute bone abnormality.   Electronically Signed   By: Markus Daft M.D.   On: 11/10/2014 11:55       Disposition and Follow-up:     Discharge Instructions    Diet - low sodium heart healthy    Complete by:  As directed      Increase activity slowly    Complete by:  As directed             DISPOSITION: home with home health   DISCHARGE FOLLOW-UP Follow-up Information    Follow up with Dwan Bolt, MD. Schedule an appointment as soon as possible for a visit in 10 days.   Specialty:  Endocrinology   Why:  for hospital follow-up   Contact information:   [address] [place] [address] [phone]        Time spent on Discharge: 35 mins   Signed:   Keundra Petrucelli M.D. [facility] 11/12/2014, 4:00 PM Pager: [phone]

## 2014-11-12 NOTE — Progress Notes (Signed)
[facility]                                                                              Patient Demographics  Joe [name], is a 81 y.o. male, DOB - [birth year], EPP:[id number]  Admit date - 11/10/2014   Admitting Physician Debby Freiberg, MD  Outpatient Primary MD for the patient is Dwan Bolt, MD  LOS -    Chief Complaint  Patient presents with  . Fall       Brief HPI   Joe [name] is a 81 y.o. male  With PMhx of HTN, atrial fib and BPH. He presents to ER today after a fall. Patient states he was trying to put on his clothes when he fell. ER reports that he was opening the door for his yard-man when he fell onto his left side and his tile floor. Per report he had + LOC and + loss of bladder. Patient was recently in the hospital in April for new onset a fib and falls. PT recommended he be d/c;d with home health and 24 hour supervision but no family is around to help.    Assessment & Plan    Principal Problem:   Syncope and collapse: Likely due to orthostasis, patient does not eat too well at home - Continue gentle hydration, hold antihypertensives - Troponins negative,, 2-D echo pending -PT evaluation recommended skilled nursing facility   Active Problems:    Hypothyroidism - Continue Synthroid    BPH (benign prostatic hypertrophy) Continue Flomax    Diabetes mellitus with no complication - Continue sliding scale insulin    A-fib - Currently rate controlled, continue metoprolol, -  CAHDS vasc 4. On eliquis   Generalized debility  - PTOT evaluation recommending skilled nursing facility. However patient has been having encephalopathy issues, dementia with behavioral issues. CT head was negative for acute CVA. UDS was positive for benzodiazepine.  - Patient however declining for skilled nursing facility however his confusion has been worsening, able to converse but off. 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 apixaban  5 mg Oral BID  . insulin aspart  0-5 Units Subcutaneous QHS  . insulin aspart  0-9 Units Subcutaneous TID WC  . levothyroxine  100 mcg Oral QAC breakfast  . lidocaine (PF)  5 mL Infiltration Once  . metoprolol tartrate  6.25 mg Oral BID  . sodium chloride  3 mL Intravenous Q12H  . tamsulosin  0.4 mg Oral Daily   Continuous Infusions: . 0.9 % NaCl with KCl 20 mEq / L 75 mL/hr at 11/12/14 1005   PRN Meds:.acetaminophen **OR** acetaminophen, ondansetron **OR** ondansetron (ZOFRAN)  IV   Antibiotics   Anti-infectives    None        Subjective:   Ociel Retherford was seen and examined today.Denies any specific complaints, sitter at the bedside, continues to jump out of the bed. Denies any specific complaints. Does not want to go to skilled nursing facility. Patient denies dizziness, chest pain, shortness of breath, abdominal pain, N/V/D/C, new weakness, numbess, tingling.   Objective:   Blood pressure 96/58, pulse 78, temperature 97.8 F (36.6 C), temperature source Oral, resp. rate 18, height 6' (1.829 m), weight 74.617 kg (164 lb 8 oz), SpO2 94 %.  Wt Readings from Last 3 Encounters:  11/10/14 74.617 kg (164 lb 8 oz)  09/08/14 78.336 kg (172 lb 11.2 oz)  08/29/14 77.021 kg (169 lb 12.8 oz)     Intake/Output Summary (Last 24 hours) at 11/12/14 1115 Last data filed at 11/12/14 0900  Gross per 24 hour  Intake 2096.25 ml  Output    575 ml  Net 1521.25 ml    Exam  General: Alert and oriented x  3, NAD  HEENT:  PERRLA, EOMI, Anicteric Sclera, mucous membranes moist.   Neck: Supple, no JVD, no masses  CVS: S1 S2 auscultated, no rubs, murmurs or gallops. Regular rate and rhythm.  Respiratory: Clear to auscultation bilaterally, no wheezing, rales or rhonchi  Abdomen: Soft, nontender, nondistended, + bowel sounds  Ext: no cyanosis clubbing or edema  Neuro: AAOx3, Cr N's II- XII. Strength 5/5 upper and lower extremities bilaterally  Skin: No rashes  Psych: anxious, alert and oriented x3    Data Review   Micro Results No results found for this or any previous visit (from the past 240 hour(s)).  Radiology Reports Dg Chest 1 View  11/10/2014   CLINICAL DATA:  Status post fall.  No chest complaints.  EXAM: CHEST  1 VIEW  COMPARISON:  08/28/2014  FINDINGS: There are bilateral chronic bronchitic changes. There is no focal parenchymal opacity. There is no pleural effusion or pneumothorax. The heart and mediastinal contours are unremarkable.  There is old right posterior rib deformity.  IMPRESSION: No active disease.   Electronically Signed   By: Kathreen Devoid   On: 11/10/2014 13:15   Ct Head Wo Contrast  11/10/2014   CLINICAL DATA:  [place] on tile floor.  Loss of consciousness.  EXAM: CT HEAD WITHOUT CONTRAST  CT MAXILLOFACIAL WITHOUT CONTRAST  CT CERVICAL SPINE WITHOUT CONTRAST  TECHNIQUE: Multidetector CT imaging of the head, cervical spine, and maxillofacial structures were performed using the standard protocol without intravenous contrast. Multiplanar CT image reconstructions of the cervical spine and maxillofacial structures were also generated.  COMPARISON:  08/28/2014  FINDINGS: CT HEAD FINDINGS  There is stable cerebral atrophy. Stable low-density throughout the white matter. No evidence for acute hemorrhage, mass lesion, midline shift, hydrocephalus or large infarct. There is left periorbital soft tissue swelling. Mucosal thickening and small polyps in left maxillary sinus. Mild  mucosal thickening in the ethmoid air cells. Soft tissue lucency along the lateral left orbit is consistent with a skin laceration. There is a subtle fracture of the lateral left orbital wall.  CT MAXILLOFACIAL FINDINGS  There is left periorbital soft tissue swelling. Mild proptosis the left orbit. There is a small focus of gas along the lateral left orbit compatible with a laceration. There is a small calcification in the left parotid tissue on sequence 2, image 11. There is a mildly comminuted and displaced fracture of the lateral left orbital wall.  The zygomatic arches are intact. Pterygoid plates are intact. Mandibular condyles are located. There is mucosal thickening and small polyps in the left maxillary sinus. Mild mucosal thickening ethmoid air cells. Nasal septum is intact. Nasal bones are intact.  CT CERVICAL SPINE FINDINGS  Lung apices are clear. Multilevel degenerative facet disease. No evidence for an acute fracture or dislocation in the cervical spine. Disc osteophyte complex and bilateral foraminal narrowing at C3-C4. Disc osteophyte complex and bilateral foraminal narrowing at C4-C5. Right foraminal narrowing at C5-C6. There is no significant soft tissue swelling or lymphadenopathy in the neck. There is ankylosis of the right facets at C2-C3.  IMPRESSION: No acute intracranial abnormality. Atrophy and evidence for chronic small vessel ischemic disease.  Fracture of the left lateral orbital wall with left periorbital soft tissue swelling and a small focus of subcutaneous gas.  Multilevel degenerative changes in the cervical spine without acute bone abnormality.   Electronically Signed   By: Markus Daft M.D.   On: 11/10/2014 11:55   Ct Cervical Spine Wo Contrast  11/10/2014   CLINICAL DATA:  [place] on tile floor.  Loss of consciousness.  EXAM: CT HEAD WITHOUT CONTRAST  CT MAXILLOFACIAL WITHOUT CONTRAST  CT CERVICAL SPINE WITHOUT CONTRAST  TECHNIQUE: Multidetector CT imaging of the head, cervical spine,  and maxillofacial structures were performed using the standard protocol without intravenous contrast. Multiplanar CT image reconstructions of the cervical spine and maxillofacial structures were also generated.  COMPARISON:  08/28/2014  FINDINGS: CT HEAD FINDINGS  There is stable cerebral atrophy. Stable low-density throughout the white matter. No evidence for acute hemorrhage, mass lesion, midline shift, hydrocephalus or large infarct. There is left periorbital soft tissue swelling. Mucosal thickening and small polyps in left maxillary sinus. Mild mucosal thickening in the ethmoid air cells. Soft tissue lucency along the lateral left orbit is consistent with a skin laceration. There is a subtle fracture of the lateral left orbital wall.  CT MAXILLOFACIAL FINDINGS  There is left periorbital soft tissue swelling. Mild proptosis the left orbit. There is a small focus of gas along the lateral left orbit compatible with a laceration. There is a small calcification in the left parotid tissue on sequence 2, image 11. There is a mildly comminuted and displaced fracture of the lateral left orbital wall. The zygomatic arches are intact. Pterygoid plates are intact. Mandibular condyles are located. There is mucosal thickening and small polyps in the left maxillary sinus. Mild mucosal thickening ethmoid air cells. Nasal septum is intact. Nasal bones are intact.  CT CERVICAL SPINE FINDINGS  Lung apices are clear. Multilevel degenerative facet disease. No evidence for an acute fracture or dislocation in the cervical spine. Disc osteophyte complex and bilateral foraminal narrowing at C3-C4. Disc osteophyte complex and bilateral foraminal narrowing at C4-C5. Right foraminal narrowing at C5-C6. There is no significant soft tissue swelling or lymphadenopathy in the neck. There is ankylosis of the right facets at C2-C3.  IMPRESSION: No acute intracranial abnormality. Atrophy and evidence for chronic small vessel ischemic disease.   Fracture of the left lateral orbital wall with left periorbital soft tissue swelling and a small focus of subcutaneous gas.  Multilevel degenerative changes in the cervical spine without acute bone abnormality.   Electronically Signed   By: Markus Daft M.D.   On: 11/10/2014 11:55   Ct Maxillofacial Wo Cm  11/10/2014   CLINICAL DATA:  [place] on tile floor.  Loss of consciousness.  EXAM: CT HEAD WITHOUT CONTRAST  CT MAXILLOFACIAL WITHOUT CONTRAST  CT CERVICAL SPINE WITHOUT CONTRAST  TECHNIQUE: Multidetector CT imaging of the head, cervical spine, and maxillofacial structures were performed using the standard protocol without intravenous contrast. Multiplanar CT image reconstructions of the cervical spine and maxillofacial structures were also generated.  COMPARISON:  08/28/2014  FINDINGS: CT HEAD FINDINGS  There is stable cerebral atrophy. Stable low-density throughout the white matter. No evidence for acute hemorrhage, mass lesion, midline shift, hydrocephalus or large infarct. There is left periorbital soft tissue swelling. Mucosal thickening and small polyps in left maxillary sinus. Mild mucosal thickening in the ethmoid air cells. Soft tissue lucency along the lateral left orbit is consistent with a skin laceration. There is a subtle fracture of the lateral left orbital wall.  CT MAXILLOFACIAL FINDINGS  There is left periorbital soft tissue swelling. Mild proptosis the left orbit. There is a small focus of gas along the lateral left orbit compatible with a laceration. There is a small calcification in the left parotid tissue on sequence 2, image 11. There is a mildly comminuted and displaced fracture of the lateral left orbital wall. The zygomatic arches are intact. Pterygoid plates are intact. Mandibular condyles are located. There is mucosal thickening and small polyps in the left maxillary sinus. Mild mucosal thickening ethmoid air cells. Nasal septum is intact. Nasal bones are intact.  CT CERVICAL SPINE  FINDINGS  Lung apices are clear. Multilevel degenerative facet disease. No evidence for an acute fracture or dislocation in the cervical spine. Disc osteophyte complex and bilateral foraminal narrowing at C3-C4. Disc osteophyte complex and bilateral foraminal narrowing at C4-C5. Right foraminal narrowing at C5-C6. There is no significant soft tissue swelling or lymphadenopathy in the neck. There is ankylosis of the right facets at C2-C3.  IMPRESSION: No acute intracranial abnormality. Atrophy and evidence for chronic small vessel ischemic disease.  Fracture of the left lateral orbital wall with left periorbital soft tissue swelling and a small focus of subcutaneous gas.  Multilevel degenerative changes in the cervical spine without acute bone abnormality.   Electronically Signed   By: Markus Daft M.D.   On: 11/10/2014 11:55    CBC  Recent Labs Lab 11/10/14 1054 11/11/14 0525 11/12/14 0519  WBC 9.6 6.7 6.4  HGB 13.1 11.4* 11.1*  HCT 38.5* 33.9* 33.1*  PLT 191 172 165  MCV 91.7 92.1 92.7  MCH 31.2 31.0 31.1  MCHC 34.0 33.6 33.5  RDW 15.3 15.6* 15.7*  LYMPHSABS 1.8  --   --   MONOABS 0.8  --   --   EOSABS 0.1  --   --   BASOSABS 0.0  --   --     Chemistries   Recent Labs Lab 11/10/14 1054 11/11/14 0525 11/12/14 0519  NA 135 139 139  K 3.9 3.9 3.9  CL 102 108 110  CO2 25 25 24   GLUCOSE 182* 133* 122*  BUN 32* 19 13  CREATININE 1.74* 1.18 1.15  CALCIUM 9.0 8.2* 8.0*  AST 17  --  13*  ALT 12*  --  10*  ALKPHOS 60  --  46  BILITOT 0.5  --  0.5   ------------------------------------------------------------------------------------------------------------------ estimated creatinine clearance is 47.8 mL/min (by C-G formula based on Cr of 1.15). ------------------------------------------------------------------------------------------------------------------ No results for input(s): HGBA1C in the last 72  hours. ------------------------------------------------------------------------------------------------------------------ No results for input(s): CHOL, HDL, LDLCALC, TRIG, CHOLHDL, LDLDIRECT in the last 72 hours. ------------------------------------------------------------------------------------------------------------------ No results for input(s): TSH, T4TOTAL, T3FREE, THYROIDAB in the last 72 hours.  Invalid input(s): FREET3 ------------------------------------------------------------------------------------------------------------------ No results for  input(s): VITAMINB12, FOLATE, FERRITIN, TIBC, IRON, RETICCTPCT in the last 72 hours.  Coagulation profile  Recent Labs Lab 11/10/14 1054  INR 1.35    No results for input(s): DDIMER in the last 72 hours.  Cardiac Enzymes  Recent Labs Lab 11/12/14 0835  TROPONINI <0.03   ------------------------------------------------------------------------------------------------------------------ Invalid input(s): POCBNP   Recent Labs  11/10/14 1613 11/10/14 2026 11/11/14 0719 11/11/14 1137 11/11/14 1637 11/12/14 0751  GLUCAP 126* 189* 157* 140* 123* 125*     RAI,RIPUDEEP M.D. [facility] 11/12/2014, 11:15 AM  Pager: [phone] Between 7am to 7pm - call Pager - [phone]  After 7pm go to [email or web address] - password [email or web address]  Call night coverage person covering after 7pm

## 2014-11-12 NOTE — Progress Notes (Signed)
  Echocardiogram 2D Echocardiogram has been performed.  [name] 11/12/2014, 4:30 PM

## 2014-11-12 NOTE — Care Management Note (Addendum)
Case Management Note  Patient Details  Name: Joe [name] MRN: [id number] Date of Birth: 02/22/1925  Subjective/Objective:    Pt admitted for Syncope and collapse.             Action/Plan: Plan for home with [facility] services. Pt did choose [facility] for services. CM did make referral with [facility] and SOC to begin within 24-48 hrs of d/c.   Expected Discharge Date:                  Expected Discharge Plan:  [place]  In-House Referral:     Discharge planning Services  CM Consult  Post Acute Care Choice:    Choice offered to:  Patient  DME Arranged:    DME Agency:   HH Arranged:  RN, Disease Management, PT, OT, Nurse's Aide [place] Agency:  [facility]  Status of Service:  Completed, signed off  Medicare Important Message Given:    Date Medicare IM Given:    Medicare IM give by:    Date Additional Medicare IM Given:    Additional Medicare Important Message give by:     If discussed at [place] of Stay Meetings, dates discussed:    Additional Comments:  1632 11-12-14 Jacqlyn Krauss, RN, BSN [phone] [facility] will not be able to take pt due to PCP. CM did call [facility] to see if they can accept pt with PCP. Awaiting call back. [facility] will to accept pt and f/u appointment made. No further needs from CM at this time.     Bethena Roys, RN 11/12/2014, 4:21 PM

## 2014-11-12 NOTE — Progress Notes (Signed)
Occupational Therapy Treatment Patient Details Name: Joe [name] MRN: [id number] DOB: 16-Apr-1925 Today's Date: 11/12/2014    History of present illness 81 y.o. with PMH of HTN, atrial fib and BPH. He presented to ER today after a fall. Patient states he was trying to put on his clothes when he fell. ER reports that he was opening the door for his yard-man when he fell onto his left side and his tile floor. Per report he had + LOC and + loss of bladder. Patient was recently in the hospital in April for new onset a fib and falls.Dx with orthostatic hypotension and dehydration.     OT comments  Pt continues to refuse SNF and insists on going home. Continue to recommend SNF, but if pt continues to refuse, recommend HHOT.  Follow Up Recommendations  SNF;Supervision/Assistance - 24 hour    Equipment Recommendations  Other (comment) (TBD)    Recommendations for Other Services      Precautions / Restrictions Precautions Precautions: Fall Restrictions Weight Bearing Restrictions: No       Mobility Bed Mobility Overal bed mobility: Needs Assistance Bed Mobility: Sit to Supine;Supine to Sit     Supine to sit: Supervision Sit to supine: Supervision   General bed mobility comments: assist to scoot HOB and cues given  Transfers Overall transfer level: Needs assistance   Transfers: Sit to/from Stand Sit to Stand: Min guard;Supervision         General transfer comment: instructed pt to stand for a little while.    Balance  LOB in hallway during ambulation-Min assist.                                 ADL Overall ADL's : Needs assistance/impaired     Grooming: Brushing hair;Set up;Supervision/safety;Min guard;Oral care;Standing Grooming Details (indicate cue type and reason): Min guard-brushing hair (further away from sink than oral care)             Lower Body Dressing: Sit to/from stand;Minimal assistance (donned underwear) Lower Body Dressing  Details (indicate cue type and reason): pt's sister assisted, however feel pt could have performed with supervision/setup assist. Toilet Transfer: Min guard;Minimal assistance;Ambulation (sit to stand from bed; pt pushed IV pole some)           Functional mobility during ADLs: Min guard;Minimal assistance (pushed IV pole some) General ADL Comments: Spoke with pt's sister towards end of session about d/c recommendation and recommended her pick up rugs/items on floor for safety. Pt able to say that he needed to sit to don underwear.       Vision                     Perception     Praxis      Cognition  Awake/Alert Behavior During Therapy: WFL for tasks assessed/performed Overall Cognitive Status:  (reports pt at baseline) Area of Impairment: Safety/judgement;Orientation Orientation Level: Disoriented to;Time        Safety/Judgement: Decreased awareness of safety;Decreased awareness of deficits          Extremity/Trunk Assessment               Exercises     Shoulder Instructions       General Comments      Pertinent Vitals/ Pain       Pain Assessment: No/denies pain  Home Living  Prior Functioning/Environment              Frequency Min 2X/week     Progress Toward Goals  OT Goals(current goals can now be found in the care plan section)  Progress towards OT goals: Progressing toward goals  Acute Rehab OT Goals Patient Stated Goal: go home OT Goal Formulation: With patient Time For Goal Achievement: 11/18/14 Potential to Achieve Goals: Good ADL Goals Pt Will Perform Grooming: with set-up;standing Pt Will Perform Lower Body Dressing: with set-up;sit to/from stand Pt Will Transfer to Toilet: with supervision;ambulating (elevated toilet) Pt Will Perform Toileting - Clothing Manipulation and hygiene: sit to/from stand;with supervision Additional ADL Goal #1: Pt will independently  perform bilateral UE exercise to increase strength.  Plan Discharge plan remains appropriate    Co-evaluation                 End of Session Equipment Utilized During Treatment: Gait belt   Activity Tolerance Patient tolerated treatment well   Patient Left in bed;with call bell/phone within reach;with bed alarm set;with family/visitor present   Nurse Communication Mobility status;Other (comment) (pt in bed with bed alarm set)        Time: 1206-1222 OT Time Calculation (min): 16 min  Charges: OT General Charges $OT Visit: 1 Procedure OT Treatments $Self Care/Home Management : 8-22 mins    Benito Mccreedy OTR/L [phone] 11/12/2014, 12:36 PM

## 2014-11-13 DIAGNOSIS — F4323 Adjustment disorder with mixed anxiety and depressed mood: Secondary | ICD-10-CM | POA: Clinically undetermined

## 2014-11-13 DIAGNOSIS — R4189 Other symptoms and signs involving cognitive functions and awareness: Secondary | ICD-10-CM | POA: Clinically undetermined

## 2014-11-15 NOTE — Progress Notes (Signed)
Pt's sister [name] called requesting help to figure out which new medication patient was suppose to be on as she "lost the prescription" given to her and the patient on discharge (7/13). Per [name], she was going today (7/16) to the pharmacy to get his "new" medication filled when she couldn't find the prescription and the patient "had no idea where it was or could have been." Upon review of the chart and a long lengthy discussion with [name], it was determined that it was the metoprolol suspension that she was missing. I offered to speak with the MD that discharged the patient and request that it be called in to the pharmacy so she can pick it up today as patient hasn't had the medication since the morning of 7/13, prior to the patient being discharged from the hospital. [name] was very angry and upset that medications were not called in to begin with. I encouraged her to request that all medications be called in to patient's pharmacy prior to the patient being discharged instead of having hard scripts. MD paged for request of medication to be called in to [facility] on [address].

## 2014-11-17 DIAGNOSIS — F5101 Primary insomnia: Secondary | ICD-10-CM | POA: Diagnosis not present

## 2014-11-17 DIAGNOSIS — I482 Chronic atrial fibrillation: Secondary | ICD-10-CM | POA: Diagnosis not present

## 2014-11-17 LAB — GLUCOSE, CAPILLARY: GLUCOSE-CAPILLARY: 136 mg/dL — AB (ref 65–99)

## 2014-11-18 DIAGNOSIS — F5101 Primary insomnia: Secondary | ICD-10-CM | POA: Diagnosis not present

## 2014-12-05 DIAGNOSIS — H18413 Arcus senilis, bilateral: Secondary | ICD-10-CM | POA: Diagnosis not present

## 2014-12-05 DIAGNOSIS — H3532 Exudative age-related macular degeneration: Secondary | ICD-10-CM | POA: Diagnosis not present

## 2014-12-05 DIAGNOSIS — Z961 Presence of intraocular lens: Secondary | ICD-10-CM | POA: Diagnosis not present

## 2014-12-17 ENCOUNTER — Encounter (INDEPENDENT_AMBULATORY_CARE_PROVIDER_SITE_OTHER): Payer: Medicare Other | Admitting: Ophthalmology

## 2014-12-27 ENCOUNTER — Ambulatory Visit (INDEPENDENT_AMBULATORY_CARE_PROVIDER_SITE_OTHER): Payer: Medicare Other | Admitting: Family Medicine

## 2014-12-27 ENCOUNTER — Ambulatory Visit (INDEPENDENT_AMBULATORY_CARE_PROVIDER_SITE_OTHER): Payer: Medicare Other

## 2014-12-27 VITALS — BP 122/74 | HR 84 | Temp 97.3°F | Ht 69.5 in | Wt 159.6 lb

## 2014-12-27 DIAGNOSIS — R2681 Unsteadiness on feet: Secondary | ICD-10-CM | POA: Diagnosis not present

## 2014-12-27 DIAGNOSIS — E038 Other specified hypothyroidism: Secondary | ICD-10-CM

## 2014-12-27 DIAGNOSIS — E119 Type 2 diabetes mellitus without complications: Secondary | ICD-10-CM

## 2014-12-27 DIAGNOSIS — I481 Persistent atrial fibrillation: Secondary | ICD-10-CM

## 2014-12-27 DIAGNOSIS — E039 Hypothyroidism, unspecified: Secondary | ICD-10-CM | POA: Diagnosis not present

## 2014-12-27 DIAGNOSIS — I4819 Other persistent atrial fibrillation: Secondary | ICD-10-CM

## 2014-12-27 DIAGNOSIS — R634 Abnormal weight loss: Secondary | ICD-10-CM

## 2014-12-27 DIAGNOSIS — F411 Generalized anxiety disorder: Secondary | ICD-10-CM | POA: Diagnosis not present

## 2014-12-27 LAB — COMPLETE METABOLIC PANEL WITH GFR
ALT: 9 U/L (ref 9–46)
AST: 13 U/L (ref 10–35)
Albumin: 3.6 g/dL (ref 3.6–5.1)
Alkaline Phosphatase: 72 U/L (ref 40–115)
BUN: 17 mg/dL (ref 7–25)
CO2: 28 mmol/L (ref 20–31)
Calcium: 8.9 mg/dL (ref 8.6–10.3)
Chloride: 98 mmol/L (ref 98–110)
Creat: 1.14 mg/dL — ABNORMAL HIGH (ref 0.70–1.11)
GFR, Est African American: 66 mL/min (ref >=60)
GFR, Est Non African American: 58 mL/min — ABNORMAL LOW (ref >=60)
Glucose, Bld: 206 mg/dL — ABNORMAL HIGH (ref 65–99)
Potassium: 4.3 mmol/L (ref 3.5–5.3)
Sodium: 136 mmol/L (ref 135–146)
Total Bilirubin: 0.6 mg/dL (ref 0.2–1.2)
Total Protein: 6.4 g/dL (ref 6.1–8.1)

## 2014-12-27 LAB — POCT CBC
Granulocyte percent: 70.4 %G (ref 37–80)
HCT, POC: 44 % (ref 43.5–53.7)
Hemoglobin: 14 g/dL — AB (ref 14.1–18.1)
Lymph, poc: 2.3 (ref 0.6–3.4)
MCH, POC: 29.9 pg (ref 27–31.2)
MCHC: 31.8 g/dL (ref 31.8–35.4)
MCV: 94.2 fL (ref 80–97)
MID (cbc): 0.7 (ref 0–0.9)
MPV: 7.2 fL (ref 0–99.8)
POC Granulocyte: 7 — AB (ref 2–6.9)
POC LYMPH PERCENT: 22.7 %L (ref 10–50)
POC MID %: 6.9 %M (ref 0–12)
Platelet Count, POC: 273 10*3/uL (ref 142–424)
RBC: 4.67 M/uL — AB (ref 4.69–6.13)
RDW, POC: 15.9 %
WBC: 10 10*3/uL (ref 4.6–10.2)

## 2014-12-27 LAB — TSH: TSH: 7.772 u[IU]/mL — ABNORMAL HIGH (ref 0.350–4.500)

## 2014-12-27 LAB — POCT GLYCOSYLATED HEMOGLOBIN (HGB A1C): Hemoglobin A1C: 7.5

## 2014-12-27 LAB — VITAMIN B12: Vitamin B-12: 396 pg/mL (ref 211–911)

## 2014-12-27 MED ORDER — APIXABAN 5 MG PO TABS: 5.0000 mg | ORAL_TABLET | Freq: Two times a day (BID) | ORAL | Status: DC

## 2014-12-27 MED ORDER — SYNTHROID 100 MCG PO TABS: 100.0000 ug | ORAL_TABLET | Freq: Every day | ORAL | Status: DC

## 2014-12-27 MED ORDER — RAMIPRIL 5 MG PO CAPS: 5.0000 mg | ORAL_CAPSULE | Freq: Every day | ORAL | Status: DC

## 2014-12-27 MED ORDER — CITALOPRAM HYDROBROMIDE 20 MG PO TABS: 20.0000 mg | ORAL_TABLET | Freq: Every day | ORAL | Status: DC

## 2014-12-27 NOTE — Patient Instructions (Signed)
I will be sending a visiting nurse out to see you. I would like to see you back here in one month for follow-up visit.  You need to continue your glaucoma drops, the REM a peripheral, and the thyroid medicine. I would also like you taking an Advil twice a day. Risks of the medicine I would like you to hold for now.

## 2014-12-27 NOTE — Progress Notes (Addendum)
@[facility] @  This chart was scribed for Robyn Haber, MD by Thea Alken, ED Scribe. This patient was seen in room 3 and the patient's care was started at 10:15 AM.   Patient ID: Joe [name] MRN: [id number], DOB: 01-21-1925, 81 y.o. Date of Encounter: 12/27/2014, 10:59 AM  Primary Physician: Dwan Bolt, MD  Chief Complaint:  Chief Complaint  Patient presents with   Dizziness    and loss of balance x's 1 month    HPI:  81 year old male brought in by his sister with history below presents with dizziness. Pt reports unsteady gait and dizziness. Pt states he fell 3 times within the past week. Pt has not been eating and has lost 25 lbs, unintentionally. Pt brought with him a bag of medications. His sister is unsure if pt is needing all medications and would like to sort through them. Pt lives alone but has someone to come and fix him sandwiches. His sister  has tried to get him some assistance from a friend a church but states he is hard to please.      Past Medical History  Diagnosis Date   Thyroid disease    Anxiety    Hypertension     /notes 08/28/2014   BPH (benign prostatic hypertrophy)     [name] 08/28/2014   Atrial fibrillation with RVR 08/28/2014    [name] 08/28/2014   Hypothyroidism     /notes 08/28/2014     Home Meds: Prior to Admission medications   Medication Sig Start Date End Date Taking? Authorizing Provider  Alogliptin-Pioglitazone (OSENI) 25-30 MG TABS Take 1 tablet by mouth daily.   Yes Historical Provider, MD  apixaban (ELIQUIS) 5 MG TABS tablet Take 1 tablet (5 mg total) by mouth 2 (two) times daily. 08/29/14  Yes Thurnell Lose, MD  canagliflozin (INVOKANA) 300 MG TABS tablet Take 300 mg by mouth daily before breakfast.   Yes Historical Provider, MD  cetirizine (ZYRTEC) 10 MG tablet Take 10 mg by mouth daily as needed for allergies.   Yes Historical Provider, MD  docusate sodium (COLACE) 100 MG capsule Take 100 mg by mouth daily as needed for  mild constipation.   Yes Historical Provider, MD  guaiFENesin (MUCINEX) 600 MG 12 hr tablet Take 600 mg by mouth 2 (two) times daily as needed for cough.   Yes Historical Provider, MD  ibuprofen (ADVIL,MOTRIN) 200 MG tablet Take 200 mg by mouth every 6 (six) hours as needed for moderate pain.   Yes Historical Provider, MD  metoprolol tartrate (LOPRESSOR) 25 mg/10 mL SUSP Take 2.5 mLs (6.25 mg total) by mouth 2 (two) times daily. 11/12/14  Yes Ripudeep Krystal Eaton, MD  SYNTHROID 100 MCG tablet Take 100 mcg by mouth daily. 07/28/14  Yes Historical Provider, MD  tamsulosin (FLOMAX) 0.4 MG CAPS capsule Take 0.4 mg by mouth daily.   Yes Historical Provider, MD    Allergies:  Allergies  Allergen Reactions   No Known Allergies     Social History   Social History   Marital Status: Widowed    Spouse Name: N/A   Number of Children: N/A   Years of Education: N/A   Occupational History   Not on file.   Social History Main Topics   Smoking status: Never Smoker    Smokeless tobacco: Never Used   Alcohol Use: No   Drug Use: No   Sexual Activity: Not on file   Other Topics Concern   Not on file  Social History Narrative   ** Merged History Encounter **          Review of Systems: Constitutional: negative for chills, fever, night sweats or fatigue. Positive for weight changes  HEENT: negative for vision changes, hearing loss, congestion, rhinorrhea, ST, epistaxis, or sinus pressure Cardiovascular: negative for chest pain or palpitations Respiratory: negative for hemoptysis, wheezing, shortness of breath, or cough Abdominal: negative for abdominal pain, nausea, vomiting, diarrhea, or constipation Dermatological: negative for rash Neurologic: negative for headache, or syncope. Positive dizziness All other systems reviewed and are otherwise negative with the exception to those above and in the HPI.   Physical Exam: Blood pressure 122/74, pulse 84, temperature 97.3 F (36.3 C),  temperature source Oral, height 5' 9.5" (1.765 m), weight 159 lb 9.6 oz (72.394 kg), SpO2 98 %., Body mass index is 23.24 kg/(m^2). General: Well developed, well nourished, in no acute distress. Head: Normocephalic, atraumatic, eyes without discharge, sclera non-icteric, nares are without discharge. Bilateral auditory canals clear, TM's are without perforation, pearly grey and translucent with reflective cone of light bilaterally. Oral cavity moist, posterior pharynx without exudate, erythema, peritonsillar abscess, or post nasal drip.  Neck: Supple. No thyromegaly. Full ROM. No lymphadenopathy. Lungs: Clear bilaterally to auscultation without wheezes, rales, or rhonchi. Breathing is unlabored. Heart: RRR with S1 S2. No murmurs, rubs, or gallops appreciated. Abdomen: Soft, non-tender, non-distended with normoactive bowel sounds. No hepatomegaly. No rebound/guarding. No obvious abdominal masses. Msk:  Strength and tone normal for age. Extremities/Skin: Warm and dry. No clubbing or cyanosis. No edema. No rashes or suspicious lesions. Neuro: Alert and oriented X 3. Moves all extremities spontaneously. Gait is normal. CNII-XII grossly in tact. Psych:  Responds to questions appropriately with a normal affect.   Labs: Results for orders placed or performed in visit on 12/27/14  POCT CBC  Result Value Ref Range   WBC 10.0 4.6 - 10.2 K/uL   Lymph, poc 2.3 0.6 - 3.4   POC LYMPH PERCENT 22.7 10 - 50 %L   MID (cbc) 0.7 0 - 0.9   POC MID % 6.9 0 - 12 %M   POC Granulocyte 7.0 (A) 2 - 6.9   Granulocyte percent 70.4 37 - 80 %G   RBC 4.67 (A) 4.69 - 6.13 M/uL   Hemoglobin 14.0 (A) 14.1 - 18.1 g/dL   HCT, POC 44.0 43.5 - 53.7 %   MCV 94.2 80 - 97 fL   MCH, POC 29.9 27 - 31.2 pg   MCHC 31.8 31.8 - 35.4 g/dL   RDW, POC 15.9 %   Platelet Count, POC 273 142 - 424 K/uL   MPV 7.2 0 - 99.8 fL  POCT glycosylated hemoglobin (Hb A1C)  Result Value Ref Range   Hemoglobin A1C 7.5      Results for orders  placed or performed in visit on 12/27/14  POCT CBC  Result Value Ref Range   WBC 10.0 4.6 - 10.2 K/uL   Lymph, poc 2.3 0.6 - 3.4   POC LYMPH PERCENT 22.7 10 - 50 %L   MID (cbc) 0.7 0 - 0.9   POC MID % 6.9 0 - 12 %M   POC Granulocyte 7.0 (A) 2 - 6.9   Granulocyte percent 70.4 37 - 80 %G   RBC 4.67 (A) 4.69 - 6.13 M/uL   Hemoglobin 14.0 (A) 14.1 - 18.1 g/dL   HCT, POC 44.0 43.5 - 53.7 %   MCV 94.2 80 - 97 fL   MCH, POC 29.9 27 -  31.2 pg   MCHC 31.8 31.8 - 35.4 g/dL   RDW, POC 15.9 %   Platelet Count, POC 273 142 - 424 K/uL   MPV 7.2 0 - 99.8 fL  POCT glycosylated hemoglobin (Hb A1C)  Result Value Ref Range   Hemoglobin A1C 7.5    [facility] reading (PRIMARY) by  Dr.Lauenstein. Blunted CP angle and Cardiomegaly   EKG reading- Afib. Incomplete bundle branch block  ASSESSMENT AND PLAN:  81 y.o. year old male with  This chart was scribed in my presence and reviewed by me personally.    ICD-9-CM ICD-10-CM   1. Unstable gait 781.2 R26.81 EKG 12-Lead     POCT CBC     COMPLETE METABOLIC PANEL WITH GFR     POCT glycosylated hemoglobin (Hb A1C)     Vitamin B12     TSH     Ambulatory referral to Home Health  2. Diabetes mellitus type 2, uncomplicated 941.74 Y81.4 ramipril (ALTACE) 5 MG capsule  3. Persistent atrial fibrillation 427.31 I48.1 EKG 12-Lead     apixaban (ELIQUIS) 5 MG TABS tablet  4. Loss of weight 783.21 R63.4 DG Chest 2 View     DG Chest 2 View  5. Other specified hypothyroidism 244.8 E03.8 SYNTHROID 100 MCG tablet  6. Generalized anxiety disorder 300.02 F41.1 citalopram (CELEXA) 20 MG tablet     Signed, Robyn Haber, MD 12/27/2014 10:59 AM

## 2014-12-28 ENCOUNTER — Other Ambulatory Visit: Payer: Self-pay | Admitting: Family Medicine

## 2014-12-28 MED ORDER — LEVOTHYROXINE SODIUM 125 MCG PO TABS: 125.0000 ug | ORAL_TABLET | Freq: Every day | ORAL | Status: DC

## 2014-12-30 ENCOUNTER — Ambulatory Visit (INDEPENDENT_AMBULATORY_CARE_PROVIDER_SITE_OTHER): Payer: Medicare Other | Admitting: Family Medicine

## 2014-12-30 VITALS — BP 90/60 | HR 93 | Temp 97.7°F | Resp 18 | Ht 69.5 in | Wt 160.0 lb

## 2014-12-30 DIAGNOSIS — I481 Persistent atrial fibrillation: Secondary | ICD-10-CM | POA: Diagnosis not present

## 2014-12-30 DIAGNOSIS — I9589 Other hypotension: Secondary | ICD-10-CM | POA: Diagnosis not present

## 2014-12-30 DIAGNOSIS — I4819 Other persistent atrial fibrillation: Secondary | ICD-10-CM

## 2014-12-30 DIAGNOSIS — R2681 Unsteadiness on feet: Secondary | ICD-10-CM

## 2014-12-30 DIAGNOSIS — M79641 Pain in right hand: Secondary | ICD-10-CM

## 2014-12-30 NOTE — Patient Instructions (Signed)
The only medicines that you should take our L at [facility] once a day and Synthroid once a day. I am setting you up for a cardiology referral.

## 2014-12-30 NOTE — Progress Notes (Signed)
Subjective:  This chart was scribed for Robyn Haber MD, by Tamsen Roers, at [facility].  This patient was seen in room 6 and the patient's care was started at 11:06 AM.   Chief Complaint  Patient presents with   Fall    This AM/ tissue damage on rt hand   Dizziness     Patient ID: Joe [name], male    DOB: 14-Jan-1925, 81 y.o.   MRN: [id number]  HPI  HPI Comments: Joe [name] is a 81 y.o. male who presents to the [facility] complaining of a laceration on his right hand after falling this morning when he felt dizzy. Patient denies hitting his head after the fall or any loss of consciousness. Patient has been falling recently and feels an onset of dizziness when he stands up.  Per his sister, he has been getting dizzy more often and has not seen a cardiologist recently.   Patient was seen in emergency room in July and then him admitted. He has remained unsteady on his feet ever since. He is brought here by his wife.   Patient Active Problem List   Diagnosis Date Noted   Adjustment disorder with mixed anxiety and depressed mood 11/13/2014   Cognitive deficits 11/13/2014   Syncope and collapse 11/10/2014   Orthostatic hypotension 11/10/2014   Hypothyroidism 08/28/2014   Essential hypertension 08/28/2014   Atrial fibrillation, new onset 08/28/2014   Dizziness 08/28/2014   BPH (benign prostatic hypertrophy) 08/28/2014   Diabetes mellitus with no complication 62/95/2841   Risky sexual behavior 08/28/2014   A-fib 08/28/2014   Hypothyroid 09/10/2013   BPH (benign prostatic hyperplasia) 09/10/2013   Past Medical History  Diagnosis Date   Thyroid disease    Anxiety    Hypertension     /notes 08/28/2014   BPH (benign prostatic hypertrophy)     [name] 08/28/2014   Atrial fibrillation with RVR 08/28/2014    [name] 08/28/2014   Hypothyroidism     /notes 08/28/2014   Past Surgical History  Procedure  Laterality Date   Pars plana vitrectomy Left 03/30/2013    Procedure: PARS PLANA VITRECTOMY 25 GAUGE FOR ENDOPHTHALMITIS;  Surgeon: Hurman Horn, MD;  Location: [place];  Service: Ophthalmology;  Laterality: Left;  LOCAL RETROBULBAR,,  USE MARCAINE 0.5%   Allergies  Allergen Reactions   No Known Allergies    Prior to Admission medications   Medication Sig Start Date End Date Taking? Authorizing Provider  apixaban (ELIQUIS) 5 MG TABS tablet Take 1 tablet (5 mg total) by mouth 2 (two) times daily. 12/27/14  Yes Robyn Haber, MD  ibuprofen (ADVIL,MOTRIN) 200 MG tablet Take 200 mg by mouth every 6 (six) hours as needed for moderate pain.   Yes Historical Provider, MD  levothyroxine (SYNTHROID, LEVOTHROID) 125 MCG tablet Take 1 tablet (125 mcg total) by mouth daily before breakfast. 12/28/14  Yes Robyn Haber, MD  ramipril (ALTACE) 5 MG capsule Take 1 capsule (5 mg total) by mouth daily. 12/27/14  Yes Robyn Haber, MD  cetirizine (ZYRTEC) 10 MG tablet Take 10 mg by mouth daily as needed for allergies.    Historical Provider, MD  citalopram (CELEXA) 20 MG tablet Take 1 tablet (20 mg total) by mouth daily. Patient not taking: Reported on 12/30/2014 12/27/14   Robyn Haber, MD  docusate sodium (COLACE) 100 MG capsule Take 100 mg by mouth daily as needed for mild constipation.    Historical Provider, MD  Social History   Social History   Marital Status: Widowed    Spouse Name: N/A   Number of Children: N/A   Years of Education: N/A   Occupational History   Not on file.   Social History Main Topics   Smoking status: Never Smoker    Smokeless tobacco: Never Used   Alcohol Use: No   Drug Use: No   Sexual Activity: Not on file   Other Topics Concern   Not on file   Social History Narrative   ** Merged History Encounter **             Review of Systems  Constitutional: Negative for fever and chills.  Respiratory: Negative for cough and choking.     Gastrointestinal: Negative for nausea and vomiting.  Musculoskeletal: Positive for gait problem.  Skin: Positive for wound.  Neurological: Positive for dizziness.       Objective:   Physical Exam  HENT:  Head: Normocephalic and atraumatic.  Eyes: Pupils are equal, round, and reactive to light.  Pulmonary/Chest: Effort normal.  Neurological: He is alert.  Skin: Skin is warm and dry.  Nursing note and vitals reviewed.  Filed Vitals:   12/30/14 1058  BP: 90/60  Pulse: 93  Temp: 97.7 F (36.5 C)  TempSrc: Oral  Resp: 18  Height: 5' 9.5" (1.765 m)  Weight: 160 lb (72.576 kg)  SpO2: 96%   Patient is in no acute distress although he is somewhat tremulous Head is atraumatic Extraocular motion is conjugate Oropharynx: No trauma Neck: Supple, good range of motion Chest: Few basilar rales Heart: Irregularly irregular with a rapid rhythm Abdomen: Soft and nontender Neuro: Patient moving 4 extremities equally but he is tremulous and unsteady with a positive Romberg Right hand shows a flap laceration on the hyperthenar aspect with active bleeding.  EKG: Atrial fibrillation with a rate of 90, no acute changes, incomplete right bundle branch    Assessment & Plan:   This chart was scribed in my presence and reviewed by me personally.    ICD-9-CM ICD-10-CM   1. Unstable gait 781.2 R26.81 EKG 12-Lead  2. Hand pain, right 729.5 M79.641      Signed, Robyn Haber, MD

## 2015-01-02 ENCOUNTER — Ambulatory Visit: Payer: Self-pay | Admitting: Physician Assistant

## 2015-01-12 ENCOUNTER — Encounter (INDEPENDENT_AMBULATORY_CARE_PROVIDER_SITE_OTHER): Payer: Medicare Other | Admitting: Ophthalmology

## 2015-02-11 ENCOUNTER — Encounter (INDEPENDENT_AMBULATORY_CARE_PROVIDER_SITE_OTHER): Payer: Medicare Other | Admitting: Ophthalmology

## 2015-02-11 DIAGNOSIS — H353134 Nonexudative age-related macular degeneration, bilateral, advanced atrophic with subfoveal involvement: Secondary | ICD-10-CM | POA: Diagnosis not present

## 2015-02-11 DIAGNOSIS — H43813 Vitreous degeneration, bilateral: Secondary | ICD-10-CM | POA: Diagnosis not present

## 2015-04-29 DIAGNOSIS — N400 Enlarged prostate without lower urinary tract symptoms: Secondary | ICD-10-CM | POA: Diagnosis not present

## 2015-07-09 DIAGNOSIS — N400 Enlarged prostate without lower urinary tract symptoms: Secondary | ICD-10-CM | POA: Diagnosis not present

## 2015-07-09 DIAGNOSIS — N390 Urinary tract infection, site not specified: Secondary | ICD-10-CM | POA: Diagnosis not present

## 2015-07-09 DIAGNOSIS — E032 Hypothyroidism due to medicaments and other exogenous substances: Secondary | ICD-10-CM | POA: Diagnosis not present

## 2015-07-09 DIAGNOSIS — E118 Type 2 diabetes mellitus with unspecified complications: Secondary | ICD-10-CM | POA: Diagnosis not present

## 2015-07-30 DIAGNOSIS — E118 Type 2 diabetes mellitus with unspecified complications: Secondary | ICD-10-CM | POA: Diagnosis not present

## 2015-07-30 DIAGNOSIS — R42 Dizziness and giddiness: Secondary | ICD-10-CM | POA: Diagnosis not present

## 2015-07-30 DIAGNOSIS — E032 Hypothyroidism due to medicaments and other exogenous substances: Secondary | ICD-10-CM | POA: Diagnosis not present

## 2015-09-03 DIAGNOSIS — E118 Type 2 diabetes mellitus with unspecified complications: Secondary | ICD-10-CM | POA: Diagnosis not present

## 2015-09-03 DIAGNOSIS — E032 Hypothyroidism due to medicaments and other exogenous substances: Secondary | ICD-10-CM | POA: Diagnosis not present

## 2015-09-10 DIAGNOSIS — E032 Hypothyroidism due to medicaments and other exogenous substances: Secondary | ICD-10-CM | POA: Diagnosis not present

## 2015-09-10 DIAGNOSIS — E118 Type 2 diabetes mellitus with unspecified complications: Secondary | ICD-10-CM | POA: Diagnosis not present

## 2015-09-10 DIAGNOSIS — R634 Abnormal weight loss: Secondary | ICD-10-CM | POA: Diagnosis not present

## 2015-10-11 ENCOUNTER — Emergency Department (HOSPITAL_COMMUNITY)
Admission: EM | Admit: 2015-10-11 | Discharge: 2015-10-11 | Disposition: A | Discharge status: Home / Self Care | Payer: Medicare Other | Attending: Emergency Medicine | Admitting: Emergency Medicine

## 2015-10-11 ENCOUNTER — Emergency Department (HOSPITAL_COMMUNITY): Payer: Medicare Other

## 2015-10-11 ENCOUNTER — Encounter (HOSPITAL_COMMUNITY): Payer: Self-pay | Admitting: Emergency Medicine

## 2015-10-11 DIAGNOSIS — H5702 Anisocoria: Secondary | ICD-10-CM | POA: Diagnosis not present

## 2015-10-11 DIAGNOSIS — Y999 Unspecified external cause status: Secondary | ICD-10-CM | POA: Insufficient documentation

## 2015-10-11 DIAGNOSIS — Y92810 Car as the place of occurrence of the external cause: Secondary | ICD-10-CM | POA: Insufficient documentation

## 2015-10-11 DIAGNOSIS — S022XXB Fracture of nasal bones, initial encounter for open fracture: Secondary | ICD-10-CM | POA: Insufficient documentation

## 2015-10-11 DIAGNOSIS — S0512XA Contusion of eyeball and orbital tissues, left eye, initial encounter: Secondary | ICD-10-CM | POA: Insufficient documentation

## 2015-10-11 DIAGNOSIS — S0992XA Unspecified injury of nose, initial encounter: Secondary | ICD-10-CM | POA: Diagnosis present

## 2015-10-11 DIAGNOSIS — H05232 Hemorrhage of left orbit: Secondary | ICD-10-CM

## 2015-10-11 DIAGNOSIS — Y939 Activity, unspecified: Secondary | ICD-10-CM | POA: Insufficient documentation

## 2015-10-11 MED ORDER — AMOXICILLIN 500 MG PO CAPS: 500.0000 mg | ORAL_CAPSULE | Freq: Three times a day (TID) | ORAL | Status: DC

## 2015-10-11 MED ORDER — IBUPROFEN 600 MG PO TABS: 600.0000 mg | ORAL_TABLET | Freq: Four times a day (QID) | ORAL | Status: DC | PRN

## 2015-10-11 MED ORDER — TETANUS-DIPHTH-ACELL PERTUSSIS 5-2.5-18.5 LF-MCG/0.5 IM SUSP
0.5000 mL | Freq: Once | INTRAMUSCULAR | Status: AC
  Administered 2015-10-11: 0.5 mL via INTRAMUSCULAR
  Filled 2015-10-11: qty 0.5

## 2015-10-11 NOTE — Discharge Instructions (Signed)
Take ibuprofen or Tylenol for pain. Apply ice pack to the face several times a day. Take amoxicillin as prescribed until all gone to prevent infection. Please follow-up with Ear Nose throat specialist as referred, also with ophthalmology regarding your eye findings on exam today. Return if worsening symptoms   Nasal Fracture A nasal fracture is a break or crack in the bones or cartilage of the nose. Minor breaks do not require treatment. These breaks usually heal on their own after about one month. Serious breaks may require surgery. CAUSES This injury is usually caused by a blunt injury to the nose. This type of injury often occurs from:  Contact sports.  Car accidents.  Falls.  Getting punched. SYMPTOMS Symptoms of this injury include:  Pain.  Swelling of the nose.  Bleeding from the nose.  Bruising around the nose or eyes. This may include having black eyes.  Crooked appearance of the nose. DIAGNOSIS This injury may be diagnosed with a physical exam. The health care provider will gently feel the nose for signs of broken bones. He or she will look inside the nostrils to make sure that there is not a blood-filled swelling on the dividing wall between the nostrils (septal hematoma). X-rays of the nose may not show a nasal fracture even when one is present. In some cases, X-rays or a CT scan may be done 1-5 days after the injury. Sometimes, the health care provider will want to wait until the swelling has gone down. TREATMENT Often, minor fractures that have caused no deformity do not require treatment. More serious fractures in which bones have moved out of position may require surgery, which will take place after the swelling is gone. Surgery will stabilize and align the fracture. In some cases, a health care provider may be able to reposition the bones without surgery. This may be done in the health care provider's office after medicine is given to numb the area (local  anesthetic). HOME CARE INSTRUCTIONS  If directed, apply ice to the injured area:  Put ice in a plastic bag.  Place a towel between your skin and the bag.  Leave the ice on for 20 minutes, 2-3 times per day.  Take over-the-counter and prescription medicines only as told by your health care provider.  If your nose starts to bleed, sit in an upright position while you squeeze the soft parts of your nose against the dividing wall between your nostrils (septum) for 10 minutes.  Try to avoid blowing your nose.  Return to your normal activities as told by your health care provider. Ask your health care provider what activities are safe for you.  Avoid contact sports for 3-4 weeks or as told by your health care provider.  Keep all follow-up visits as told by your health care provider. This is important. SEEK MEDICAL CARE IF:  Your pain increases or becomes severe.  You continue to have nosebleeds.  The shape of your nose does not return to normal within 5 days.  You have pus draining out of your nose. SEEK IMMEDIATE MEDICAL CARE IF:  You have bleeding from your nose that does not stop after you pinch your nostrils closed for 20 minutes and keep ice on your nose.  You have clear fluid draining out of your nose.  You notice a grape-like swelling on the septum. This swelling is a collection of blood (hematoma) that must be drained to help prevent infection.  You have difficulty moving your eyes.  You have  repeated vomiting.   This information is not intended to replace advice given to you by your health care provider. Make sure you discuss any questions you have with your health care provider.   Document Released: 04/15/2000 Document Revised: 01/07/2015 Document Reviewed: 05/26/2014 Elsevier Interactive Patient Education [employer].

## 2015-10-11 NOTE — ED Notes (Signed)
Pt did not return to get discharge instructions-- the phone # on file does not work
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
## 2015-10-11 NOTE — ED Notes (Signed)
Pt came to [place] desk requesting that he go home now. Told pt that we would get in contact w/ [employer] officer [place] to take him to his car. Off duty [employer] officer [place] working on Therapist, art [place] aware that pt is ready to go. Pt is currently in waiting room awaiting officers arrival.

## 2015-10-11 NOTE — ED Notes (Signed)
Pt not in room or waiting room when this nurse went to discharge pt -- pt was not in room and not in waiting room. Discharge instructions given to triage nurse if patient returns.
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
## 2015-10-11 NOTE — ED Notes (Signed)
Pt returns from radiology. [employer] at bedside.

## 2015-12-09 ENCOUNTER — Telehealth: Payer: Self-pay

## 2015-12-09 NOTE — Telephone Encounter (Signed)
Msg is Joe [name], his sister wanted Dr. Carlota [name] personally to contact her. He is really sick and could not get an appointment to see him for tomorrow. I told pt that he can see another doctor and she doesn't want him to see someone else.  Please advise  [phone]

## 2015-12-10 ENCOUNTER — Telehealth: Payer: Self-pay | Admitting: Emergency Medicine

## 2015-12-10 NOTE — Telephone Encounter (Signed)
Spoke with patient sister Verdis Frederickson to arrange scheduled appointment with Dr. Carlota Raspberry on Saturday.

## 2015-12-14 ENCOUNTER — Ambulatory Visit (INDEPENDENT_AMBULATORY_CARE_PROVIDER_SITE_OTHER): Payer: Medicare Other | Admitting: Family Medicine

## 2015-12-14 ENCOUNTER — Encounter: Payer: Self-pay | Admitting: Family Medicine

## 2015-12-14 VITALS — BP 112/68 | HR 114 | Temp 98.9°F | Resp 18 | Ht 69.5 in

## 2015-12-14 DIAGNOSIS — I4891 Unspecified atrial fibrillation: Secondary | ICD-10-CM

## 2015-12-14 DIAGNOSIS — R42 Dizziness and giddiness: Secondary | ICD-10-CM

## 2015-12-14 DIAGNOSIS — R630 Anorexia: Secondary | ICD-10-CM

## 2015-12-14 DIAGNOSIS — Z5320 Procedure and treatment not carried out because of patient's decision for unspecified reasons: Secondary | ICD-10-CM | POA: Diagnosis not present

## 2015-12-14 DIAGNOSIS — R531 Weakness: Secondary | ICD-10-CM | POA: Diagnosis not present

## 2015-12-14 DIAGNOSIS — R5383 Other fatigue: Secondary | ICD-10-CM | POA: Diagnosis not present

## 2015-12-14 DIAGNOSIS — Z5329 Procedure and treatment not carried out because of patient's decision for other reasons: Secondary | ICD-10-CM
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
## 2015-12-14 NOTE — Patient Instructions (Addendum)
As we discussed I am concerned your symptoms are due to uncontrolled atrial fibrillation, and possible stroke off the blood thinners. Further evaluation in emergency room needed and planned on paramedic transport which you have refused. Risks include worsening symptoms, further falls and injury or death. Please be seen in emergency room or call 911.   IF you received an x-ray today, you will receive an invoice from [facility]. Please contact [facility] at [phone] with questions or concerns regarding your invoice.   IF you received labwork today, you will receive an invoice from [employer]. Please contact [facility] at [phone] with questions or concerns regarding your invoice.   Our billing staff will not be able to assist you with questions regarding bills from these companies.  You will be contacted with the lab results as soon as they are available. The fastest way to get your results is to activate your My Chart account. Instructions are located on the last page of this paperwork. If you have not heard from [place] regarding the results in 2 weeks, please contact this office.

## 2015-12-21 ENCOUNTER — Emergency Department (HOSPITAL_COMMUNITY): Payer: Medicare Other

## 2015-12-21 ENCOUNTER — Encounter (HOSPITAL_COMMUNITY): Payer: Self-pay

## 2015-12-21 ENCOUNTER — Emergency Department (HOSPITAL_COMMUNITY)
Admission: EM | Admit: 2015-12-21 | Discharge: 2015-12-22 | Disposition: A | Discharge status: Home / Self Care | Payer: Medicare Other | Attending: Physician Assistant | Admitting: Physician Assistant

## 2015-12-21 DIAGNOSIS — Z9114 Patient's other noncompliance with medication regimen: Secondary | ICD-10-CM | POA: Insufficient documentation

## 2015-12-21 DIAGNOSIS — F329 Major depressive disorder, single episode, unspecified: Secondary | ICD-10-CM | POA: Diagnosis not present

## 2015-12-21 DIAGNOSIS — F4323 Adjustment disorder with mixed anxiety and depressed mood: Secondary | ICD-10-CM | POA: Diagnosis not present

## 2015-12-21 DIAGNOSIS — Z791 Long term (current) use of non-steroidal anti-inflammatories (NSAID): Secondary | ICD-10-CM | POA: Diagnosis not present

## 2015-12-21 DIAGNOSIS — E119 Type 2 diabetes mellitus without complications: Secondary | ICD-10-CM | POA: Insufficient documentation

## 2015-12-21 DIAGNOSIS — R9082 White matter disease, unspecified: Secondary | ICD-10-CM | POA: Diagnosis not present

## 2015-12-21 DIAGNOSIS — Z79899 Other long term (current) drug therapy: Secondary | ICD-10-CM | POA: Diagnosis not present

## 2015-12-21 DIAGNOSIS — Z0000 Encounter for general adult medical examination without abnormal findings: Secondary | ICD-10-CM | POA: Diagnosis present

## 2015-12-21 DIAGNOSIS — E039 Hypothyroidism, unspecified: Secondary | ICD-10-CM | POA: Diagnosis not present

## 2015-12-21 DIAGNOSIS — I10 Essential (primary) hypertension: Secondary | ICD-10-CM | POA: Insufficient documentation

## 2015-12-21 DIAGNOSIS — F32A Depression, unspecified: Secondary | ICD-10-CM

## 2015-12-21 LAB — COMPREHENSIVE METABOLIC PANEL
ALBUMIN: 3 g/dL — AB (ref 3.5–5.0)
ALK PHOS: 95 U/L (ref 38–126)
ALT: 13 U/L — ABNORMAL LOW (ref 17–63)
ANION GAP: 9 (ref 5–15)
AST: 17 U/L (ref 15–41)
BILIRUBIN TOTAL: 0.4 mg/dL (ref 0.3–1.2)
BUN: 13 mg/dL (ref 6–20)
CO2: 27 mmol/L (ref 22–32)
Calcium: 9 mg/dL (ref 8.9–10.3)
Chloride: 97 mmol/L — ABNORMAL LOW (ref 101–111)
Creatinine, Ser: 0.98 mg/dL (ref 0.61–1.24)
GFR calc Af Amer: >60 mL/min (ref >60)
GFR calc non Af Amer: >60 mL/min (ref >60)
GLUCOSE: 302 mg/dL — AB (ref 65–99)
Potassium: 4.2 mmol/L (ref 3.5–5.1)
SODIUM: 133 mmol/L — AB (ref 135–145)
TOTAL PROTEIN: 7.1 g/dL (ref 6.5–8.1)

## 2015-12-21 LAB — TROPONIN I: Troponin I: <0.03 ng/mL (ref <0.03)

## 2015-12-21 LAB — CBC
HCT: 40.1 % (ref 39.0–52.0)
HEMOGLOBIN: 14.4 g/dL (ref 13.0–17.0)
MCH: 31.6 pg (ref 26.0–34.0)
MCHC: 35.9 g/dL (ref 30.0–36.0)
MCV: 87.9 fL (ref 78.0–100.0)
PLATELETS: 381 10*3/uL (ref 150–400)
RBC: 4.56 MIL/uL (ref 4.22–5.81)
RDW: 13.9 % (ref 11.5–15.5)
WBC: 14.5 10*3/uL — ABNORMAL HIGH (ref 4.0–10.5)

## 2015-12-21 LAB — URINALYSIS, ROUTINE W REFLEX MICROSCOPIC
GLUCOSE, UA: 500 mg/dL — AB
Hgb urine dipstick: NEGATIVE
Ketones, ur: 15 mg/dL — AB
Leukocytes, UA: NEGATIVE
Nitrite: NEGATIVE
Protein, ur: NEGATIVE mg/dL
SPECIFIC GRAVITY, URINE: 1.021 (ref 1.005–1.030)
pH: 5.5 (ref 5.0–8.0)

## 2015-12-21 LAB — ETHANOL: Alcohol, Ethyl (B): <5 mg/dL (ref <5)

## 2015-12-21 MED ORDER — APIXABAN 5 MG PO TABS
5.0000 mg | ORAL_TABLET | Freq: Two times a day (BID) | ORAL | Status: DC
  Administered 2015-12-22 (×2): 5 mg via ORAL
  Filled 2015-12-21 (×2): qty 1

## 2015-12-21 MED ORDER — RAMIPRIL 5 MG PO CAPS
5.0000 mg | ORAL_CAPSULE | Freq: Every day | ORAL | Status: DC
  Administered 2015-12-22: 5 mg via ORAL
  Filled 2015-12-21: qty 1

## 2015-12-21 MED ORDER — LEVOTHYROXINE SODIUM 125 MCG PO TABS
125.0000 ug | ORAL_TABLET | Freq: Every day | ORAL | Status: DC
  Administered 2015-12-22: 125 ug via ORAL
  Filled 2015-12-21 (×2): qty 1

## 2015-12-21 MED ORDER — CITALOPRAM HYDROBROMIDE 10 MG PO TABS
20.0000 mg | ORAL_TABLET | Freq: Every day | ORAL | Status: DC
  Administered 2015-12-22: 20 mg via ORAL
  Filled 2015-12-21: qty 2

## 2015-12-21 NOTE — ED Notes (Signed)
Pt's belonging is in a red biohazard bag, writer was notified by [employer] that pt has bed bugs.
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
## 2015-12-21 NOTE — ED Provider Notes (Signed)
[facility] DEPT Provider Note   CSN: [id number] Arrival date & time: 12/21/15  1813  By signing my name below, I, Joe [name], attest that this documentation has been prepared under the direction and in the presence of [employer], PA-C.  Electronically Signed: Reola [name], ED Scribe. 12/21/15. 8:41 PM.  History   Chief Complaint Chief Complaint  Patient presents with  . Medical Clearance   The history is provided by the patient and the police. No language interpreter was used.   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 Per his paperwork, his family put him under IVC because they are concerned that the pt is unable to take care of himself. At bedside, the patient states that he lives with a multiple family members. Pt is repeatedly stating that his family has nothing to do with him, and that he is fully functional in his daily life. He denies any pain, and there are no other additional complaints.   Prior chart review shows that pt was seen on 12/14/15 at [facility] c/o of a new onset of falls, weakness, dizziness, and unsteady gait. His chart shows that at that time the patient admitted to not taking his Eliquis for ~2-3 months, and UC recommended him to come into the ER for further evaluation. However, the pt refused further care at that point and left AMA.   Past Medical History:  Diagnosis Date  . Anxiety   . Atrial fibrillation with RVR ([place]) 08/28/2014   [name] 08/28/2014  . BPH (benign prostatic hypertrophy)    [name] 08/28/2014  . Hypertension    [name] 08/28/2014  . Hypothyroidism    [name] 08/28/2014  . Thyroid disease    Patient Active Problem List   Diagnosis Date Noted  . Adjustment disorder with mixed anxiety and depressed mood 11/13/2014  . Cognitive  deficits 11/13/2014  . Syncope and collapse 11/10/2014  . Orthostatic hypotension 11/10/2014  . Hypothyroidism 08/28/2014  . Essential hypertension 08/28/2014  . Atrial fibrillation, new onset ([place]) 08/28/2014  . Dizziness 08/28/2014  . BPH (benign prostatic hypertrophy) 08/28/2014  . Diabetes mellitus with no complication ([place]) [id number]  . Risky sexual behavior 08/28/2014  . A-fib ([place]) 08/28/2014  . Hypothyroid 09/10/2013  . BPH (benign prostatic hyperplasia) 09/10/2013   Past Surgical History:  Procedure Laterality Date  . PARS PLANA VITRECTOMY Left 03/30/2013   Procedure: PARS PLANA VITRECTOMY 25 GAUGE FOR ENDOPHTHALMITIS;  Surgeon: Hurman Horn, MD;  Location: [place];  Service: Ophthalmology;  Laterality: Left;  LOCAL RETROBULBAR,,  USE MARCAINE 0.5%    Home Medications    Prior to Admission medications   Medication Sig Start Date End Date Taking? Authorizing Provider  apixaban (ELIQUIS) 5 MG TABS tablet Take 1 tablet (5 mg total) by mouth 2 (two) times daily. Patient not taking: Reported on 12/14/2015 12/27/14   Robyn Haber, MD  citalopram (CELEXA) 20 MG tablet Take 1 tablet (20 mg total) by mouth daily. Patient not taking: Reported on 12/30/2014 12/27/14   Robyn Haber, MD  ibuprofen (ADVIL,MOTRIN) 600 MG tablet Take 1 tablet (600 mg total) by mouth every 6 (six) hours as needed. Patient not taking: Reported on 12/14/2015 10/11/15   Jeannett Senior, PA-C  levothyroxine (SYNTHROID, LEVOTHROID) 125 MCG tablet Take 1 tablet (125 mcg total) by mouth daily before  breakfast. Patient not taking: Reported on 12/14/2015 12/28/14   Robyn Haber, MD  ramipril (ALTACE) 5 MG capsule Take 1 capsule (5 mg total) by mouth daily. Patient not taking: Reported on 12/14/2015 12/27/14   Robyn Haber, MD   Family History Family History  Problem Relation Age of Onset  . Hyperlipidemia Sister    Social History Social History  Substance Use Topics  . Smoking status: Never Smoker  .  Smokeless tobacco: Never Used  . Alcohol use No   Allergies   No known allergies  Review of Systems Review of Systems  Constitutional: Negative for fever.  Cardiovascular: Negative for chest pain.  Gastrointestinal: Negative for abdominal pain.  Musculoskeletal: Negative for arthralgias, back pain, myalgias and neck pain.   A complete 10 system review of systems was obtained and all systems are negative except as noted in the HPI and PMH.   Physical Exam Updated Vital Signs BP 110/67 (BP Location: Left Arm)   Pulse 92   Temp 98.6 F (37 C)   Resp 18   SpO2 98%   Physical Exam  Constitutional: He is oriented to person, place, and time. He appears well-developed and well-nourished. No distress.  Thin, frail appearing. Nontoxic. Slightly disheveled.   HENT:  Head: Normocephalic and atraumatic.  Eyes: Conjunctivae and EOM are normal. No scleral icterus.  Neck: Normal range of motion.  Cardiovascular: Normal rate.  An irregularly irregular rhythm present.  Pulmonary/Chest: Effort normal. No respiratory distress.  Musculoskeletal: Normal range of motion.  Neurological: He is alert and oriented to person, place, and time.  A&Ox3. Speech is clear, goal oriented. Answering questions appropriately. Moving all extremities.  Skin: Skin is warm and dry. No rash noted. He is not diaphoretic. No erythema. No pallor.  Psychiatric: His speech is normal. His affect is angry. He is agitated. He is not aggressive. He expresses no homicidal and no suicidal ideation.  Nursing note and vitals reviewed.   ED Treatments / Results  DIAGNOSTIC STUDIES: Oxygen Saturation is 95% on RA, adequate by my interpretation.   COORDINATION OF CARE: 8:41 PM-Discussed next steps with pt. Pt verbalized understanding and is agreeable with the plan.   Labs (all labs ordered are listed, but only abnormal results are displayed) Labs Reviewed  COMPREHENSIVE METABOLIC PANEL - Abnormal; Notable for the  following:       Result Value   Sodium 133 (*)    Chloride 97 (*)    Glucose, Bld 302 (*)    Albumin 3.0 (*)    ALT 13 (*)    All other components within normal limits  CBC - Abnormal; Notable for the following:    WBC 14.5 (*)    All other components within normal limits  URINALYSIS, ROUTINE W REFLEX MICROSCOPIC (NOT AT [facility]) - Abnormal; Notable for the following:    Glucose, UA 500 (*)    Bilirubin Urine SMALL (*)    Ketones, ur 15 (*)    All other components within normal limits  TSH - Abnormal; Notable for the following:    TSH 15.274 (*)    All other components within normal limits  ETHANOL  URINE RAPID DRUG SCREEN, HOSP PERFORMED  TROPONIN I  T4, FREE   EKG  EKG Interpretation  Date/Time:  Monday December 21 2015 21:19:13 EDT Ventricular Rate:  112 PR Interval:    QRS Duration: 116 QT Interval:  358 QTC Calculation: 474 R Axis:   90 Text Interpretation:  Atrial fibrillation Right bundle branch block  No significant change was found Confirmed by Florina Ou  MD, Jenny Reichmann ([id number]) on 12/21/2015 9:21:44 PM      Radiology Ct Head Wo Contrast  Result Date: 12/21/2015 CLINICAL DATA:  Found down. EXAM: CT HEAD WITHOUT CONTRAST TECHNIQUE: Contiguous axial images were obtained from the base of the skull through the vertex without intravenous contrast. COMPARISON:  Head CT dated 11/10/2014. FINDINGS: Brain: Again noted is generalized age related parenchymal atrophy with commensurate dilatation of the ventricles and sulci. Chronic small vessel ischemic changes again noted within the bilateral periventricular and subcortical white matter regions. There is no mass, hemorrhage, edema or other evidence of acute parenchymal abnormality. No extra-axial hemorrhage. Vascular: There are chronic calcified atherosclerotic changes of the large vessels at the skull base. Skull: No osseous fracture or dislocation seen. Sinuses/Orbits: Visualized upper paranasal sinuses are clear. Mastoid air cells are clear.  Visualized upper periorbital and retro-orbital soft tissues are unremarkable. Other: None IMPRESSION: 1. No acute findings.  No intracranial mass, hemorrhage or edema. 2. Atrophy and chronic ischemic changes in the white matter. Electronically Signed   By: Franki Cabot M.D.   On: 12/21/2015 21:22    Procedures Procedures (including critical care time)  Medications Ordered in ED Medications  apixaban (ELIQUIS) tablet 5 mg (5 mg Oral Given 12/22/15 0110)  citalopram (CELEXA) tablet 20 mg (not administered)  levothyroxine (SYNTHROID, LEVOTHROID) tablet 125 mcg (not administered)  ramipril (ALTACE) capsule 5 mg (not administered)    Initial Impression / Assessment and Plan / ED Course  I have reviewed the triage vital signs and the nursing notes.  Pertinent labs & imaging results that were available during my care of the patient were reviewed by me and considered in my medical decision making (see chart for details).  Clinical Course    82 year old male presents to the emergency department under IVC taken out by family over concern for the patient's ability to care for himself. IVC papers state that the patient lives alone. He stopped taking his medications recently. Daily medications ordered to be given while in the emergency department. Patient with no focal deficits on exam. He is agitated, stating that he wants to go home. He reports, "I don't want to be in no damn hospital".  Patient with no evidence of acute infarct or hemorrhage on CT scan. TSH suggestive of uncontrolled hypothyroid. No concern for myxedema coma. Patient with negative troponin. No evidence of UTI. Patient agitated, but does not appear psychotic. I believe he is able to be medically cleared at this time.  Patient evaluated by TTS who recommend AM psychiatric evaluation. Disposition to be determined by oncoming ED provider.   Final Clinical Impressions(s) / ED Diagnoses   Final diagnoses:  Noncompliance with  medication regimen  Depression    Vitals:   12/21/15 1815 12/22/15 0428  BP: 125/75 110/67  Pulse: 117 92  Resp: 18 18  Temp: 98.6 F (37 C)   SpO2: 95% 98%    New Prescriptions New Prescriptions   No medications on file    I personally performed the services described in this documentation, which was scribed in my presence. The recorded information has been reviewed and is accurate.       Antonietta Breach, PA-C 12/22/15 0630    Courteney Julio Alm, MD 12/22/15 [phone]

## 2015-12-22 DIAGNOSIS — F4323 Adjustment disorder with mixed anxiety and depressed mood: Secondary | ICD-10-CM

## 2015-12-22 LAB — RAPID URINE DRUG SCREEN, HOSP PERFORMED
Amphetamines: NOT DETECTED
BARBITURATES: NOT DETECTED
BENZODIAZEPINES: NOT DETECTED
Cocaine: NOT DETECTED
Opiates: NOT DETECTED
Tetrahydrocannabinol: NOT DETECTED

## 2015-12-22 LAB — T4, FREE: Free T4: 0.77 ng/dL (ref 0.61–1.12)

## 2015-12-22 LAB — TSH: TSH: 15.274 u[IU]/mL — AB (ref 0.350–4.500)
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
## 2015-12-22 NOTE — Progress Notes (Signed)
CSW spoke with patient at bedside with no family present. Patient has difficulty hearing. Patient reports he fell down in the yard and stumped his toe. Patient reports he has only fallen one time and he does not fall often. Patient reports he does not know what information his sister provided. Patient reports he lives with his sister, brother in law, and nephew. Patient reports he does not belong here in the ED and he is ready to go. Patient was very demanding and rude wanting to speak with the Psychiatrist at that very moment. CSW informed him that the MD was making rounds seeing patients, however, he continued to demand. Security came and attempted to speak with patient. No questions noted at this time.   Genice Rouge [id number] ED CSW 12/22/2015 12:22 PM

## 2015-12-22 NOTE — [facility] Suicide Risk Assessment (Signed)
Suicide Risk Assessment  Discharge Assessment   [facility] Discharge Suicide Risk Assessment   Principal Problem: Adjustment disorder with mixed anxiety and depressed mood Discharge Diagnoses:  Patient Active Problem List   Diagnosis Date Noted  . Adjustment disorder with mixed anxiety and depressed mood [F43.23] 11/13/2014    Priority: High  . Cognitive deficits [R41.89] 11/13/2014  . Syncope and collapse [R55] 11/10/2014  . Orthostatic hypotension [I95.1] 11/10/2014  . Hypothyroidism [E03.9] 08/28/2014  . Essential hypertension [I10] 08/28/2014  . Atrial fibrillation, new onset ([place]) [I48.91] 08/28/2014  . Dizziness [R42] 08/28/2014  . BPH (benign prostatic hypertrophy) [N40.0] 08/28/2014  . Diabetes mellitus with no complication ([place]) [id number] 08/28/2014  . Risky sexual behavior [Z72.51] 08/28/2014  . A-fib ([place]) [I48.91] 08/28/2014  . Hypothyroid [E03.9] 09/10/2013  . BPH (benign prostatic hyperplasia) [N40.0] 09/10/2013    Total Time spent with patient: 45 minutes   Musculoskeletal: Strength & Muscle Tone: within normal limits Gait & Station: normal Patient leans: N/A  Psychiatric Specialty Exam: Physical Exam  Constitutional: He is oriented to person, place, and time. He appears well-developed and well-nourished.  HENT:  Head: Normocephalic.  Neck: Normal range of motion.  Respiratory: Effort normal.  Musculoskeletal: Normal range of motion.  Neurological: He is alert and oriented to person, place, and time.  Skin: Skin is warm and dry.  Psychiatric: His speech is normal and behavior is normal. Judgment and thought content normal. His mood appears anxious. Cognition and memory are normal.    Review of Systems  Constitutional: Negative.   HENT: Negative.   Eyes: Negative.   Respiratory: Negative.   Cardiovascular: Negative.   Gastrointestinal: Negative.   Genitourinary: Negative.   Musculoskeletal: Negative.   Skin: Negative.   Neurological: Negative.    Endo/Heme/Allergies: Negative.   Psychiatric/Behavioral: The patient is nervous/anxious.     Blood pressure 110/67, pulse 92, temperature 98.6 F (37 C), resp. rate 18, SpO2 98 %.There is no height or weight on file to calculate BMI.  General Appearance: Disheveled  Eye Contact:  Good  Speech:  Normal Rate  Volume:  Normal  Mood:  Anxious  Affect:  Congruent  Thought Process:  Coherent and Descriptions of Associations: Intact  Orientation:  Full (Time, Place, and Person)  Thought Content:  WDL  Suicidal Thoughts:  No  Homicidal Thoughts:  No  Memory:  Immediate;   Good Recent;   Good Remote;   Good  Judgement:  Fair  Insight:  Fair  Psychomotor Activity:  Increased  Concentration:  Concentration: Good and Attention Span: Good  Recall:  Good  Fund of Knowledge:  Good  Language:  Good  Akathisia:  No  Handed:  Right  AIMS (if indicated):     Assets:  Housing Leisure Time Physical Health Resilience Social Support  ADL's:  Intact  Cognition:  WNL  Sleep:       Mental Status Per Nursing Assessment::   On Admission:   fall  Demographic Factors:  Male, Age 19 or older and Caucasian  Loss Factors: NA  Historical Factors: NA  Risk Reduction Factors:   Sense of responsibility to family, Living with another person, especially a relative and Positive social support  Continued Clinical Symptoms:  Anxiety as he wants to leave  Cognitive Features That Contribute To Risk:  None    Suicide Risk:  Minimal: No identifiable suicidal ideation.  Patients presenting with no risk factors but with morbid ruminations; may be classified as minimal risk based on the severity of the  depressive symptoms    Plan Of Care/Follow-up recommendations:  Activity:  as tolerated Diet:  heart healthy diet  LORD, JAMISON, NP 12/22/2015, 10:14 AM
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
## 2015-12-22 NOTE — Consult Note (Signed)
[place] Psychiatry Consult   Reason for Consult:  Fall Referring Physician:  EDP Patient Identification: Joe [name] MRN:  [id number] Principal Diagnosis: Adjustment disorder with mixed anxiety and depressed mood Diagnosis:   Patient Active Problem List   Diagnosis Date Noted  . Adjustment disorder with mixed anxiety and depressed mood [F43.23] 11/13/2014    Priority: High  . Cognitive deficits [R41.89] 11/13/2014  . Syncope and collapse [R55] 11/10/2014  . Orthostatic hypotension [I95.1] 11/10/2014  . Hypothyroidism [E03.9] 08/28/2014  . Essential hypertension [I10] 08/28/2014  . Atrial fibrillation, new onset ([place]) [I48.91] 08/28/2014  . Dizziness [R42] 08/28/2014  . BPH (benign prostatic hypertrophy) [N40.0] 08/28/2014  . Diabetes mellitus with no complication ([place]) [M38.4] 08/28/2014  . Risky sexual behavior [Z72.51] 08/28/2014  . A-fib ([place]) [I48.91] 08/28/2014  . Hypothyroid [E03.9] 09/10/2013  . BPH (benign prostatic hyperplasia) [N40.0] 09/10/2013    Total Time spent with patient: 45 minutes  Subjective:   Joe [name] is a 82 y.o. male patient does not warrant admission.  HPI:  82 yo male who presented to the ED after a fall, cleared.  Patient is alert and oriented to time, place, person, and situation.  Denies suicidal/homicidal ideations, hallucinations, and alcohol/drug abuse.  He is ambulating without difficulties and can perform his own ADLs.  Stable for discharge.  Past Psychiatric History: none  Risk to Self: Suicidal Ideation: No Suicidal Intent: No Is patient at risk for suicide?: No Suicidal Plan?: No Access to Means: No What has been your use of drugs/alcohol within the last 12 months?: None reported How many times?: 0 Other Self Harm Risks: None Triggers for Past Attempts: None known Intentional Self Injurious Behavior: None Risk to Others: Homicidal Ideation: No Thoughts of Harm to Others: No Current Homicidal Intent: No Current  Homicidal Plan: No Access to Homicidal Means: No Identified Victim: No one History of harm to others?: No Assessment of Violence: None Noted Violent Behavior Description: None Does patient have access to weapons?: No Criminal Charges Pending?: No Does patient have a court date: No Prior Inpatient Therapy: Prior Inpatient Therapy: No Prior Therapy Dates: N/A Prior Therapy Facilty/Provider(s): N/A Reason for Treatment: N/a Prior Outpatient Therapy: Prior Outpatient Therapy: No Prior Therapy Dates: N/A Prior Therapy Facilty/Provider(s): N/A Reason for Treatment: N/A Does patient have an ACCT team?: No Does patient have Intensive In-House Services?  : No Does patient have [facility] services? : No Does patient have P4CC services?: No  Past Medical History:  Past Medical History:  Diagnosis Date  . Anxiety   . Atrial fibrillation with RVR ([place]) 08/28/2014   [name] 08/28/2014  . BPH (benign prostatic hypertrophy)    [name] 08/28/2014  . Hypertension    [name] 08/28/2014  . Hypothyroidism    [name] 08/28/2014  . Thyroid disease     Past Surgical History:  Procedure Laterality Date  . PARS PLANA VITRECTOMY Left 03/30/2013   Procedure: PARS PLANA VITRECTOMY 25 GAUGE FOR ENDOPHTHALMITIS;  Surgeon: Hurman Horn, MD;  Location: [place];  Service: Ophthalmology;  Laterality: Left;  LOCAL RETROBULBAR,,  USE MARCAINE 0.5%   Family History:  Family History  Problem Relation Age of Onset  . Hyperlipidemia Sister    Family Psychiatric  History: none Social History:  History  Alcohol Use No     History  Drug Use No    Social History   Social History  . Marital status: Widowed    Spouse name: N/A  . Number of children: N/A  .  Years of education: N/A   Social History Main Topics  . Smoking status: Never Smoker  . Smokeless tobacco: Never Used  . Alcohol use No  . Drug use: No  . Sexual activity: Not Asked   Other Topics Concern  . None   Social History Narrative   ** Merged  History Encounter **       ** Merged History Encounter **       Additional Social History:    Allergies:   Allergies  Allergen Reactions  . No Known Allergies     Labs:  Results for orders placed or performed during the hospital encounter of 12/21/15 (from the past 48 hour(s))  TSH     Status: Abnormal   Collection Time: 12/21/15  7:01 PM  Result Value Ref Range   TSH 15.274 (H) 0.350 - 4.500 uIU/mL  T4, free     Status: None   Collection Time: 12/21/15  7:01 PM  Result Value Ref Range   Free T4 0.77 0.61 - 1.12 ng/dL    Comment: (NOTE) Biotin ingestion may interfere with free T4 tests. If the results are inconsistent with the TSH level, previous test results, or the clinical presentation, then consider biotin interference. If needed, order repeat testing after stopping biotin. Performed at [facility]   Comprehensive metabolic panel     Status: Abnormal   Collection Time: 12/21/15  7:07 PM  Result Value Ref Range   Sodium 133 (L) 135 - 145 mmol/L   Potassium 4.2 3.5 - 5.1 mmol/L   Chloride 97 (L) 101 - 111 mmol/L   CO2 27 22 - 32 mmol/L   Glucose, Bld 302 (H) 65 - 99 mg/dL   BUN 13 6 - 20 mg/dL   Creatinine, Ser 0.98 0.61 - 1.24 mg/dL   Calcium 9.0 8.9 - 10.3 mg/dL   Total Protein 7.1 6.5 - 8.1 g/dL   Albumin 3.0 (L) 3.5 - 5.0 g/dL   AST 17 15 - 41 U/L   ALT 13 (L) 17 - 63 U/L   Alkaline Phosphatase 95 38 - 126 U/L   Total Bilirubin 0.4 0.3 - 1.2 mg/dL   GFR calc non Af Amer >60 >60 mL/min   GFR calc Af Amer >60 >60 mL/min    Comment: (NOTE) The eGFR has been calculated using the CKD EPI equation. This calculation has not been validated in all clinical situations. eGFR's persistently <60 mL/min signify possible Chronic Kidney Disease.    Anion gap 9 5 - 15  Ethanol     Status: None   Collection Time: 12/21/15  7:07 PM  Result Value Ref Range   Alcohol, Ethyl (B) <5 <5 mg/dL    Comment:        LOWEST DETECTABLE LIMIT FOR SERUM ALCOHOL IS 5  mg/dL FOR MEDICAL PURPOSES ONLY   cbc     Status: Abnormal   Collection Time: 12/21/15  7:07 PM  Result Value Ref Range   WBC 14.5 (H) 4.0 - 10.5 K/uL   RBC 4.56 4.22 - 5.81 MIL/uL   Hemoglobin 14.4 13.0 - 17.0 g/dL   HCT 40.1 39.0 - 52.0 %   MCV 87.9 78.0 - 100.0 fL   MCH 31.6 26.0 - 34.0 pg   MCHC 35.9 30.0 - 36.0 g/dL   RDW 13.9 11.5 - 15.5 %   Platelets 381 150 - 400 K/uL  Rapid urine drug screen (hospital performed)     Status: None   Collection Time: 12/21/15  9:01 PM  Result Value Ref Range   Opiates NONE DETECTED NONE DETECTED   Cocaine NONE DETECTED NONE DETECTED   Benzodiazepines NONE DETECTED NONE DETECTED   Amphetamines NONE DETECTED NONE DETECTED   Tetrahydrocannabinol NONE DETECTED NONE DETECTED   Barbiturates NONE DETECTED NONE DETECTED    Comment:        DRUG SCREEN FOR MEDICAL PURPOSES ONLY.  IF CONFIRMATION IS NEEDED FOR ANY PURPOSE, NOTIFY LAB WITHIN 5 DAYS.        LOWEST DETECTABLE LIMITS FOR URINE DRUG SCREEN Drug Class       Cutoff (ng/mL) Amphetamine      1000 Barbiturate      200 Benzodiazepine   638 Tricyclics       466 Opiates          300 Cocaine          300 THC              50   Urinalysis, Routine w reflex microscopic (not at [facility])     Status: Abnormal   Collection Time: 12/21/15  9:01 PM  Result Value Ref Range   Color, Urine YELLOW YELLOW   APPearance CLEAR CLEAR   Specific Gravity, Urine 1.021 1.005 - 1.030   pH 5.5 5.0 - 8.0   Glucose, UA 500 (A) NEGATIVE mg/dL   Hgb urine dipstick NEGATIVE NEGATIVE   Bilirubin Urine SMALL (A) NEGATIVE   Ketones, ur 15 (A) NEGATIVE mg/dL   Protein, ur NEGATIVE NEGATIVE mg/dL   Nitrite NEGATIVE NEGATIVE   Leukocytes, UA NEGATIVE NEGATIVE    Comment: MICROSCOPIC NOT DONE ON URINES WITH NEGATIVE PROTEIN, BLOOD, LEUKOCYTES, NITRITE, OR GLUCOSE <1000 mg/dL.  Troponin I     Status: None   Collection Time: 12/21/15 10:21 PM  Result Value Ref Range   Troponin I <0.03 <0.03 ng/mL    Current  Facility-Administered Medications  Medication Dose Route Frequency Provider Last Rate Last Dose  . apixaban (ELIQUIS) tablet 5 mg  5 mg Oral BID Antonietta Breach, PA-C   5 mg at 12/22/15 5993  . citalopram (CELEXA) tablet 20 mg  20 mg Oral Daily Antonietta Breach, PA-C   20 mg at 12/22/15 5701  . levothyroxine (SYNTHROID, LEVOTHROID) tablet 125 mcg  125 mcg Oral QAC breakfast Antonietta Breach, PA-C      . ramipril (ALTACE) capsule 5 mg  5 mg Oral Daily Antonietta Breach, PA-C   5 mg at 12/22/15 7793   Current Outpatient Prescriptions  Medication Sig Dispense Refill  . apixaban (ELIQUIS) 5 MG TABS tablet Take 1 tablet (5 mg total) by mouth 2 (two) times daily. (Patient not taking: Reported on 12/14/2015) 60 tablet 0  . citalopram (CELEXA) 20 MG tablet Take 1 tablet (20 mg total) by mouth daily. (Patient not taking: Reported on 12/30/2014) 90 tablet 3  . ibuprofen (ADVIL,MOTRIN) 600 MG tablet Take 1 tablet (600 mg total) by mouth every 6 (six) hours as needed. (Patient not taking: Reported on 12/14/2015) 30 tablet 0  . levothyroxine (SYNTHROID, LEVOTHROID) 125 MCG tablet Take 1 tablet (125 mcg total) by mouth daily before breakfast. (Patient not taking: Reported on 12/14/2015) 90 tablet 3  . ramipril (ALTACE) 5 MG capsule Take 1 capsule (5 mg total) by mouth daily. (Patient not taking: Reported on 12/14/2015) 90 capsule 3   Facility-Administered Medications Ordered in Other Encounters  Medication Dose Route Frequency Provider Last Rate Last Dose  . cyclopentolate (CYCLODRYL,CYCLOGYL) 1 % ophthalmic solution 1 drop  1 drop Left  Eye PRN Hurman Horn, MD      . gatifloxacin (ZYMAXID) 0.5 % ophthalmic drops 1 drop  1 drop Left Eye PRN Hurman Horn, MD        Musculoskeletal: Strength & Muscle Tone: within normal limits Gait & Station: normal Patient leans: N/A  Psychiatric Specialty Exam: Physical Exam  Constitutional: He is oriented to person, place, and time. He appears well-developed and well-nourished.  HENT:   Head: Normocephalic.  Neck: Normal range of motion.  Respiratory: Effort normal.  Musculoskeletal: Normal range of motion.  Neurological: He is alert and oriented to person, place, and time.  Skin: Skin is warm and dry.  Psychiatric: His speech is normal and behavior is normal. Judgment and thought content normal. His mood appears anxious. Cognition and memory are normal.    Review of Systems  Constitutional: Negative.   HENT: Negative.   Eyes: Negative.   Respiratory: Negative.   Cardiovascular: Negative.   Gastrointestinal: Negative.   Genitourinary: Negative.   Musculoskeletal: Negative.   Skin: Negative.   Neurological: Negative.   Endo/Heme/Allergies: Negative.   Psychiatric/Behavioral: The patient is nervous/anxious.     Blood pressure 110/67, pulse 92, temperature 98.6 F (37 C), resp. rate 18, SpO2 98 %.There is no height or weight on file to calculate BMI.  General Appearance: Disheveled  Eye Contact:  Good  Speech:  Normal Rate  Volume:  Normal  Mood:  Anxious  Affect:  Congruent  Thought Process:  Coherent and Descriptions of Associations: Intact  Orientation:  Full (Time, Place, and Person)  Thought Content:  WDL  Suicidal Thoughts:  No  Homicidal Thoughts:  No  Memory:  Immediate;   Good Recent;   Good Remote;   Good  Judgement:  Fair  Insight:  Fair  Psychomotor Activity:  Increased  Concentration:  Concentration: Good and Attention Span: Good  Recall:  Good  Fund of Knowledge:  Good  Language:  Good  Akathisia:  No  Handed:  Right  AIMS (if indicated):     Assets:  Housing Leisure Time Physical Health Resilience Social Support  ADL's:  Intact  Cognition:  WNL  Sleep:        Treatment Plan Summary: Daily contact with patient to assess and evaluate symptoms and progress in treatment, Medication management and Plan adjustment disorder with anxious mood:  -Crisis stabilization -Medication management:  Continue medical medications -Individual  counseling  Disposition: Daily contact with patient to assess and evaluate symptoms and progress in treatment, Medication management and Plan adjustment disorder with anxious mood:  Waylan Boga, NP 12/22/2015 10:08 AM   Patient seen face-to-face for psychiatric evaluation, chart reviewed and case discussed with the physician extender and developed treatment plan. Reviewed the information documented and agree with the treatment plan. Corena Pilgrim, MD

## 2015-12-22 NOTE — ED Notes (Signed)
Bed: WA31 Expected date:  Expected time:  Means of arrival:  Comments: 

## 2016-01-14 ENCOUNTER — Other Ambulatory Visit: Payer: Self-pay | Admitting: Family Medicine

## 2016-01-14 DIAGNOSIS — E119 Type 2 diabetes mellitus without complications: Secondary | ICD-10-CM

## 2016-01-14 DIAGNOSIS — F411 Generalized anxiety disorder: Secondary | ICD-10-CM

## 2016-01-21 ENCOUNTER — Other Ambulatory Visit: Payer: Self-pay

## 2016-01-21 DIAGNOSIS — F411 Generalized anxiety disorder: Secondary | ICD-10-CM

## 2016-01-21 NOTE — Telephone Encounter (Signed)
Pharm request refills on Citalopram and Ramipril. He left month AMA and the ED also. Per protocol refill for 1 month with no more refills until office visit. Please advise

## 2016-01-22 MED ORDER — RAMIPRIL 5 MG PO CAPS: 5.0000 mg | ORAL_CAPSULE | Freq: Every day | ORAL | 0 refills | Status: DC

## 2016-01-22 MED ORDER — CITALOPRAM HYDROBROMIDE 20 MG PO TABS: 20.0000 mg | ORAL_TABLET | Freq: Every day | ORAL | 0 refills | Status: DC

## 2016-01-22 NOTE — Telephone Encounter (Signed)
I did agree to sign a prescription for 30 day supply of each medication, but he did leave our office against medical advice last visit. Would need further evaluation in office before any further medication refills.

## 2016-04-28 DIAGNOSIS — R609 Edema, unspecified: Secondary | ICD-10-CM | POA: Diagnosis not present

## 2016-07-19 DIAGNOSIS — E118 Type 2 diabetes mellitus with unspecified complications: Secondary | ICD-10-CM | POA: Diagnosis not present

## 2016-07-19 DIAGNOSIS — E032 Hypothyroidism due to medicaments and other exogenous substances: Secondary | ICD-10-CM | POA: Diagnosis not present

## 2016-07-19 DIAGNOSIS — N390 Urinary tract infection, site not specified: Secondary | ICD-10-CM | POA: Diagnosis not present

## 2016-07-25 DIAGNOSIS — Z23 Encounter for immunization: Secondary | ICD-10-CM | POA: Diagnosis not present

## 2016-07-27 DIAGNOSIS — C44319 Basal cell carcinoma of skin of other parts of face: Secondary | ICD-10-CM | POA: Diagnosis not present

## 2016-07-28 ENCOUNTER — Ambulatory Visit (INDEPENDENT_AMBULATORY_CARE_PROVIDER_SITE_OTHER): Payer: Medicare Other | Admitting: Physician Assistant

## 2016-07-28 ENCOUNTER — Encounter: Payer: Self-pay | Admitting: Physician Assistant

## 2016-07-28 VITALS — BP 134/79 | HR 82 | Temp 98.1°F | Resp 18

## 2016-07-28 DIAGNOSIS — F0391 Unspecified dementia with behavioral disturbance: Secondary | ICD-10-CM

## 2016-07-28 DIAGNOSIS — R451 Restlessness and agitation: Secondary | ICD-10-CM | POA: Diagnosis not present

## 2016-07-28 DIAGNOSIS — T148XXA Other injury of unspecified body region, initial encounter: Secondary | ICD-10-CM

## 2016-07-28 MED ORDER — PERMETHRIN 5 % EX CREA: 1.0000 "application " | TOPICAL_CREAM | Freq: Once | CUTANEOUS | 0 refills | Status: AC

## 2016-07-28 MED ORDER — LORAZEPAM 0.5 MG PO TABS: 0.5000 mg | ORAL_TABLET | Freq: Two times a day (BID) | ORAL | 0 refills | Status: DC | PRN

## 2016-07-28 NOTE — Patient Instructions (Addendum)
Ativan - this will calm him down - he can have 1-2 pills up to 2x/day  Tonight - to somewhere other than his home - take off his dirty clothes and they need to be washed in hot water - get a new outfit for him - he needs to be bathed and then the cream (permethermin) applied neck and down (not on his face) leave this cream on for 8 hours and then in the am wash off - and let him wear clean clothes again  Take all his clothes (not towels or sheets) in a large plastic bag to [facility] after they have been washed in really hot water   Call [facility] in the am - [phone] speak to Vernona Rieger or [place]      IF you received an x-ray today, you will receive an invoice from [facility]. Please contact [facility] at [phone] with questions or concerns regarding your invoice.   IF you received labwork today, you will receive an invoice from [place]. Please contact [facility] at [phone] with questions or concerns regarding your invoice.   Our billing staff will not be able to assist you with questions regarding bills from these companies.  You will be contacted with the lab results as soon as they are available. The fastest way to get your results is to activate your My Chart account. Instructions are located on the last page of this paperwork. If you have not heard from [place] regarding the results in 2 weeks, please contact this office.

## 2016-07-28 NOTE — Progress Notes (Signed)
Joe [name]  MRN: [id number] DOB: 1924/12/13  PCP: Dwan Bolt, MD  Chief Complaint  Patient presents with  . bug check (examples in room) antianxiety med for new placeme    Subjective:  Pt presents to clinic for evaluation of possible bed bugs for admittance to [facility].  They tried to go today and they were turned away because they found bugs on him and they could not take him.  Niece and patient's sister in room with him - he is living in house infested with bed bugs that have been diagnosed by pest control.  They want to treat his house but they cannot while he in his home.    The family now has POA and they want/need him to be in a facility.  He has multiple medical problems that are followed by Dr Wilson Singer.  His home is very dirty and he is not clean and he is aggravated and difficult to deal with currently per family and does not want to have help or move.    Pt does not contribute to the history. - his sister and niece and brother in law give the history.  Review of Systems  Unable to perform ROS: Other    Patient Active Problem List   Diagnosis Date Noted  . Adjustment disorder with mixed anxiety and depressed mood 11/13/2014  . Cognitive deficits 11/13/2014  . Syncope and collapse 11/10/2014  . Orthostatic hypotension 11/10/2014  . Hypothyroidism 08/28/2014  . Essential hypertension 08/28/2014  . Atrial fibrillation, new onset ([place]) 08/28/2014  . Dizziness 08/28/2014  . BPH (benign prostatic hypertrophy) 08/28/2014  . Diabetes mellitus with no complication ([place]) 93/90/3009  . Risky sexual behavior 08/28/2014  . A-fib ([place]) 08/28/2014  . Hypothyroid 09/10/2013  . BPH (benign prostatic hyperplasia) 09/10/2013    Current Outpatient Prescriptions on File Prior to Visit  Medication Sig Dispense Refill  . apixaban (ELIQUIS) 5 MG TABS tablet Take 1 tablet (5 mg total) by mouth 2 (two) times daily. 60 tablet 0  . citalopram (CELEXA) 20 MG tablet Take 1  tablet (20 mg total) by mouth daily. 30 tablet 0  . ibuprofen (ADVIL,MOTRIN) 600 MG tablet Take 1 tablet (600 mg total) by mouth every 6 (six) hours as needed. 30 tablet 0  . levothyroxine (SYNTHROID, LEVOTHROID) 125 MCG tablet Take 1 tablet (125 mcg total) by mouth daily before breakfast. 90 tablet 3  . ramipril (ALTACE) 5 MG capsule Take 1 capsule (5 mg total) by mouth daily. 30 capsule 0   Current Facility-Administered Medications on File Prior to Visit  Medication Dose Route Frequency Provider Last Rate Last Dose  . cyclopentolate (CYCLODRYL,CYCLOGYL) 1 % ophthalmic solution 1 drop  1 drop Left Eye PRN Hurman Horn, MD      . gatifloxacin (ZYMAXID) 0.5 % ophthalmic drops 1 drop  1 drop Left Eye PRN Hurman Horn, MD        Allergies  Allergen Reactions  . No Known Allergies     Pt patients past, family and social history were reviewed and updated.   Objective:  BP 134/79   Pulse 82   Temp 98.1 F (36.7 C) (Oral)   Resp 18   SpO2 97%   Physical Exam  Constitutional: He is oriented to person, place, and time and well-developed, well-nourished, and in no distress.  HENT:  Head: Normocephalic and atraumatic.  Right Ear: External ear normal.  Left Ear: External ear normal.  Eyes: Conjunctivae are normal.  Neck: Normal  range of motion.  Pulmonary/Chest: Effort normal.  Neurological: He is alert and oriented to person, place, and time. Gait normal.  Skin: Skin is warm and dry. Rash noted.  Psychiatric: His mood appears anxious. He is agitated.    Assessment and Plan :  Bites - Plan: permethrin (ACTICIN) 5 % cream - bugs removed from patients shirt appear to be bed bugs -- he has healing bites on his legs. - will treat with permetharin to ensure there are no scabies or lice.  Irritability/agitation - Plan: LORazepam (ATIVAN) 0.5 MG tablet - to help with his agitation he can take 1-2 bid - this should help family with this transition and help the patient through this  transition  Dementia with behavioral disturbance, unspecified dementia type - Plan: LORazepam (ATIVAN) 0.5 MG tablet   Spoke to [place] at [facility] on duty to make decision about admitting contacted her supervisor - Alinda.   They agree with my plan of not going home and having a good bath and purchase clean clothes - his clothes and belongings that he wants to take to [facility] need to be washed in hot water and in a plastic bag for the facility to wash again.  The family needs to call the facility tomorrow at [phone] to be able to start intake tomorrow again.  Windell Hummingbird PA-C  [facility] 07/28/2016 6:56 PM

## 2016-07-28 NOTE — Progress Notes (Signed)
I called [facility] and they have not finished admitting him to place because they found bugs on him so they could not confirm meds with me  I picked 2 bugs off pt. In room and put in jar.  He needs clearance to return to [facility]. Family also wants something to help him relax through this transition car facitity

## 2016-07-30 ENCOUNTER — Encounter (HOSPITAL_COMMUNITY): Payer: Self-pay

## 2016-07-30 ENCOUNTER — Emergency Department (HOSPITAL_COMMUNITY)
Admission: EM | Admit: 2016-07-30 | Discharge: 2016-07-31 | Disposition: A | Discharge status: Home / Self Care | Payer: Medicare Other | Attending: Emergency Medicine | Admitting: Emergency Medicine

## 2016-07-30 DIAGNOSIS — E039 Hypothyroidism, unspecified: Secondary | ICD-10-CM | POA: Insufficient documentation

## 2016-07-30 DIAGNOSIS — S59901A Unspecified injury of right elbow, initial encounter: Secondary | ICD-10-CM | POA: Diagnosis present

## 2016-07-30 DIAGNOSIS — S5001XA Contusion of right elbow, initial encounter: Secondary | ICD-10-CM | POA: Insufficient documentation

## 2016-07-30 DIAGNOSIS — Y92129 Unspecified place in nursing home as the place of occurrence of the external cause: Secondary | ICD-10-CM | POA: Diagnosis not present

## 2016-07-30 DIAGNOSIS — Z7901 Long term (current) use of anticoagulants: Secondary | ICD-10-CM | POA: Diagnosis not present

## 2016-07-30 DIAGNOSIS — Z79899 Other long term (current) drug therapy: Secondary | ICD-10-CM | POA: Insufficient documentation

## 2016-07-30 DIAGNOSIS — T1490XA Injury, unspecified, initial encounter: Secondary | ICD-10-CM | POA: Diagnosis not present

## 2016-07-30 DIAGNOSIS — Y939 Activity, unspecified: Secondary | ICD-10-CM | POA: Diagnosis not present

## 2016-07-30 DIAGNOSIS — M25512 Pain in left shoulder: Secondary | ICD-10-CM | POA: Diagnosis not present

## 2016-07-30 DIAGNOSIS — D68318 Other hemorrhagic disorder due to intrinsic circulating anticoagulants, antibodies, or inhibitors: Secondary | ICD-10-CM | POA: Insufficient documentation

## 2016-07-30 DIAGNOSIS — E119 Type 2 diabetes mellitus without complications: Secondary | ICD-10-CM | POA: Insufficient documentation

## 2016-07-30 DIAGNOSIS — Y999 Unspecified external cause status: Secondary | ICD-10-CM | POA: Insufficient documentation

## 2016-07-30 DIAGNOSIS — I10 Essential (primary) hypertension: Secondary | ICD-10-CM | POA: Insufficient documentation

## 2016-07-30 DIAGNOSIS — R451 Restlessness and agitation: Secondary | ICD-10-CM | POA: Diagnosis not present

## 2016-07-30 DIAGNOSIS — W19XXXA Unspecified fall, initial encounter: Secondary | ICD-10-CM | POA: Diagnosis not present

## 2016-07-30 DIAGNOSIS — T148XXA Other injury of unspecified body region, initial encounter: Secondary | ICD-10-CM | POA: Diagnosis not present

## 2016-07-30 NOTE — ED Triage Notes (Signed)
Patient arrives by EMS from [name]. Patient had unwitnessed fall-just admitted to this facility yesterday or today. Family states he falls frequently and also lies down on the floor "for attention". Staff found patient on floor this evening with no injuries-previous bruising and skin tears from previous falls. EMS states SNF is [facility]. BP 120/70 CBG 334

## 2016-07-30 NOTE — Discharge Instructions (Signed)
1. Medications: usual home medications 2. Treatment: rest, drink plenty of fluids, keep wounds clean and dry 3. Follow Up: Please followup with your primary doctor in 2-3 days for discussion of your diagnoses and further evaluation after today's visit; if you do not have a primary care doctor use the resource guide provided to find one; Please return to the ER for altered mental status, new falls or other concerns

## 2016-07-30 NOTE — ED Notes (Signed)
No respiratory or acute distress noted alert and talking confused at times.

## 2016-07-30 NOTE — ED Notes (Signed)
Bed: [facility] Expected date:  Expected time:  Means of arrival:  Comments: EMS 83 yo male from SNF/fall-skin tear on hand

## 2016-07-30 NOTE — ED Provider Notes (Signed)
[facility] DEPT Provider Note   CSN: [id number] Arrival date & time: 07/30/16  2153   By signing my name below, I, Delton Prairie, attest that this documentation has been prepared under the direction and in the presence of  [employer], PA-C . Electronically Signed: Delton Prairie, ED Scribe. 07/30/16. 10:30 PM.   History   Chief Complaint Chief Complaint  Patient presents with  . Fall    HPI Comments: LEVEL 5 CAVEAT DUE TO DOCUMENTED COGNITIVE DEFICITS  Joe [name] is a 83 y.o. male, with a PMHx of atrial fibrillation (anticoagulated on Eliquis), HTN, DM and cognitive deficits, who presents to the Emergency Department, via EMS from nursing facility, status post an unwitnessed fall which occurred around 9 PM today. Pt states he fell on his left shoulder. Per nursing note, the pt was found on the floor by nursing home staff. No alleviating or modifying factors noted. Pt denies hitting his head, shoulder pain, abdominal pain, back pain or any other associated symptoms. No other complaints noted.   The history is provided by the patient and medical records. No language interpreter was used.    Past Medical History:  Diagnosis Date  . Anxiety   . Atrial fibrillation with RVR ([place]) 08/28/2014   [name] 08/28/2014  . BPH (benign prostatic hypertrophy)    [name] 08/28/2014  . Hypertension    [name] 08/28/2014  . Hypothyroidism    [name] 08/28/2014  . Thyroid disease     Patient Active Problem List   Diagnosis Date Noted  . Adjustment disorder with mixed anxiety and depressed mood 11/13/2014  . Cognitive deficits 11/13/2014  . Syncope and collapse 11/10/2014  . Orthostatic hypotension 11/10/2014  . Hypothyroidism 08/28/2014  . Essential hypertension 08/28/2014  . Atrial fibrillation, new onset ([place]) 08/28/2014  . Dizziness 08/28/2014  . BPH (benign prostatic hypertrophy) 08/28/2014  . Diabetes mellitus with no complication ([place]) 75/01/2584  . Risky sexual behavior  08/28/2014  . A-fib ([place]) 08/28/2014  . Hypothyroid 09/10/2013  . BPH (benign prostatic hyperplasia) 09/10/2013    Past Surgical History:  Procedure Laterality Date  . PARS PLANA VITRECTOMY Left 03/30/2013   Procedure: PARS PLANA VITRECTOMY 25 GAUGE FOR ENDOPHTHALMITIS;  Surgeon: Hurman Horn, MD;  Location: [facility];  Service: Ophthalmology;  Laterality: Left;  LOCAL RETROBULBAR,,  USE MARCAINE 0.5%       Home Medications    Prior to Admission medications   Medication Sig Start Date End Date Taking? Authorizing Provider  apixaban (ELIQUIS) 5 MG TABS tablet Take 1 tablet (5 mg total) by mouth 2 (two) times daily. 12/27/14   Robyn Haber, MD  citalopram (CELEXA) 20 MG tablet Take 1 tablet (20 mg total) by mouth daily. 01/22/16   Wendie Agreste, MD  ibuprofen (ADVIL,MOTRIN) 600 MG tablet Take 1 tablet (600 mg total) by mouth every 6 (six) hours as needed. 10/11/15   Tatyana Kirichenko, PA-C  levothyroxine (SYNTHROID, LEVOTHROID) 125 MCG tablet Take 1 tablet (125 mcg total) by mouth daily before breakfast. 12/28/14   Robyn Haber, MD  LORazepam (ATIVAN) 0.5 MG tablet Take 1 tablet (0.5 mg total) by mouth 2 (two) times daily as needed for anxiety. 07/28/16   Mancel Bale, PA-C  ramipril (ALTACE) 5 MG capsule Take 1 capsule (5 mg total) by mouth daily. 01/22/16   Wendie Agreste, MD    Family History Family History  Problem Relation Age of Onset  . Hyperlipidemia Sister     Social History Social History  Substance  Use Topics  . Smoking status: Never Smoker  . Smokeless tobacco: Never Used  . Alcohol use No     Allergies   No known allergies   Review of Systems Review of Systems  Unable to perform ROS: Other (documented cognitive deficits)   Physical Exam Updated Vital Signs BP 123/89 (BP Location: Right Arm)   Pulse (!) 103   Temp 98.3 F (36.8 C) (Oral)   Resp 16   SpO2 99%   Physical Exam  Constitutional: He appears well-developed and well-nourished. No  distress.  Awake, alert, nontoxic appearance  HENT:  Head: Normocephalic and atraumatic.  Mouth/Throat: Oropharynx is clear and moist. No oropharyngeal exudate.  Multiple scratches and open wounds in various stages of healing. No active bleeding or new wounds.   Eyes: Conjunctivae are normal. No scleral icterus.  Neck: Normal range of motion. Neck supple.  FROM. No midline or paraspinal tenderness. No step off or deformity.   Cardiovascular: Normal rate, regular rhythm and intact distal pulses.   Pulmonary/Chest: Effort normal and breath sounds normal. No respiratory distress. He has no wheezes.  Equal chest expansion  Abdominal: Soft. Bowel sounds are normal. He exhibits no mass. There is no tenderness. There is no rebound and no guarding.  Musculoskeletal: Normal range of motion. He exhibits no edema, tenderness or deformity.  FROM of all major joints of BUE and BLE. Numerous skin tears in various stages of healing, none appear new. Bruising noted to right elbow but no TTP.  Left shoulder without joint line tenderness, deformity or ecchymosis.   Neurological: He is alert.  Speech is clear and goal oriented Moves extremities without ataxia  Skin: Skin is warm and dry. He is not diaphoretic.  Psychiatric: He has a normal mood and affect.  Nursing note and vitals reviewed.    ED Treatments / Results  DIAGNOSTIC STUDIES:  Oxygen Saturation is 95% on RA, normal by my interpretation.    COORDINATION OF CARE:  10:24 PM Discussed treatment plan with pt at bedside and pt agreed to plan.  Procedures Procedures (including critical care time)  Medications Ordered in ED Medications - No data to display   Initial Impression / Assessment and Plan / ED Course  I have reviewed the triage vital signs and the nursing notes.  Pertinent labs & imaging results that were available during my care of the patient were reviewed by me and considered in my medical decision making (see chart for  details).  Clinical Course as of Jul 31 240  Sat Jul 30, 2016  2321 Discussed with Sherrill Raring who is his nephew who assists in medical decision making.  He reports the patient is diagnosed with dementia, but is generally able to orient to person and event.  He reports the patient is also generally able to locate ans describe his pain.  Facility reports pt was found on the floor and appeared uninjured, but it was their policy to refer out for evaluation.   Discussed risk vs benefit of CT head/neck and nephew is in agreement with not obtaining CT scan tonight as there is no physical evidence of trauma and pt denies hitting his head  [HM]  2332 The patient was discussed with and seen by Dr. Wilson Singer who agrees with the treatment plan.   [HM]    Clinical Course User Index [HM] Abigail Butts, PA-C    Patient presents after unwitnessed fall. No trauma to the head neck or back. Other wounds appear to be old and  healing. Patient with full range of motion of all extremities. Vital signs at triage document tachycardia however patient is not tachycardic on my exam. He is resting comfortably and has no complaints. Patient was discussed with his family who is in agreement with refraining from CT scan of head and neck tonight. Patient will be discharged back to his facility.  The patient was discussed with and seen by Dr. Wilson Singer who agrees with the treatment plan.   Final Clinical Impressions(s) / ED Diagnoses   Final diagnoses:  Fall, initial encounter  Chronic anticoagulation    New Prescriptions Discharge Medication List as of 07/30/2016 11:55 PM      I personally performed the services described in this documentation, which was scribed in my presence. The recorded information has been reviewed and is accurate.     Jarrett Soho Shyenne Maggard, PA-C 07/31/16 4403    Virgel Manifold, MD 08/01/16 1201

## 2016-07-31 DIAGNOSIS — R58 Hemorrhage, not elsewhere classified: Secondary | ICD-10-CM | POA: Diagnosis not present

## 2016-07-31 NOTE — ED Notes (Signed)
Patient resting comfortably with eyes closed, waiting on PTAR

## 2016-07-31 NOTE — ED Notes (Signed)
Report to [place] at [facility] notified of need for transport

## 2016-07-31 NOTE — ED Notes (Signed)
[facility] here to transport patient 

## 2016-08-02 DIAGNOSIS — R531 Weakness: Secondary | ICD-10-CM | POA: Diagnosis not present

## 2016-08-02 DIAGNOSIS — Z7984 Long term (current) use of oral hypoglycemic drugs: Secondary | ICD-10-CM | POA: Diagnosis not present

## 2016-08-02 DIAGNOSIS — N400 Enlarged prostate without lower urinary tract symptoms: Secondary | ICD-10-CM | POA: Diagnosis not present

## 2016-08-02 DIAGNOSIS — R600 Localized edema: Secondary | ICD-10-CM | POA: Diagnosis not present

## 2016-08-02 DIAGNOSIS — I4891 Unspecified atrial fibrillation: Secondary | ICD-10-CM | POA: Diagnosis not present

## 2016-08-02 DIAGNOSIS — E119 Type 2 diabetes mellitus without complications: Secondary | ICD-10-CM | POA: Diagnosis not present

## 2016-08-02 DIAGNOSIS — R269 Unspecified abnormalities of gait and mobility: Secondary | ICD-10-CM | POA: Diagnosis not present

## 2016-08-02 DIAGNOSIS — R296 Repeated falls: Secondary | ICD-10-CM | POA: Diagnosis not present

## 2016-08-02 DIAGNOSIS — E039 Hypothyroidism, unspecified: Secondary | ICD-10-CM | POA: Diagnosis not present

## 2016-08-06 ENCOUNTER — Encounter (HOSPITAL_COMMUNITY): Payer: Self-pay | Admitting: Emergency Medicine

## 2016-08-06 ENCOUNTER — Emergency Department (HOSPITAL_COMMUNITY): Payer: Medicare Other

## 2016-08-06 ENCOUNTER — Emergency Department (HOSPITAL_COMMUNITY)
Admission: EM | Admit: 2016-08-06 | Discharge: 2016-08-07 | Disposition: A | Discharge status: Home / Self Care | Payer: Medicare Other | Source: Home / Self Care | Attending: Emergency Medicine | Admitting: Emergency Medicine

## 2016-08-06 DIAGNOSIS — Y939 Activity, unspecified: Secondary | ICD-10-CM

## 2016-08-06 DIAGNOSIS — Y999 Unspecified external cause status: Secondary | ICD-10-CM | POA: Insufficient documentation

## 2016-08-06 DIAGNOSIS — Z7901 Long term (current) use of anticoagulants: Secondary | ICD-10-CM | POA: Insufficient documentation

## 2016-08-06 DIAGNOSIS — I10 Essential (primary) hypertension: Secondary | ICD-10-CM | POA: Insufficient documentation

## 2016-08-06 DIAGNOSIS — S0003XA Contusion of scalp, initial encounter: Secondary | ICD-10-CM

## 2016-08-06 DIAGNOSIS — Y929 Unspecified place or not applicable: Secondary | ICD-10-CM

## 2016-08-06 DIAGNOSIS — S199XXA Unspecified injury of neck, initial encounter: Secondary | ICD-10-CM | POA: Diagnosis not present

## 2016-08-06 DIAGNOSIS — E039 Hypothyroidism, unspecified: Secondary | ICD-10-CM | POA: Insufficient documentation

## 2016-08-06 DIAGNOSIS — Y92129 Unspecified place in nursing home as the place of occurrence of the external cause: Secondary | ICD-10-CM | POA: Diagnosis not present

## 2016-08-06 DIAGNOSIS — W19XXXA Unspecified fall, initial encounter: Secondary | ICD-10-CM

## 2016-08-06 DIAGNOSIS — S0990XA Unspecified injury of head, initial encounter: Secondary | ICD-10-CM | POA: Diagnosis not present

## 2016-08-06 DIAGNOSIS — E119 Type 2 diabetes mellitus without complications: Secondary | ICD-10-CM | POA: Diagnosis not present

## 2016-08-06 DIAGNOSIS — R9431 Abnormal electrocardiogram [ECG] [EKG]: Secondary | ICD-10-CM | POA: Diagnosis not present

## 2016-08-06 DIAGNOSIS — S5002XA Contusion of left elbow, initial encounter: Secondary | ICD-10-CM | POA: Diagnosis not present

## 2016-08-06 DIAGNOSIS — Y9389 Activity, other specified: Secondary | ICD-10-CM | POA: Diagnosis not present

## 2016-08-06 DIAGNOSIS — S064X0A Epidural hemorrhage without loss of consciousness, initial encounter: Secondary | ICD-10-CM | POA: Diagnosis not present

## 2016-08-06 DIAGNOSIS — S0101XA Laceration without foreign body of scalp, initial encounter: Secondary | ICD-10-CM | POA: Diagnosis not present

## 2016-08-06 DIAGNOSIS — W06XXXA Fall from bed, initial encounter: Secondary | ICD-10-CM | POA: Diagnosis not present

## 2016-08-06 DIAGNOSIS — R404 Transient alteration of awareness: Secondary | ICD-10-CM | POA: Diagnosis not present

## 2016-08-06 DIAGNOSIS — I951 Orthostatic hypotension: Secondary | ICD-10-CM | POA: Diagnosis not present

## 2016-08-06 DIAGNOSIS — Z79899 Other long term (current) drug therapy: Secondary | ICD-10-CM | POA: Insufficient documentation

## 2016-08-06 NOTE — ED Triage Notes (Signed)
Brought in by EMS from [facility] for evaluation after his unwitnessed fall tonight.  Pt was found lying on floor beside his bed, apparent fell off bed.  Pt sustained a large hematoma to left forehead with abrasion, bleeding controlled; pt also sustained abrasions to left fingers/knuckles, bleeding controlled.  Staff at the facility reports that pt has hx of multiple falls and has dementia.  Pt arrived to ED alert and oriented x 1 which is his norm, c-collar in place.

## 2016-08-06 NOTE — ED Notes (Signed)
Bed: [facility] Expected date:  Expected time:  Means of arrival:  Comments: No bed 

## 2016-08-06 NOTE — ED Provider Notes (Signed)
[facility] DEPT Provider Note   CSN: [id number] Arrival date & time: 08/06/16  2241  By signing my name below, I, Hansel Feinstein, attest that this documentation has been prepared under the direction and in the presence of [employer], PA-C. Electronically Signed: Hansel Feinstein, ED Scribe. 08/06/16. 11:25 PM.    History   Chief Complaint Chief Complaint  Patient presents with  . Fall  . Head Injury   LEVEL 5 CAVEAT: HPI and ROS limited due to dementia   HPI Joe [name] is a 83 y.o. male with h/o dementia who presents to the Emergency Department in a C-collar from [facility] for evaluation of injuries s/p unwitnessed fall earlier this evening. Per SNF report, they suspect he fell off his bed as he was found on the floor beside his bed. Pt presents with a hematoma and abrasion to the left forehead.  He states he is not currently any pain.   The history is provided by the patient and the nursing home. The history is limited by the absence of a caregiver. No language interpreter was used.    Past Medical History:  Diagnosis Date  . Anxiety   . Atrial fibrillation with RVR ([place]) 08/28/2014   [name] 08/28/2014  . BPH (benign prostatic hypertrophy)    [name] 08/28/2014  . Hypertension    [name] 08/28/2014  . Hypothyroidism    [name] 08/28/2014  . Thyroid disease     Patient Active Problem List   Diagnosis Date Noted  . Adjustment disorder with mixed anxiety and depressed mood 11/13/2014  . Cognitive deficits 11/13/2014  . Syncope and collapse 11/10/2014  . Orthostatic hypotension 11/10/2014  . Hypothyroidism 08/28/2014  . Essential hypertension 08/28/2014  . Atrial fibrillation, new onset ([place]) 08/28/2014  . Dizziness 08/28/2014  . BPH (benign prostatic hypertrophy) 08/28/2014  . Diabetes mellitus with no complication ([place]) 40/01/2724  . Risky sexual behavior 08/28/2014  . A-fib ([place]) 08/28/2014  . Hypothyroid 09/10/2013  . BPH (benign prostatic hyperplasia)  09/10/2013    Past Surgical History:  Procedure Laterality Date  . PARS PLANA VITRECTOMY Left 03/30/2013   Procedure: PARS PLANA VITRECTOMY 25 GAUGE FOR ENDOPHTHALMITIS;  Surgeon: Hurman Horn, MD;  Location: [facility];  Service: Ophthalmology;  Laterality: Left;  LOCAL RETROBULBAR,,  USE MARCAINE 0.5%       Home Medications    Prior to Admission medications   Medication Sig Start Date End Date Taking? Authorizing Provider  Alogliptin-Pioglitazone (OSENI) 25-15 MG TABS Take 1 tablet by mouth daily.   Yes Historical Provider, MD  apixaban (ELIQUIS) 5 MG TABS tablet Take 1 tablet (5 mg total) by mouth 2 (two) times daily. 12/27/14  Yes Robyn Haber, MD  levothyroxine (SYNTHROID, LEVOTHROID) 100 MCG tablet Take 100 mcg by mouth daily before breakfast.   Yes Historical Provider, MD  LORazepam (ATIVAN) 0.5 MG tablet Take 1 tablet (0.5 mg total) by mouth 2 (two) times daily as needed for anxiety. Patient taking differently: Take 0.5 mg by mouth 3 (three) times daily. May also take twice daily as needed for anxiety 07/28/16  Yes Sarah L Weber, PA-C  metoprolol tartrate (LOPRESSOR) 25 MG tablet Take 25 mg by mouth daily.   Yes Historical Provider, MD  pioglitazone (ACTOS) 15 MG tablet Take 15 mg by mouth daily.   Yes Historical Provider, MD  ramipril (ALTACE) 1.25 MG capsule Take 1.25 mg by mouth daily.   Yes Historical Provider, MD  risperiDONE (RISPERDAL) 0.5 MG tablet Take 0.5 mg by  mouth 2 (two) times daily.   Yes Historical Provider, MD  tamsulosin (FLOMAX) 0.4 MG CAPS capsule Take 0.4 mg by mouth daily.   Yes Historical Provider, MD  torsemide (DEMADEX) 20 MG tablet Take 20 mg by mouth daily.   Yes Historical Provider, MD  citalopram (CELEXA) 20 MG tablet Take 1 tablet (20 mg total) by mouth daily. Patient not taking: Reported on 08/06/2016 01/22/16   Wendie Agreste, MD  levothyroxine (SYNTHROID, LEVOTHROID) 125 MCG tablet Take 1 tablet (125 mcg total) by mouth daily before  breakfast. Patient not taking: Reported on 08/06/2016 12/28/14   Robyn Haber, MD  ramipril (ALTACE) 5 MG capsule Take 1 capsule (5 mg total) by mouth daily. Patient not taking: Reported on 08/06/2016 01/22/16   Wendie Agreste, MD    Family History Family History  Problem Relation Age of Onset  . Hyperlipidemia Sister     Social History Social History  Substance Use Topics  . Smoking status: Never Smoker  . Smokeless tobacco: Never Used  . Alcohol use No     Allergies   No known allergies   Review of Systems Review of Systems  Unable to perform ROS: Dementia     Physical Exam Updated Vital Signs BP 106/72   Pulse 97   Temp 97.7 F (36.5 C)   Resp 13   SpO2 91%   Physical Exam  Constitutional: He appears well-developed and well-nourished. No distress.  HENT:  Head: Normocephalic and atraumatic.  Large hematoma to left frontal/parietal scalp. Overlying abrasion. Bleeding controlled. No skull instability. No battle's sign or raccoon's eyes. Symmetric eyebrow raise. No facial drooping.   Eyes: Conjunctivae and EOM are normal. No scleral icterus.  Anisocoria L>R  Neck:  C-collar in place  Cardiovascular: Normal rate, regular rhythm and intact distal pulses.   Pulmonary/Chest: Effort normal. No respiratory distress.  Respirations even and unlabored  Musculoskeletal: Normal range of motion.  Neurological: He is alert.  Alert to self. Patient moving all extremities.  Skin: Skin is warm and dry. No rash noted. He is not diaphoretic. No erythema. No pallor.  No apparent bruising to chest or abdomen.  Psychiatric: He has a normal mood and affect. His behavior is normal.  Nursing note and vitals reviewed.    ED Treatments / Results   DIAGNOSTIC STUDIES: Oxygen Saturation is 98% on RA, normal by my interpretation.    COORDINATION OF CARE: 11:22 PM Will order CT head and neck.     Labs (all labs ordered are listed, but only abnormal results are  displayed) Labs Reviewed - No data to display  EKG  EKG Interpretation  Date/Time:  Saturday August 06 2016 22:57:24 EDT Ventricular Rate:  104 PR Interval:    QRS Duration: 125 QT Interval:  384 QTC Calculation: 506 R Axis:   66 Text Interpretation:  Atrial fibrillation Right bundle branch block No significant change since last tracing Confirmed by Gerald Leitz ([id number]) on 08/06/2016 10:59:41 PM       Radiology Ct Head Wo Contrast  Result Date: 08/07/2016 CLINICAL DATA:  Unwitnessed fall, found lying on floor. EXAM: CT HEAD WITHOUT CONTRAST CT CERVICAL SPINE WITHOUT CONTRAST TECHNIQUE: Multidetector CT imaging of the head and cervical spine was performed following the standard protocol without intravenous contrast. Multiplanar CT image reconstructions of the cervical spine were also generated. COMPARISON:  12/21/2015 FINDINGS: CT HEAD FINDINGS Brain: There is no intracranial hemorrhage, mass or evidence of acute infarction. There is moderate generalized atrophy. There is moderate  chronic microvascular ischemic change. There is no significant extra-axial fluid collection. No acute intracranial findings are evident. Vascular: No hyperdense vessel or unexpected calcification. Skull: Normal. Negative for fracture or focal lesion. Sinuses/Orbits: No acute finding. Other: Left frontal scalp hematoma. CT CERVICAL SPINE FINDINGS Alignment: Normal. Skull base and vertebrae: No acute fracture. No primary bone lesion or focal pathologic process. Soft tissues and spinal canal: No prevertebral fluid or swelling. No visible canal hematoma. Disc levels: Moderate cervical degenerative disc changes. Moderate facet arthritis. Upper chest: Negative. Other: None IMPRESSION: 1. No acute intracranial findings. There is moderate generalized atrophy and chronic appearing white matter hypodensities which likely represent small vessel ischemic disease. 2. Negative for acute cervical spine fracture Electronically Signed    By: Andreas Newport M.D.   On: 08/07/2016 01:14   Ct Cervical Spine Wo Contrast  Result Date: 08/07/2016 CLINICAL DATA:  Unwitnessed fall, found lying on floor. EXAM: CT HEAD WITHOUT CONTRAST CT CERVICAL SPINE WITHOUT CONTRAST TECHNIQUE: Multidetector CT imaging of the head and cervical spine was performed following the standard protocol without intravenous contrast. Multiplanar CT image reconstructions of the cervical spine were also generated. COMPARISON:  12/21/2015 FINDINGS: CT HEAD FINDINGS Brain: There is no intracranial hemorrhage, mass or evidence of acute infarction. There is moderate generalized atrophy. There is moderate chronic microvascular ischemic change. There is no significant extra-axial fluid collection. No acute intracranial findings are evident. Vascular: No hyperdense vessel or unexpected calcification. Skull: Normal. Negative for fracture or focal lesion. Sinuses/Orbits: No acute finding. Other: Left frontal scalp hematoma. CT CERVICAL SPINE FINDINGS Alignment: Normal. Skull base and vertebrae: No acute fracture. No primary bone lesion or focal pathologic process. Soft tissues and spinal canal: No prevertebral fluid or swelling. No visible canal hematoma. Disc levels: Moderate cervical degenerative disc changes. Moderate facet arthritis. Upper chest: Negative. Other: None IMPRESSION: 1. No acute intracranial findings. There is moderate generalized atrophy and chronic appearing white matter hypodensities which likely represent small vessel ischemic disease. 2. Negative for acute cervical spine fracture Electronically Signed   By: Andreas Newport M.D.   On: 08/07/2016 01:14    Procedures Procedures (including critical care time)  Medications Ordered in ED Medications - No data to display   Initial Impression / Assessment and Plan / ED Course  I have reviewed the triage vital signs and the nursing notes.  Pertinent labs & imaging results that were available during my care of  the patient were reviewed by me and considered in my medical decision making (see chart for details).     83 year old male presents to the emergency department from his nursing facility after an unwitnessed fall out of bed. Patient presents with hematoma and overlying abrasion to his scalp. No skull instability. No Battle sign or raccoons eyes. Patient mentating at baseline, per facility. He is alert to self. No focal deficits noted on exam.  Head CT and cervical spine imaging ordered given evidence of acute head injury on Eliquis. Imaging today is reassuring without signs of skull fracture or hemorrhage. No acute, traumatic changes to the cervical spine. Patient has been resting comfortably since arrival. I believe he is stable for discharge back to his facility. Return precautions provided at discharge. Will transport via [facility].   Final Clinical Impressions(s) / ED Diagnoses   Final diagnoses:  Fall, initial encounter  Hematoma of scalp, initial encounter    New Prescriptions New Prescriptions   No medications on file    I personally performed the services described in this  documentation, which was scribed in my presence. The recorded information has been reviewed and is accurate.      Antonietta Breach, PA-C 08/07/16 [phone]

## 2016-08-06 NOTE — ED Notes (Signed)
Bed: PN30 Expected date:  Expected time:  Means of arrival:  Comments: 9f fall with dementia.

## 2016-08-07 ENCOUNTER — Emergency Department (HOSPITAL_COMMUNITY): Payer: Medicare Other

## 2016-08-07 ENCOUNTER — Emergency Department (HOSPITAL_COMMUNITY)
Admission: EM | Admit: 2016-08-07 | Discharge: 2016-08-07 | Disposition: A | Discharge status: Home / Self Care | Payer: Medicare Other | Attending: Emergency Medicine | Admitting: Emergency Medicine

## 2016-08-07 DIAGNOSIS — Y9389 Activity, other specified: Secondary | ICD-10-CM | POA: Insufficient documentation

## 2016-08-07 DIAGNOSIS — I951 Orthostatic hypotension: Secondary | ICD-10-CM | POA: Insufficient documentation

## 2016-08-07 DIAGNOSIS — S0003XA Contusion of scalp, initial encounter: Secondary | ICD-10-CM | POA: Diagnosis not present

## 2016-08-07 DIAGNOSIS — Y999 Unspecified external cause status: Secondary | ICD-10-CM | POA: Insufficient documentation

## 2016-08-07 DIAGNOSIS — Y92129 Unspecified place in nursing home as the place of occurrence of the external cause: Secondary | ICD-10-CM | POA: Insufficient documentation

## 2016-08-07 DIAGNOSIS — S0101XA Laceration without foreign body of scalp, initial encounter: Secondary | ICD-10-CM | POA: Insufficient documentation

## 2016-08-07 DIAGNOSIS — E119 Type 2 diabetes mellitus without complications: Secondary | ICD-10-CM | POA: Insufficient documentation

## 2016-08-07 DIAGNOSIS — S0181XA Laceration without foreign body of other part of head, initial encounter: Secondary | ICD-10-CM | POA: Diagnosis not present

## 2016-08-07 DIAGNOSIS — Z7901 Long term (current) use of anticoagulants: Secondary | ICD-10-CM | POA: Insufficient documentation

## 2016-08-07 DIAGNOSIS — S0990XA Unspecified injury of head, initial encounter: Secondary | ICD-10-CM | POA: Diagnosis not present

## 2016-08-07 DIAGNOSIS — I621 Nontraumatic extradural hemorrhage: Secondary | ICD-10-CM | POA: Diagnosis not present

## 2016-08-07 DIAGNOSIS — I10 Essential (primary) hypertension: Secondary | ICD-10-CM | POA: Insufficient documentation

## 2016-08-07 DIAGNOSIS — S5002XA Contusion of left elbow, initial encounter: Secondary | ICD-10-CM | POA: Diagnosis not present

## 2016-08-07 DIAGNOSIS — Z79899 Other long term (current) drug therapy: Secondary | ICD-10-CM | POA: Insufficient documentation

## 2016-08-07 DIAGNOSIS — E039 Hypothyroidism, unspecified: Secondary | ICD-10-CM | POA: Insufficient documentation

## 2016-08-07 DIAGNOSIS — W19XXXA Unspecified fall, initial encounter: Secondary | ICD-10-CM | POA: Diagnosis not present

## 2016-08-07 DIAGNOSIS — S199XXA Unspecified injury of neck, initial encounter: Secondary | ICD-10-CM | POA: Diagnosis not present

## 2016-08-07 DIAGNOSIS — W06XXXA Fall from bed, initial encounter: Secondary | ICD-10-CM | POA: Insufficient documentation

## 2016-08-07 LAB — CBC WITH DIFFERENTIAL/PLATELET
Basophils Absolute: 0 10*3/uL (ref 0.0–0.1)
Basophils Relative: 0 %
Eosinophils Absolute: 0.2 10*3/uL (ref 0.0–0.7)
Eosinophils Relative: 2 %
HCT: 35 % — ABNORMAL LOW (ref 39.0–52.0)
Hemoglobin: 11.7 g/dL — ABNORMAL LOW (ref 13.0–17.0)
Lymphocytes Relative: 17 %
Lymphs Abs: 1.7 10*3/uL (ref 0.7–4.0)
MCH: 29.6 pg (ref 26.0–34.0)
MCHC: 33.4 g/dL (ref 30.0–36.0)
MCV: 88.6 fL (ref 78.0–100.0)
Monocytes Absolute: 0.8 10*3/uL (ref 0.1–1.0)
Monocytes Relative: 8 %
Neutro Abs: 7.1 10*3/uL (ref 1.7–7.7)
Neutrophils Relative %: 73 %
Platelets: 294 10*3/uL (ref 150–400)
RBC: 3.95 MIL/uL — ABNORMAL LOW (ref 4.22–5.81)
RDW: 14.5 % (ref 11.5–15.5)
WBC: 9.8 10*3/uL (ref 4.0–10.5)

## 2016-08-07 LAB — COMPREHENSIVE METABOLIC PANEL WITH GFR
ALT: 13 U/L — ABNORMAL LOW (ref 17–63)
AST: 20 U/L (ref 15–41)
Albumin: 2.8 g/dL — ABNORMAL LOW (ref 3.5–5.0)
Alkaline Phosphatase: 98 U/L (ref 38–126)
Anion gap: 12 (ref 5–15)
BUN: 10 mg/dL (ref 6–20)
CO2: 29 mmol/L (ref 22–32)
Calcium: 8.6 mg/dL — ABNORMAL LOW (ref 8.9–10.3)
Chloride: 98 mmol/L — ABNORMAL LOW (ref 101–111)
Creatinine, Ser: 1.16 mg/dL (ref 0.61–1.24)
GFR calc Af Amer: >60 mL/min
GFR calc non Af Amer: 54 mL/min — ABNORMAL LOW
Glucose, Bld: 183 mg/dL — ABNORMAL HIGH (ref 65–99)
Potassium: 3 mmol/L — ABNORMAL LOW (ref 3.5–5.1)
Sodium: 139 mmol/L (ref 135–145)
Total Bilirubin: 1 mg/dL (ref 0.3–1.2)
Total Protein: 6.8 g/dL (ref 6.5–8.1)

## 2016-08-07 LAB — I-STAT TROPONIN, ED: TROPONIN I, POC: 0.02 ng/mL (ref 0.00–0.08)

## 2016-08-07 LAB — PROTIME-INR
INR: 1.28
Prothrombin Time: 16.1 s — ABNORMAL HIGH (ref 11.4–15.2)

## 2016-08-07 MED ORDER — POTASSIUM CHLORIDE CRYS ER 20 MEQ PO TBCR
40.0000 meq | EXTENDED_RELEASE_TABLET | Freq: Once | ORAL | Status: AC
  Administered 2016-08-07: 40 meq via ORAL
  Filled 2016-08-07: qty 2

## 2016-08-07 MED ORDER — SODIUM CHLORIDE 0.9 % IV BOLUS (SEPSIS)
500.0000 mL | Freq: Once | INTRAVENOUS | Status: AC
  Administered 2016-08-07: 500 mL via INTRAVENOUS

## 2016-08-07 MED ORDER — SODIUM CHLORIDE 0.9 % IV BOLUS (SEPSIS)
1000.0000 mL | Freq: Once | INTRAVENOUS | Status: AC
  Administered 2016-08-07: 1000 mL via INTRAVENOUS

## 2016-08-07 NOTE — ED Triage Notes (Signed)
Pt presents with small laceration to L side of head and abrasion to L elbow from unwitnessed fall today at [facility].  Pt found seated on floor by staff, reported to paramedic that pt is baseline mental status.

## 2016-08-07 NOTE — Discharge Instructions (Signed)
Your blood pressure is low. You need to hold all your blood pressure medicines (altace, flomax, metoprolol) for 2 days. Repeat blood pressure in 2 days and if it is > 140/90 then restart blood pressure meds. If it is still low, please talk to your doctor at the nursing home and continue to hold the blood pressure medicines.   See your doctor  Fall precautions   Return to ER if you have fall, passing out, uncontrolled bleeding, headaches, vomiting.

## 2016-08-07 NOTE — ED Provider Notes (Signed)
[facility] DEPT Provider Note   CSN: [id number] Arrival date & time: 08/07/16  1305     History   Chief Complaint Chief Complaint  Patient presents with  . Fall    HPI Joe [name] is a 83 y.o. male history atrial fibrillation, hypertension here presenting with Fall. He is from a nursing home and actually fell last night and went to [facility] and was noted to have left frontal hematoma. CT head and neck at that time was unremarkable. Patient is bed bound at baseline and was trying to get out of bed and fell again and hit the posterior scalp this time. He also had a left elbow abrasion as noted by EMS. Patient did not know how he fell. Patient is on eliquis   The history is provided by the patient and the EMS personnel.   Level V caveat- dementia   Past Medical History:  Diagnosis Date  . Anxiety   . Atrial fibrillation with RVR ([place]) 08/28/2014   [name] 08/28/2014  . BPH (benign prostatic hypertrophy)    [name] 08/28/2014  . Hypertension    [name] 08/28/2014  . Hypothyroidism    [name] 08/28/2014  . Thyroid disease     Patient Active Problem List   Diagnosis Date Noted  . Adjustment disorder with mixed anxiety and depressed mood 11/13/2014  . Cognitive deficits 11/13/2014  . Syncope and collapse 11/10/2014  . Orthostatic hypotension 11/10/2014  . Hypothyroidism 08/28/2014  . Essential hypertension 08/28/2014  . Atrial fibrillation, new onset ([place]) 08/28/2014  . Dizziness 08/28/2014  . BPH (benign prostatic hypertrophy) 08/28/2014  . Diabetes mellitus with no complication ([place]) 68[birth year]34/1962  . Risky sexual behavior 08/28/2014  . A-fib ([place]) 08/28/2014  . Hypothyroid 09/10/2013  . BPH (benign prostatic hyperplasia) 09/10/2013    Past Surgical History:  Procedure Laterality Date  . PARS PLANA VITRECTOMY Left 03/30/2013   Procedure: PARS PLANA VITRECTOMY 25 GAUGE FOR ENDOPHTHALMITIS;  Surgeon: Hurman Horn, MD;  Location: [facility];  Service:  Ophthalmology;  Laterality: Left;  LOCAL RETROBULBAR,,  USE MARCAINE 0.5%       Home Medications    Prior to Admission medications   Medication Sig Start Date End Date Taking? Authorizing Provider  Alogliptin-Pioglitazone (OSENI) 25-15 MG TABS Take 1 tablet by mouth daily.   Yes Historical Provider, MD  apixaban (ELIQUIS) 5 MG TABS tablet Take 1 tablet (5 mg total) by mouth 2 (two) times daily. 12/27/14  Yes Robyn Haber, MD  levothyroxine (SYNTHROID, LEVOTHROID) 100 MCG tablet Take 100 mcg by mouth daily before breakfast.   Yes Historical Provider, MD  levothyroxine (SYNTHROID, LEVOTHROID) 125 MCG tablet Take 1 tablet (125 mcg total) by mouth daily before breakfast. 12/28/14  Yes Robyn Haber, MD  LORazepam (ATIVAN) 0.5 MG tablet Take 1 tablet (0.5 mg total) by mouth 2 (two) times daily as needed for anxiety. Patient taking differently: Take 0.5 mg by mouth 3 (three) times daily. May also take twice daily as needed for anxiety 07/28/16  Yes Sarah L Weber, PA-C  metoprolol tartrate (LOPRESSOR) 25 MG tablet Take 25 mg by mouth daily.   Yes Historical Provider, MD  pioglitazone (ACTOS) 15 MG tablet Take 15 mg by mouth daily.   Yes Historical Provider, MD  ramipril (ALTACE) 1.25 MG capsule Take 1.25 mg by mouth daily.   Yes Historical Provider, MD  ramipril (ALTACE) 5 MG capsule Take 1 capsule (5 mg total) by mouth daily. 01/22/16  Yes Wendie Agreste, MD  risperiDONE (  RISPERDAL) 0.5 MG tablet Take 0.5 mg by mouth 2 (two) times daily.   Yes Historical Provider, MD  tamsulosin (FLOMAX) 0.4 MG CAPS capsule Take 0.4 mg by mouth daily.   Yes Historical Provider, MD  citalopram (CELEXA) 20 MG tablet Take 1 tablet (20 mg total) by mouth daily. Patient not taking: Reported on 08/06/2016 01/22/16   Wendie Agreste, MD  torsemide (DEMADEX) 20 MG tablet Take 20 mg by mouth daily.    Historical Provider, MD    Family History Family History  Problem Relation Age of Onset  . Hyperlipidemia Sister      Social History Social History  Substance Use Topics  . Smoking status: Never Smoker  . Smokeless tobacco: Never Used  . Alcohol use No     Allergies   No known allergies   Review of Systems Review of Systems  Skin: Positive for wound.  All other systems reviewed and are negative.    Physical Exam Updated Vital Signs BP 105/61   Pulse 97   Temp 97.6 F (36.4 C) (Oral)   Resp 11   Ht 6' (1.829 m)   Wt 180 lb (81.6 kg)   SpO2 96%   BMI 24.41 kg/m   Physical Exam  Constitutional:  Chronically ill, demented   HENT:  Head: Normocephalic.  Mouth/Throat: Oropharynx is clear and moist.  L frontal hematoma with dressing on. Small 0.5 cm laceration L lateral scalp with dry blood   Eyes: EOM are normal. Pupils are equal, round, and reactive to light.  L > R pupil (baseline anisocoria)  Neck:  C collar in place   Cardiovascular: Normal rate, regular rhythm and normal heart sounds.   Pulmonary/Chest: Effort normal and breath sounds normal.  Abdominal: Soft. Bowel sounds are normal. He exhibits no distension. There is no tenderness.  Musculoskeletal:  L elbow skin tear with no obvious laceration. Pelvis stable. No other obvious extremity trauma   Neurological: He is alert.  Demented, nl strength throughout, able to stand with assistance   Skin: Skin is warm.  Nursing note and vitals reviewed.    ED Treatments / Results  Labs (all labs ordered are listed, but only abnormal results are displayed) Labs Reviewed  CBC WITH DIFFERENTIAL/PLATELET - Abnormal; Notable for the following:       Result Value   RBC 3.95 (*)    Hemoglobin 11.7 (*)    HCT 35.0 (*)    All other components within normal limits  COMPREHENSIVE METABOLIC PANEL - Abnormal; Notable for the following:    Potassium 3.0 (*)    Chloride 98 (*)    Glucose, Bld 183 (*)    Calcium 8.6 (*)    Albumin 2.8 (*)    ALT 13 (*)    GFR calc non Af Amer 54 (*)    All other components within normal limits   PROTIME-INR - Abnormal; Notable for the following:    Prothrombin Time 16.1 (*)    All other components within normal limits  I-STAT TROPOININ, ED    EKG  EKG Interpretation  Date/Time:  Sunday August 07 2016 13:42:54 EDT Ventricular Rate:  101 PR Interval:    QRS Duration: 130 QT Interval:  391 QTC Calculation: 507 R Axis:   81 Text Interpretation:  Atrial fibrillation Right bundle branch block Baseline wander in lead(s) V5 No significant change since last tracing Confirmed by Sharlette Jansma  MD, Lynkin Saini ([id number]) on 08/07/2016 2:09:17 PM       Radiology Dg Elbow  Complete Left  Result Date: 08/07/2016 CLINICAL DATA:  Unwitnessed fall, laceration and bruising posterior left elbow EXAM: LEFT ELBOW - COMPLETE 3+ VIEW COMPARISON:  None. FINDINGS: Normal alignment without fracture or effusion. No joint abnormality. Peripheral IV noted in the left elbow. Mild soft tissue swelling. Limited lateral view but no significant finding. IMPRESSION: No acute finding by plain radiography. Electronically Signed   By: Jerilynn Mages.  Shick M.D.   On: 08/07/2016 14:25   Ct Head Wo Contrast  Result Date: 08/07/2016 CLINICAL DATA:  Unwitnessed fall, laceration to left head EXAM: CT HEAD WITHOUT CONTRAST CT CERVICAL SPINE WITHOUT CONTRAST TECHNIQUE: Multidetector CT imaging of the head and cervical spine was performed following the standard protocol without intravenous contrast. Multiplanar CT image reconstructions of the cervical spine were also generated. COMPARISON:  [facility] CT head/cervical spine earlier today at 0025 hours FINDINGS: CT HEAD FINDINGS Brain: No evidence of acute infarction, hemorrhage, hydrocephalus, extra-axial collection or mass lesion/mass effect. Global cortical atrophy. Subcortical white matter and periventricular small vessel ischemic changes. Vascular: Intracranial atherosclerosis. Skull: Normal. Negative for fracture or focal lesion. Sinuses/Orbits: The visualized paranasal sinuses are essentially clear.  The mastoid air cells are unopacified. Other: Soft tissue swelling/hematoma overlying the left frontal bone and lateral zygoma (series 4/images 14-22). CT CERVICAL SPINE FINDINGS Alignment: Normal cervical lordosis. Skull base and vertebrae: No acute fracture. No primary bone lesion or focal pathologic process. Soft tissues and spinal canal: No prevertebral fluid or swelling. No visible canal hematoma. Disc levels:  Mild to moderate multilevel degenerative changes. Spinal canal is patent. Upper chest: Visualized lung apices are clear. Other: Visualized thyroid is unremarkable. IMPRESSION: No interval change from imaging at [facility] earlier today. Soft tissue swelling/hematoma overlying the left frontal bone and lateral zygoma. No evidence of calvarial fracture. No evidence of acute intracranial abnormality. Atrophy with small vessel ischemic changes. No evidence of traumatic injury to the cervical spine. Mild to moderate degenerative changes. Electronically Signed   By: Julian Hy M.D.   On: 08/07/2016 14:53   Ct Head Wo Contrast  Result Date: 08/07/2016 CLINICAL DATA:  Unwitnessed fall, found lying on floor. EXAM: CT HEAD WITHOUT CONTRAST CT CERVICAL SPINE WITHOUT CONTRAST TECHNIQUE: Multidetector CT imaging of the head and cervical spine was performed following the standard protocol without intravenous contrast. Multiplanar CT image reconstructions of the cervical spine were also generated. COMPARISON:  12/21/2015 FINDINGS: CT HEAD FINDINGS Brain: There is no intracranial hemorrhage, mass or evidence of acute infarction. There is moderate generalized atrophy. There is moderate chronic microvascular ischemic change. There is no significant extra-axial fluid collection. No acute intracranial findings are evident. Vascular: No hyperdense vessel or unexpected calcification. Skull: Normal. Negative for fracture or focal lesion. Sinuses/Orbits: No acute finding. Other: Left frontal scalp hematoma.  CT CERVICAL SPINE FINDINGS Alignment: Normal. Skull base and vertebrae: No acute fracture. No primary bone lesion or focal pathologic process. Soft tissues and spinal canal: No prevertebral fluid or swelling. No visible canal hematoma. Disc levels: Moderate cervical degenerative disc changes. Moderate facet arthritis. Upper chest: Negative. Other: None IMPRESSION: 1. No acute intracranial findings. There is moderate generalized atrophy and chronic appearing white matter hypodensities which likely represent small vessel ischemic disease. 2. Negative for acute cervical spine fracture Electronically Signed   By: Andreas Newport M.D.   On: 08/07/2016 01:14   Ct Cervical Spine Wo Contrast  Result Date: 08/07/2016 CLINICAL DATA:  Unwitnessed fall, laceration to left head EXAM: CT HEAD WITHOUT CONTRAST CT CERVICAL SPINE WITHOUT  CONTRAST TECHNIQUE: Multidetector CT imaging of the head and cervical spine was performed following the standard protocol without intravenous contrast. Multiplanar CT image reconstructions of the cervical spine were also generated. COMPARISON:  [facility] CT head/cervical spine earlier today at 0025 hours FINDINGS: CT HEAD FINDINGS Brain: No evidence of acute infarction, hemorrhage, hydrocephalus, extra-axial collection or mass lesion/mass effect. Global cortical atrophy. Subcortical white matter and periventricular small vessel ischemic changes. Vascular: Intracranial atherosclerosis. Skull: Normal. Negative for fracture or focal lesion. Sinuses/Orbits: The visualized paranasal sinuses are essentially clear. The mastoid air cells are unopacified. Other: Soft tissue swelling/hematoma overlying the left frontal bone and lateral zygoma (series 4/images 14-22). CT CERVICAL SPINE FINDINGS Alignment: Normal cervical lordosis. Skull base and vertebrae: No acute fracture. No primary bone lesion or focal pathologic process. Soft tissues and spinal canal: No prevertebral fluid or swelling. No visible  canal hematoma. Disc levels:  Mild to moderate multilevel degenerative changes. Spinal canal is patent. Upper chest: Visualized lung apices are clear. Other: Visualized thyroid is unremarkable. IMPRESSION: No interval change from imaging at [facility] earlier today. Soft tissue swelling/hematoma overlying the left frontal bone and lateral zygoma. No evidence of calvarial fracture. No evidence of acute intracranial abnormality. Atrophy with small vessel ischemic changes. No evidence of traumatic injury to the cervical spine. Mild to moderate degenerative changes. Electronically Signed   By: Julian Hy M.D.   On: 08/07/2016 14:53   Ct Cervical Spine Wo Contrast  Result Date: 08/07/2016 CLINICAL DATA:  Unwitnessed fall, found lying on floor. EXAM: CT HEAD WITHOUT CONTRAST CT CERVICAL SPINE WITHOUT CONTRAST TECHNIQUE: Multidetector CT imaging of the head and cervical spine was performed following the standard protocol without intravenous contrast. Multiplanar CT image reconstructions of the cervical spine were also generated. COMPARISON:  12/21/2015 FINDINGS: CT HEAD FINDINGS Brain: There is no intracranial hemorrhage, mass or evidence of acute infarction. There is moderate generalized atrophy. There is moderate chronic microvascular ischemic change. There is no significant extra-axial fluid collection. No acute intracranial findings are evident. Vascular: No hyperdense vessel or unexpected calcification. Skull: Normal. Negative for fracture or focal lesion. Sinuses/Orbits: No acute finding. Other: Left frontal scalp hematoma. CT CERVICAL SPINE FINDINGS Alignment: Normal. Skull base and vertebrae: No acute fracture. No primary bone lesion or focal pathologic process. Soft tissues and spinal canal: No prevertebral fluid or swelling. No visible canal hematoma. Disc levels: Moderate cervical degenerative disc changes. Moderate facet arthritis. Upper chest: Negative. Other: None IMPRESSION: 1. No acute  intracranial findings. There is moderate generalized atrophy and chronic appearing white matter hypodensities which likely represent small vessel ischemic disease. 2. Negative for acute cervical spine fracture Electronically Signed   By: Andreas Newport M.D.   On: 08/07/2016 01:14    Procedures Procedures (including critical care time)  LACERATION REPAIR Performed by: Wandra Arthurs Authorized by: Wandra Arthurs Consent: Verbal consent obtained. Risks and benefits: risks, benefits and alternatives were discussed Consent given by: patient Patient identity confirmed: provided demographic data Prepped and Draped in normal sterile fashion Wound explored  Laceration Location: L scalp  Laceration Length: 0.5 cm  No Foreign Bodies seen or palpated  Anesthesia: none  Local anesthetic: none Irrigation method: syringe Amount of cleaning: standard  Skin closure: dermabond   Patient tolerance: Patient tolerated the procedure well with no immediate complications.   Medications Ordered in ED Medications  potassium chloride SA (K-DUR,KLOR-CON) CR tablet 40 mEq (not administered)  sodium chloride 0.9 % bolus 1,000 mL (1,000 mLs Intravenous Rate/Dose  Change 08/07/16 1512)  sodium chloride 0.9 % bolus 500 mL (0 mLs Intravenous Stopped 08/07/16 1451)     Initial Impression / Assessment and Plan / ED Course  I have reviewed the triage vital signs and the nursing notes.  Pertinent labs & imaging results that were available during my care of the patient were reviewed by me and considered in my medical decision making (see chart for details).    Joe [name] is a 83 y.o. male here with recurrent fall. I noticed that his BP is in the 90s. Patient is also orthostatic. Had CT head/neck last night that was unremarkable. Since he is on eliquis and had another head trauma, will repeat scans. Will get labs and hydrate patient.   4:12 PM Patient orthostatic initially and BP dropped to 80s. Labs  unremarkable except K 3.0, supplemented. CT head/neck unremarkable. xrays L elbow unremarkable. Given 1.5 L NS and BP now in the low 100s. I suspect orthostatic hypotension causing repeated falls. He is on multiple BP meds and will hold them for now. Stable to transfer back to nursing home.   Final Clinical Impressions(s) / ED Diagnoses   Final diagnoses:  None    New Prescriptions New Prescriptions   No medications on file     Drenda Freeze, MD 08/07/16 1615

## 2016-08-07 NOTE — ED Notes (Signed)
[facility] called to transport pt back to [facility].

## 2016-08-07 NOTE — ED Notes (Signed)
[facility] [facility] staff was notified of pt's condition and discharge.

## 2016-08-07 NOTE — Discharge Instructions (Signed)
Discuss changes to your daily medications with your primary care doctor. It may be beneficial for you to stop use of a blood thinner given your high likelihood of falling. Take tylenol for pain, as needed. You may return to the ED for any new or concerning symptoms.

## 2016-08-07 NOTE — ED Notes (Signed)
[facility] here to transport pt back to [facility]. 

## 2016-08-07 NOTE — ED Provider Notes (Signed)
Medical screening examination/treatment/procedure(s) were conducted as a shared visit with non-physician practitioner(s) and myself.  I personally evaluated the patient during the encounter.   EKG Interpretation  Date/Time:  Saturday August 06 2016 22:57:24 EDT Ventricular Rate:  104 PR Interval:    QRS Duration: 125 QT Interval:  384 QTC Calculation: 506 R Axis:   66 Text Interpretation:  Atrial fibrillation Right bundle branch block No significant change since last tracing Confirmed by Gerald Leitz ([id number]) on 08/06/2016 10:59:10 PM     83 year old male here after unwitnessed fall his prior to arrival. Has a contusion to his left forehead. Head and neck CT without acute findings. We'll Be discharged back to this facility   Lacretia Leigh, MD 08/07/16 0126

## 2016-08-07 NOTE — ED Notes (Signed)
Report called to [facility]. [facility] states" the RN is not currently at facility" Informed discharge instructions and that instructions will be sent with with patient.

## 2016-08-09 DIAGNOSIS — R531 Weakness: Secondary | ICD-10-CM | POA: Diagnosis not present

## 2016-08-09 DIAGNOSIS — E119 Type 2 diabetes mellitus without complications: Secondary | ICD-10-CM | POA: Diagnosis not present

## 2016-08-09 DIAGNOSIS — R600 Localized edema: Secondary | ICD-10-CM | POA: Diagnosis not present

## 2016-08-09 DIAGNOSIS — N400 Enlarged prostate without lower urinary tract symptoms: Secondary | ICD-10-CM | POA: Diagnosis not present

## 2016-08-09 DIAGNOSIS — R296 Repeated falls: Secondary | ICD-10-CM | POA: Diagnosis not present

## 2016-08-09 DIAGNOSIS — I4891 Unspecified atrial fibrillation: Secondary | ICD-10-CM | POA: Diagnosis not present

## 2016-08-09 DIAGNOSIS — Z7984 Long term (current) use of oral hypoglycemic drugs: Secondary | ICD-10-CM | POA: Diagnosis not present

## 2016-08-09 DIAGNOSIS — E039 Hypothyroidism, unspecified: Secondary | ICD-10-CM | POA: Diagnosis not present

## 2016-08-09 DIAGNOSIS — R269 Unspecified abnormalities of gait and mobility: Secondary | ICD-10-CM | POA: Diagnosis not present

## 2016-08-11 DIAGNOSIS — Z7984 Long term (current) use of oral hypoglycemic drugs: Secondary | ICD-10-CM | POA: Diagnosis not present

## 2016-08-11 DIAGNOSIS — R600 Localized edema: Secondary | ICD-10-CM | POA: Diagnosis not present

## 2016-08-11 DIAGNOSIS — R296 Repeated falls: Secondary | ICD-10-CM | POA: Diagnosis not present

## 2016-08-11 DIAGNOSIS — E039 Hypothyroidism, unspecified: Secondary | ICD-10-CM | POA: Diagnosis not present

## 2016-08-11 DIAGNOSIS — E119 Type 2 diabetes mellitus without complications: Secondary | ICD-10-CM | POA: Diagnosis not present

## 2016-08-11 DIAGNOSIS — R269 Unspecified abnormalities of gait and mobility: Secondary | ICD-10-CM | POA: Diagnosis not present

## 2016-08-11 DIAGNOSIS — N400 Enlarged prostate without lower urinary tract symptoms: Secondary | ICD-10-CM | POA: Diagnosis not present

## 2016-08-11 DIAGNOSIS — I10 Essential (primary) hypertension: Secondary | ICD-10-CM | POA: Diagnosis not present

## 2016-08-11 DIAGNOSIS — R531 Weakness: Secondary | ICD-10-CM | POA: Diagnosis not present

## 2016-08-11 DIAGNOSIS — I4891 Unspecified atrial fibrillation: Secondary | ICD-10-CM | POA: Diagnosis not present

## 2016-08-15 ENCOUNTER — Emergency Department (HOSPITAL_COMMUNITY): Payer: Medicare Other

## 2016-08-15 ENCOUNTER — Emergency Department (HOSPITAL_COMMUNITY)
Admission: EM | Admit: 2016-08-15 | Discharge: 2016-08-16 | Disposition: A | Discharge status: Home / Self Care | Payer: Medicare Other | Attending: Emergency Medicine | Admitting: Emergency Medicine

## 2016-08-15 ENCOUNTER — Encounter (HOSPITAL_COMMUNITY): Payer: Self-pay

## 2016-08-15 DIAGNOSIS — S0083XA Contusion of other part of head, initial encounter: Secondary | ICD-10-CM | POA: Insufficient documentation

## 2016-08-15 DIAGNOSIS — Z7901 Long term (current) use of anticoagulants: Secondary | ICD-10-CM | POA: Diagnosis not present

## 2016-08-15 DIAGNOSIS — S199XXA Unspecified injury of neck, initial encounter: Secondary | ICD-10-CM | POA: Diagnosis not present

## 2016-08-15 DIAGNOSIS — Z79899 Other long term (current) drug therapy: Secondary | ICD-10-CM | POA: Diagnosis not present

## 2016-08-15 DIAGNOSIS — E039 Hypothyroidism, unspecified: Secondary | ICD-10-CM | POA: Diagnosis not present

## 2016-08-15 DIAGNOSIS — Y92129 Unspecified place in nursing home as the place of occurrence of the external cause: Secondary | ICD-10-CM | POA: Diagnosis not present

## 2016-08-15 DIAGNOSIS — S299XXA Unspecified injury of thorax, initial encounter: Secondary | ICD-10-CM | POA: Diagnosis not present

## 2016-08-15 DIAGNOSIS — E119 Type 2 diabetes mellitus without complications: Secondary | ICD-10-CM | POA: Diagnosis not present

## 2016-08-15 DIAGNOSIS — I10 Essential (primary) hypertension: Secondary | ICD-10-CM | POA: Diagnosis not present

## 2016-08-15 DIAGNOSIS — Y999 Unspecified external cause status: Secondary | ICD-10-CM | POA: Insufficient documentation

## 2016-08-15 DIAGNOSIS — S0990XA Unspecified injury of head, initial encounter: Secondary | ICD-10-CM | POA: Diagnosis not present

## 2016-08-15 DIAGNOSIS — W1839XA Other fall on same level, initial encounter: Secondary | ICD-10-CM | POA: Diagnosis not present

## 2016-08-15 DIAGNOSIS — W19XXXA Unspecified fall, initial encounter: Secondary | ICD-10-CM

## 2016-08-15 DIAGNOSIS — M542 Cervicalgia: Secondary | ICD-10-CM | POA: Diagnosis not present

## 2016-08-15 DIAGNOSIS — R9431 Abnormal electrocardiogram [ECG] [EKG]: Secondary | ICD-10-CM | POA: Diagnosis not present

## 2016-08-15 DIAGNOSIS — N3001 Acute cystitis with hematuria: Secondary | ICD-10-CM | POA: Diagnosis not present

## 2016-08-15 DIAGNOSIS — Y9389 Activity, other specified: Secondary | ICD-10-CM | POA: Diagnosis not present

## 2016-08-15 DIAGNOSIS — R451 Restlessness and agitation: Secondary | ICD-10-CM | POA: Diagnosis not present

## 2016-08-15 DIAGNOSIS — S0003XA Contusion of scalp, initial encounter: Secondary | ICD-10-CM | POA: Diagnosis not present

## 2016-08-15 LAB — COMPREHENSIVE METABOLIC PANEL
ALBUMIN: 2.8 g/dL — AB (ref 3.5–5.0)
ALT: 14 U/L — ABNORMAL LOW (ref 17–63)
ANION GAP: 11 (ref 5–15)
AST: 23 U/L (ref 15–41)
Alkaline Phosphatase: 127 U/L — ABNORMAL HIGH (ref 38–126)
BUN: 10 mg/dL (ref 6–20)
CO2: 29 mmol/L (ref 22–32)
Calcium: 8.6 mg/dL — ABNORMAL LOW (ref 8.9–10.3)
Chloride: 94 mmol/L — ABNORMAL LOW (ref 101–111)
Creatinine, Ser: 1.11 mg/dL (ref 0.61–1.24)
GFR calc non Af Amer: 57 mL/min — ABNORMAL LOW (ref >60)
GLUCOSE: 244 mg/dL — AB (ref 65–99)
POTASSIUM: 3.5 mmol/L (ref 3.5–5.1)
SODIUM: 134 mmol/L — AB (ref 135–145)
Total Bilirubin: 0.6 mg/dL (ref 0.3–1.2)
Total Protein: 6.4 g/dL — ABNORMAL LOW (ref 6.5–8.1)

## 2016-08-15 LAB — CBC WITH DIFFERENTIAL/PLATELET
BASOS PCT: 0 %
Basophils Absolute: 0 10*3/uL (ref 0.0–0.1)
Eosinophils Absolute: 0.1 10*3/uL (ref 0.0–0.7)
Eosinophils Relative: 1 %
HEMATOCRIT: 35.6 % — AB (ref 39.0–52.0)
HEMOGLOBIN: 12.2 g/dL — AB (ref 13.0–17.0)
LYMPHS ABS: 2.3 10*3/uL (ref 0.7–4.0)
Lymphocytes Relative: 18 %
MCH: 30.1 pg (ref 26.0–34.0)
MCHC: 34.3 g/dL (ref 30.0–36.0)
MCV: 87.9 fL (ref 78.0–100.0)
MONOS PCT: 8 %
Monocytes Absolute: 1 10*3/uL (ref 0.1–1.0)
NEUTROS ABS: 9.2 10*3/uL — AB (ref 1.7–7.7)
NEUTROS PCT: 73 %
Platelets: 365 10*3/uL (ref 150–400)
RBC: 4.05 MIL/uL — AB (ref 4.22–5.81)
RDW: 14.5 % (ref 11.5–15.5)
WBC: 12.6 10*3/uL — AB (ref 4.0–10.5)

## 2016-08-15 LAB — ETHANOL: Alcohol, Ethyl (B): <5 mg/dL (ref <5)

## 2016-08-15 MED ORDER — LORAZEPAM 2 MG/ML IJ SOLN
0.5000 mg | Freq: Once | INTRAMUSCULAR | Status: AC
  Administered 2016-08-15: 0.5 mg via INTRAMUSCULAR

## 2016-08-15 MED ORDER — LORAZEPAM 2 MG/ML IJ SOLN
1.0000 mg | Freq: Once | INTRAMUSCULAR | Status: AC
  Administered 2016-08-15: 1 mg via INTRAMUSCULAR
  Filled 2016-08-15: qty 1

## 2016-08-15 MED ORDER — LORAZEPAM 2 MG/ML IJ SOLN
0.5000 mg | Freq: Once | INTRAMUSCULAR | Status: DC
  Filled 2016-08-15: qty 1

## 2016-08-15 NOTE — ED Notes (Signed)
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

## 2016-08-15 NOTE — ED Notes (Signed)
Pt is refusing all procedures, becoming verbally aggressive (yelling profanity) and physically aggressive (swinging fists and grabbing at staff). Provider bedside ordered 1mg  Ativan which was administered.  Will continue to monitor

## 2016-08-15 NOTE — ED Provider Notes (Signed)
[facility] DEPT Provider Note   CSN: [id number] Arrival date & time: 08/15/16  1553     History   Chief Complaint Chief Complaint  Patient presents with  . Fall    HPI Joe [name] is a 83 y.o. male.  HPI   Level V caveat due to previously documented cognitive deficits.  Joe [name] is a 83 y.o. male, with a history of HTN, A. fib, hypothyroidism, cognitive deficits, and DM, presenting to the ED with a reported fall from standing.  Patient denies any complaints. He is anticoagulated on Eliquis.    Past Medical History:  Diagnosis Date  . Anxiety   . Atrial fibrillation with RVR ([place]) 08/28/2014   [name] 08/28/2014  . BPH (benign prostatic hypertrophy)    [name] 08/28/2014  . Hypertension    [name] 08/28/2014  . Hypothyroidism    [name] 08/28/2014  . Thyroid disease     Patient Active Problem List   Diagnosis Date Noted  . Adjustment disorder with mixed anxiety and depressed mood 11/13/2014  . Cognitive deficits 11/13/2014  . Syncope and collapse 11/10/2014  . Orthostatic hypotension 11/10/2014  . Hypothyroidism 08/28/2014  . Essential hypertension 08/28/2014  . Atrial fibrillation, new onset ([place]) 08/28/2014  . Dizziness 08/28/2014  . BPH (benign prostatic hypertrophy) 08/28/2014  . Diabetes mellitus with no complication ([place]) 62/26/3335  . Risky sexual behavior 08/28/2014  . A-fib ([place]) 08/28/2014  . Hypothyroid 09/10/2013  . BPH (benign prostatic hyperplasia) 09/10/2013    Past Surgical History:  Procedure Laterality Date  . PARS PLANA VITRECTOMY Left 03/30/2013   Procedure: PARS PLANA VITRECTOMY 25 GAUGE FOR ENDOPHTHALMITIS;  Surgeon: Hurman Horn, MD;  Location: [facility];  Service: Ophthalmology;  Laterality: Left;  LOCAL RETROBULBAR,,  USE MARCAINE 0.5%       Home Medications    Prior to Admission medications   Medication Sig Start Date End Date Taking? Authorizing Provider  Alogliptin-Pioglitazone (OSENI) 25-15 MG TABS Take 1 tablet  by mouth daily.    Historical Provider, MD  apixaban (ELIQUIS) 5 MG TABS tablet Take 1 tablet (5 mg total) by mouth 2 (two) times daily. 12/27/14   Robyn Haber, MD  cephALEXin (KEFLEX) 500 MG capsule Take 1 capsule (500 mg total) by mouth 4 (four) times daily. 08/16/16   Shawn C Joy, PA-C  citalopram (CELEXA) 20 MG tablet Take 1 tablet (20 mg total) by mouth daily. Patient not taking: Reported on 08/06/2016 01/22/16   Wendie Agreste, MD  levothyroxine (SYNTHROID, LEVOTHROID) 100 MCG tablet Take 100 mcg by mouth daily before breakfast.    Historical Provider, MD  levothyroxine (SYNTHROID, LEVOTHROID) 125 MCG tablet Take 1 tablet (125 mcg total) by mouth daily before breakfast. 12/28/14   Robyn Haber, MD  LORazepam (ATIVAN) 0.5 MG tablet Take 1 tablet (0.5 mg total) by mouth 2 (two) times daily as needed for anxiety. Patient taking differently: Take 0.5 mg by mouth 3 (three) times daily. May also take twice daily as needed for anxiety 07/28/16   Mancel Bale, PA-C  metoprolol tartrate (LOPRESSOR) 25 MG tablet Take 25 mg by mouth daily.    Historical Provider, MD  pioglitazone (ACTOS) 15 MG tablet Take 15 mg by mouth daily.    Historical Provider, MD  ramipril (ALTACE) 1.25 MG capsule Take 1.25 mg by mouth daily.    Historical Provider, MD  ramipril (ALTACE) 5 MG capsule Take 1 capsule (5 mg total) by mouth daily. 01/22/16   Wendie Agreste, MD  risperiDONE (RISPERDAL) 0.5 MG tablet Take 0.5 mg by mouth 2 (two) times daily.    Historical Provider, MD  tamsulosin (FLOMAX) 0.4 MG CAPS capsule Take 0.4 mg by mouth daily.    Historical Provider, MD  torsemide (DEMADEX) 20 MG tablet Take 20 mg by mouth daily.    Historical Provider, MD    Family History Family History  Problem Relation Age of Onset  . Hyperlipidemia Sister     Social History Social History  Substance Use Topics  . Smoking status: Never Smoker  . Smokeless tobacco: Never Used  . Alcohol use No     Allergies   No known  allergies   Review of Systems Review of Systems  Unable to perform ROS: Dementia     Physical Exam Updated Vital Signs BP 131/72 (BP Location: Right Arm)   Pulse (!) 108   Temp 97.3 F (36.3 C) (Oral)   Resp 20   Ht 6' (1.829 m)   Wt 81.6 kg   SpO2 98%   BMI 24.41 kg/m   Physical Exam  Constitutional: He appears well-developed and well-nourished. No distress.  HENT:  Head: Normocephalic.  Bruising to the left lateral orbit and frontal region that appears to having a healing appearance. No noted underlying instability, crepitus, or deformity.  Eyes: Conjunctivae and EOM are normal.  Right pupil: 69mm, Left pupil: 2-3 mm, this abnormality was previously documented on 08/07/16 as baseline anisocoria.  Neck: Normal range of motion. Neck supple.  Cardiovascular: Regular rhythm, normal heart sounds and intact distal pulses.  Tachycardia present.   It should be noted that the patient was tachycardic while at rest.  Pulmonary/Chest: Effort normal and breath sounds normal. No respiratory distress.  Equal chest expansion bilaterally.  Abdominal: Soft. There is no tenderness. There is no guarding.  Musculoskeletal: He exhibits no edema.  Spine palpated without noted tenderness, deformity, crepitus, or step off.  Lymphadenopathy:    He has no cervical adenopathy.  Neurological: He is alert.  Self: [name] Situation: "I don't know why they brought me here. I just tripped and fell." Date: December 01, 1961 Place: [place], [place] No sensory deficits. Strength 5/5 in all extremities. Gait is slow and stuttering. Finger to nose coordination intact. Cranial nerves III-XII grossly intact. No facial droop.     Skin: Skin is warm and dry. He is not diaphoretic.  Psychiatric: He is agitated (intermittent) and combative (intermittent).  Nursing note and vitals reviewed.    ED Treatments / Results  Labs (all labs ordered are listed, but only abnormal results are displayed) Labs Reviewed    COMPREHENSIVE METABOLIC PANEL - Abnormal; Notable for the following:       Result Value   Sodium 134 (*)    Chloride 94 (*)    Glucose, Bld 244 (*)    Calcium 8.6 (*)    Total Protein 6.4 (*)    Albumin 2.8 (*)    ALT 14 (*)    Alkaline Phosphatase 127 (*)    GFR calc non Af Amer 57 (*)    All other components within normal limits  URINALYSIS, ROUTINE W REFLEX MICROSCOPIC - Abnormal; Notable for the following:    APPearance HAZY (*)    Glucose, UA >=500 (*)    Hgb urine dipstick MODERATE (*)    Leukocytes, UA LARGE (*)    Squamous Epithelial / LPF 0-5 (*)    All other components within normal limits  CBC WITH DIFFERENTIAL/PLATELET - Abnormal; Notable for the  following:    WBC 12.6 (*)    RBC 4.05 (*)    Hemoglobin 12.2 (*)    HCT 35.6 (*)    Neutro Abs 9.2 (*)    All other components within normal limits  URINE CULTURE  ETHANOL  RAPID URINE DRUG SCREEN, HOSP PERFORMED    EKG  EKG Interpretation None       Radiology Dg Chest 2 View  Result Date: 08/15/2016 CLINICAL DATA:  [place] from standing position.  Tachycardia. EXAM: CHEST  2 VIEW COMPARISON:  12/27/2014 FINDINGS: Shallow inspiration with linear atelectasis or scarring in the left lung base, similar to prior study. No developing consolidation or airspace disease. Blunting of the left costophrenic angle is unchanged since prior study, likely representing pleural thickening. No pneumothorax. Normal heart size and pulmonary vascularity. Calcified aorta. Old bilateral rib fractures. Degenerative changes in the spine and shoulders. IMPRESSION: Left pleural thickening with linear atelectasis or scarring in the left lung base. No change since prior study. No evidence of active pulmonary disease. Electronically Signed   By: Lucienne Capers M.D.   On: 08/15/2016 23:18   Ct Head Wo Contrast  Result Date: 08/15/2016 CLINICAL DATA:  Patient fell at nursing home. EXAM: CT HEAD WITHOUT CONTRAST CT CERVICAL SPINE WITHOUT CONTRAST  TECHNIQUE: Multidetector CT imaging of the head and cervical spine was performed following the standard protocol without intravenous contrast. Multiplanar CT image reconstructions of the cervical spine were also generated. COMPARISON:  08/07/2016 FINDINGS: CT HEAD FINDINGS Brain: No evidence of acute infarction, hemorrhage, hydrocephalus, extra-axial collection or mass lesion/mass effect. Diffuse cerebral atrophy. Ventricular dilatation consistent with central atrophy. Low-attenuation changes in the deep white matter consistent with small vessel ischemia. Vascular: Vascular calcifications are present. Skull: Calvarium appears intact. Sinuses/Orbits: Mild mucosal thickening in the maxillary antra. No acute air-fluid levels in the paranasal sinuses. Mastoid air cells are not opacified. Other: Focal subcutaneous scalp hematoma versus calcified soft tissue nodule demonstrated over the left anterior frontal region. This was present on previous study. CT CERVICAL SPINE FINDINGS Alignment: Alignment is unchanged since previous study. Again, there is slight anterior subluxation at C6-7. This is likely degenerative. Normal alignment of the facet joints. Skull base and vertebrae: No acute fracture. No primary bone lesion or focal pathologic process. Soft tissues and spinal canal: No prevertebral fluid or swelling. No visible canal hematoma. Disc levels: Diffuse degenerative change throughout the cervical spine with narrowed interspaces and endplate hypertrophic changes. Degenerative changes throughout the facet joints. Upper chest: No acute abnormalities. Other: Vascular calcifications in the cervical carotid arteries. IMPRESSION: CT head: No acute intracranial abnormalities. Chronic atrophy and small vessel ischemic changes. CT cervical spine: Alignment is unchanged since previous studies. Diffuse degenerative change. No acute displaced fractures identified. Electronically Signed   By: Lucienne Capers M.D.   On: 08/15/2016  23:29   Ct Cervical Spine Wo Contrast  Result Date: 08/15/2016 CLINICAL DATA:  Patient fell at nursing home. EXAM: CT HEAD WITHOUT CONTRAST CT CERVICAL SPINE WITHOUT CONTRAST TECHNIQUE: Multidetector CT imaging of the head and cervical spine was performed following the standard protocol without intravenous contrast. Multiplanar CT image reconstructions of the cervical spine were also generated. COMPARISON:  08/07/2016 FINDINGS: CT HEAD FINDINGS Brain: No evidence of acute infarction, hemorrhage, hydrocephalus, extra-axial collection or mass lesion/mass effect. Diffuse cerebral atrophy. Ventricular dilatation consistent with central atrophy. Low-attenuation changes in the deep white matter consistent with small vessel ischemia. Vascular: Vascular calcifications are present. Skull: Calvarium appears intact. Sinuses/Orbits: Mild mucosal thickening in  the maxillary antra. No acute air-fluid levels in the paranasal sinuses. Mastoid air cells are not opacified. Other: Focal subcutaneous scalp hematoma versus calcified soft tissue nodule demonstrated over the left anterior frontal region. This was present on previous study. CT CERVICAL SPINE FINDINGS Alignment: Alignment is unchanged since previous study. Again, there is slight anterior subluxation at C6-7. This is likely degenerative. Normal alignment of the facet joints. Skull base and vertebrae: No acute fracture. No primary bone lesion or focal pathologic process. Soft tissues and spinal canal: No prevertebral fluid or swelling. No visible canal hematoma. Disc levels: Diffuse degenerative change throughout the cervical spine with narrowed interspaces and endplate hypertrophic changes. Degenerative changes throughout the facet joints. Upper chest: No acute abnormalities. Other: Vascular calcifications in the cervical carotid arteries. IMPRESSION: CT head: No acute intracranial abnormalities. Chronic atrophy and small vessel ischemic changes. CT cervical spine:  Alignment is unchanged since previous studies. Diffuse degenerative change. No acute displaced fractures identified. Electronically Signed   By: Lucienne Capers M.D.   On: 08/15/2016 23:29    Procedures Procedures (including critical care time)  Medications Ordered in ED Medications  LORazepam (ATIVAN) injection 0.5 mg (0.5 mg Intramuscular Given 08/15/16 1807)  LORazepam (ATIVAN) injection 1 mg (1 mg Intramuscular Given 08/15/16 1940)  LORazepam (ATIVAN) injection 1 mg (1 mg Intramuscular Given 08/15/16 2011)     Initial Impression / Assessment and Plan / ED Course  I have reviewed the triage vital signs and the nursing notes.  Pertinent labs & imaging results that were available during my care of the patient were reviewed by me and considered in my medical decision making (see chart for details).  Clinical Course as of Aug 17 119  Mon Aug 15, 2016  1905 Spoke with patient's sister and POA, [name], and patient's niece, Kennyth Lose. They were filled in on the patient's HPI and vital sign findings. The options for care were presented to them. They state they would like [place] to perform the CT of the head and neck, xrays, and labs, even if we need to sedate the patient.  [SJ]  Tue Aug 16, 2016  0110 Patient began to become agitated again before Rocephin and IV fluids could be administered. Patient noted to have a resting HR of 91.  [SJ]    Clinical Course User Index [SJ] Lorayne Bender, PA-C    Patient presents following a fall. Patient's tachycardia at rest along with his fall warrants further investigation. Patient exhibited agitation with attempts at initiating any procedures. Options for sedation were initially limited due to previous EKG on 4/8 showing prolonged QTc at 508. Delays in patient's imaging and labs due to patient's agitation. Patient would repeatedly try to get out of bed and wander the halls. He required a sitter for safety. He could, at times, be verbally redirected. Patient became  compliant after light sedation, during which time he was calm, but still easily arousable.  Patient noted to have recovery of his tachycardia with oral fluids. Patient discharged with care instructions, ABX prescription, and return precautions. Patient evaluated prior to discharge and noted to have a mental status evaluation similar to his results when he arrived.  Findings and plan of care discussed with Brantley Stage, MD. Dr. Oleta Mouse personally evaluated and examined this patient.  Vitals:   08/15/16 1600 08/15/16 1601 08/15/16 1615 08/15/16 1630  BP: 127/84  126/78 (!) 133/104  Pulse:   (!) 110 (!) 122  Resp:      Temp:  TempSrc:      SpO2:   95% 91%  Weight:  81.6 kg    Height:  6' (1.829 m)     Vitals:   08/15/16 2351 08/16/16 0000 08/16/16 0015 08/16/16 0100  BP:  123/90 123/90 (!) 115/101  Pulse:  (!) 106 91   Resp:  (!) 22 16 16   Temp:      TempSrc:      SpO2: 98% 96% 98%   Weight:      Height:          Final Clinical Impressions(s) / ED Diagnoses   Final diagnoses:  Fall, initial encounter  Acute cystitis with hematuria    New Prescriptions New Prescriptions   CEPHALEXIN (KEFLEX) 500 MG CAPSULE    Take 1 capsule (500 mg total) by mouth 4 (four) times daily.     Lorayne Bender, PA-C 08/16/16 Orion Liu, MD 08/16/16 [phone]

## 2016-08-15 NOTE — ED Notes (Signed)
Delay in lab draw,  Pt not cooperating

## 2016-08-15 NOTE — ED Notes (Signed)
Pt states "I want to go home, Tell that man to bring the ambulance and lets go."

## 2016-08-15 NOTE — ED Triage Notes (Signed)
Pt arrives EMS from nursing hoime where he fell from standing. Pt denioes loc and arrives with towel roll inplace that he removes. Pt standing at bedside and persuyaded to return to stretcher.

## 2016-08-15 NOTE — ED Notes (Signed)
Pt refusing to go to xray. States"I don't want a damn xray, I never wanted to come here.Marland Kitchen"

## 2016-08-15 NOTE — ED Notes (Signed)
Pt refused to have vitals taken as well as being connected to the monitor. Pt is yelling that he is

## 2016-08-15 NOTE — ED Notes (Signed)
Pt refuses to have vital signs taken

## 2016-08-15 NOTE — ED Notes (Signed)
Pt refuses vial signs, spitting while talking.

## 2016-08-15 NOTE — ED Notes (Signed)
Pt again walking in hallway and yelling that he wants to go home.MD aware that he refuses services.

## 2016-08-15 NOTE — ED Notes (Signed)
Message left on sisters phone to return call at patients request.

## 2016-08-15 NOTE — ED Notes (Addendum)
Pt continues to be aggressive, Provider bedside, ordered additional ativan which was given.  Will continue to monitor

## 2016-08-16 ENCOUNTER — Encounter (HOSPITAL_COMMUNITY): Payer: Self-pay

## 2016-08-16 ENCOUNTER — Emergency Department (HOSPITAL_COMMUNITY)
Admission: EM | Admit: 2016-08-16 | Discharge: 2016-08-16 | Disposition: A | Discharge status: Home / Self Care | Payer: Medicare Other | Source: Home / Self Care | Attending: Emergency Medicine | Admitting: Emergency Medicine

## 2016-08-16 ENCOUNTER — Emergency Department (HOSPITAL_COMMUNITY): Payer: Medicare Other

## 2016-08-16 DIAGNOSIS — W19XXXA Unspecified fall, initial encounter: Secondary | ICD-10-CM

## 2016-08-16 DIAGNOSIS — S0003XA Contusion of scalp, initial encounter: Secondary | ICD-10-CM | POA: Diagnosis not present

## 2016-08-16 DIAGNOSIS — W1839XA Other fall on same level, initial encounter: Secondary | ICD-10-CM

## 2016-08-16 DIAGNOSIS — E039 Hypothyroidism, unspecified: Secondary | ICD-10-CM

## 2016-08-16 DIAGNOSIS — E119 Type 2 diabetes mellitus without complications: Secondary | ICD-10-CM | POA: Insufficient documentation

## 2016-08-16 DIAGNOSIS — Z7984 Long term (current) use of oral hypoglycemic drugs: Secondary | ICD-10-CM | POA: Insufficient documentation

## 2016-08-16 DIAGNOSIS — R9431 Abnormal electrocardiogram [ECG] [EKG]: Secondary | ICD-10-CM | POA: Diagnosis not present

## 2016-08-16 DIAGNOSIS — S0093XA Contusion of unspecified part of head, initial encounter: Secondary | ICD-10-CM

## 2016-08-16 DIAGNOSIS — Y999 Unspecified external cause status: Secondary | ICD-10-CM

## 2016-08-16 DIAGNOSIS — Z79899 Other long term (current) drug therapy: Secondary | ICD-10-CM

## 2016-08-16 DIAGNOSIS — Z7901 Long term (current) use of anticoagulants: Secondary | ICD-10-CM

## 2016-08-16 DIAGNOSIS — M542 Cervicalgia: Secondary | ICD-10-CM | POA: Diagnosis not present

## 2016-08-16 DIAGNOSIS — R55 Syncope and collapse: Secondary | ICD-10-CM | POA: Diagnosis not present

## 2016-08-16 DIAGNOSIS — S199XXA Unspecified injury of neck, initial encounter: Secondary | ICD-10-CM | POA: Diagnosis not present

## 2016-08-16 DIAGNOSIS — I10 Essential (primary) hypertension: Secondary | ICD-10-CM

## 2016-08-16 DIAGNOSIS — Y9389 Activity, other specified: Secondary | ICD-10-CM

## 2016-08-16 DIAGNOSIS — Y92129 Unspecified place in nursing home as the place of occurrence of the external cause: Secondary | ICD-10-CM

## 2016-08-16 DIAGNOSIS — R030 Elevated blood-pressure reading, without diagnosis of hypertension: Secondary | ICD-10-CM | POA: Diagnosis not present

## 2016-08-16 LAB — URINALYSIS, ROUTINE W REFLEX MICROSCOPIC
Bacteria, UA: NONE SEEN
Bilirubin Urine: NEGATIVE
Ketones, ur: NEGATIVE mg/dL
NITRITE: NEGATIVE
PROTEIN: NEGATIVE mg/dL
SPECIFIC GRAVITY, URINE: 1.015 (ref 1.005–1.030)
pH: 5 (ref 5.0–8.0)

## 2016-08-16 LAB — RAPID URINE DRUG SCREEN, HOSP PERFORMED
AMPHETAMINES: NOT DETECTED
BENZODIAZEPINES: NOT DETECTED
Barbiturates: NOT DETECTED
Cocaine: NOT DETECTED
Opiates: NOT DETECTED
TETRAHYDROCANNABINOL: NOT DETECTED

## 2016-08-16 LAB — CBG MONITORING, ED: Glucose-Capillary: 193 mg/dL — ABNORMAL HIGH (ref 65–99)

## 2016-08-16 MED ORDER — DEXTROSE 5 % IV SOLN
1.0000 g | Freq: Once | INTRAVENOUS | Status: DC
  Filled 2016-08-16: qty 10

## 2016-08-16 MED ORDER — SODIUM CHLORIDE 0.9 % IV BOLUS (SEPSIS): 1000.0000 mL | Freq: Once | INTRAVENOUS | Status: DC

## 2016-08-16 MED ORDER — CEPHALEXIN 500 MG PO CAPS: 500.0000 mg | ORAL_CAPSULE | Freq: Four times a day (QID) | ORAL | 0 refills | Status: DC

## 2016-08-16 MED ORDER — LORAZEPAM 2 MG/ML IJ SOLN
1.0000 mg | Freq: Once | INTRAMUSCULAR | Status: DC
  Filled 2016-08-16: qty 1

## 2016-08-16 NOTE — ED Notes (Signed)
Called report to Anderson Malta, nurse at [facility].  [phone]

## 2016-08-16 NOTE — Discharge Instructions (Signed)
Your head and neck CT did not show any new changes.

## 2016-08-16 NOTE — ED Notes (Signed)
[facility] contacted to return patient to [facility]

## 2016-08-16 NOTE — ED Notes (Signed)
PA aware Ativan is being held.  Pt sleeping, hard to arouse, pt opens eyes, answers several questions and returns to sleep quickly.

## 2016-08-16 NOTE — ED Notes (Signed)
Waiting on [facility] to transport pt back to facility

## 2016-08-16 NOTE — ED Notes (Signed)
[facility] called for transport.  

## 2016-08-16 NOTE — ED Provider Notes (Signed)
[facility] DEPT Provider Note   CSN: [id number] Arrival date & time: 08/16/16  1215     History   Chief Complaint Chief Complaint  Patient presents with  . Fall    HPI Joe [name] is a 83 y.o. male.  HPI   He presents today from his nursing home with concern for increased altered mental status and fall. Patient is baseline altered but nursing home reports he is more altered and that he had an unwitnessed fall this morning after being in the ED last night for a different fall.  Patient is on eliquis.  Patient has a history of dementia.  He is unable to provide further HPI due to dementia.  Past Medical History:  Diagnosis Date  . Anxiety   . Atrial fibrillation with RVR ([place]) 08/28/2014   [name] 08/28/2014  . BPH (benign prostatic hypertrophy)    [name] 08/28/2014  . Hypertension    [name] 08/28/2014  . Hypothyroidism    [name] 08/28/2014  . Thyroid disease     Patient Active Problem List   Diagnosis Date Noted  . Adjustment disorder with mixed anxiety and depressed mood 11/13/2014  . Cognitive deficits 11/13/2014  . Syncope and collapse 11/10/2014  . Orthostatic hypotension 11/10/2014  . Hypothyroidism 08/28/2014  . Essential hypertension 08/28/2014  . Atrial fibrillation, new onset ([place]) 08/28/2014  . Dizziness 08/28/2014  . BPH (benign prostatic hypertrophy) 08/28/2014  . Diabetes mellitus with no complication ([place]) 27/25/3664  . Risky sexual behavior 08/28/2014  . A-fib ([place]) 08/28/2014  . Hypothyroid 09/10/2013  . BPH (benign prostatic hyperplasia) 09/10/2013    Past Surgical History:  Procedure Laterality Date  . PARS PLANA VITRECTOMY Left 03/30/2013   Procedure: PARS PLANA VITRECTOMY 25 GAUGE FOR ENDOPHTHALMITIS;  Surgeon: Hurman Horn, MD;  Location: [facility];  Service: Ophthalmology;  Laterality: Left;  LOCAL RETROBULBAR,,  USE MARCAINE 0.5%       Home Medications    Prior to Admission medications   Medication Sig Start Date End Date Taking?  Authorizing Provider  Alogliptin-Pioglitazone (OSENI) 25-15 MG TABS Take 1 tablet by mouth daily.    Historical Provider, MD  apixaban (ELIQUIS) 5 MG TABS tablet Take 1 tablet (5 mg total) by mouth 2 (two) times daily. 12/27/14   Robyn Haber, MD  cephALEXin (KEFLEX) 500 MG capsule Take 1 capsule (500 mg total) by mouth 4 (four) times daily. 08/16/16   Shawn C Joy, PA-C  citalopram (CELEXA) 20 MG tablet Take 1 tablet (20 mg total) by mouth daily. Patient not taking: Reported on 08/06/2016 01/22/16   Wendie Agreste, MD  levothyroxine (SYNTHROID, LEVOTHROID) 100 MCG tablet Take 100 mcg by mouth daily before breakfast.    Historical Provider, MD  levothyroxine (SYNTHROID, LEVOTHROID) 125 MCG tablet Take 1 tablet (125 mcg total) by mouth daily before breakfast. 12/28/14   Robyn Haber, MD  LORazepam (ATIVAN) 0.5 MG tablet Take 1 tablet (0.5 mg total) by mouth 2 (two) times daily as needed for anxiety. Patient taking differently: Take 0.5 mg by mouth 3 (three) times daily. May also take twice daily as needed for anxiety 07/28/16   Mancel Bale, PA-C  metoprolol tartrate (LOPRESSOR) 25 MG tablet Take 25 mg by mouth daily.    Historical Provider, MD  pioglitazone (ACTOS) 15 MG tablet Take 15 mg by mouth daily.    Historical Provider, MD  ramipril (ALTACE) 1.25 MG capsule Take 1.25 mg by mouth daily.    Historical Provider, MD  ramipril (ALTACE) 5  MG capsule Take 1 capsule (5 mg total) by mouth daily. 01/22/16   Wendie Agreste, MD  risperiDONE (RISPERDAL) 0.5 MG tablet Take 0.5 mg by mouth 2 (two) times daily.    Historical Provider, MD  tamsulosin (FLOMAX) 0.4 MG CAPS capsule Take 0.4 mg by mouth daily.    Historical Provider, MD  torsemide (DEMADEX) 20 MG tablet Take 20 mg by mouth daily.    Historical Provider, MD    Family History Family History  Problem Relation Age of Onset  . Hyperlipidemia Sister     Social History Social History  Substance Use Topics  . Smoking status: Never Smoker    . Smokeless tobacco: Never Used  . Alcohol use No     Allergies   No known allergies   Review of Systems Review of Systems  Unable to perform ROS: Dementia  Cardiovascular: Negative for chest pain.  Musculoskeletal: Positive for neck pain.     Physical Exam Updated Vital Signs BP 102/77   Pulse (!) 117   Temp 97.4 F (36.3 C) (Oral)   Resp 14   SpO2 98%   .  Physical Exam  Constitutional: Vital signs are normal. He appears well-developed and well-nourished. He is uncooperative. No distress.  HENT:  Head: Normocephalic. Head is with contusion. Head is without raccoon's eyes and without Battle's sign.  Right Ear: External ear normal.  Left Ear: External ear normal.  Nose: Nose normal.  Mouth/Throat: Oropharynx is clear and moist.  Eyes: Conjunctivae and EOM are normal. Pupils are equal, round, and reactive to light. Right eye exhibits no discharge. Left eye exhibits no discharge. No scleral icterus.  Neck: Normal range of motion. Neck supple. No tracheal deviation present.  Cardiovascular: Normal rate, regular rhythm and intact distal pulses.  Exam reveals no gallop and no friction rub.   No murmur heard. Pulmonary/Chest: Effort normal and breath sounds normal. No respiratory distress. He has no wheezes.  Abdominal: Soft. Bowel sounds are normal. He exhibits no distension. There is no tenderness. There is no guarding.  Musculoskeletal: He exhibits no edema or deformity.  Lymphadenopathy:    He has no cervical adenopathy.  Neurological: He is alert. He exhibits normal muscle tone.  Patient oriented to person, not place or time.  Full assessment of cranial nerves, sensory function was limited due to patient not being cooperative.    Skin: Skin is warm and dry. He is not diaphoretic.  Psychiatric: His affect is angry. He is agitated. He exhibits abnormal recent memory and abnormal remote memory.  Nursing note and vitals reviewed.    ED Treatments / Results   Labs (all labs ordered are listed, but only abnormal results are displayed) Labs Reviewed  CBG MONITORING, ED - Abnormal; Notable for the following:       Result Value   Glucose-Capillary 193 (*)    All other components within normal limits    EKG  EKG Interpretation  Date/Time:  Tuesday August 16 2016 12:28:33 EDT Ventricular Rate:  118 PR Interval:    QRS Duration: 100 QT Interval:  316 QTC Calculation: 442 R Axis:   36 Text Interpretation:  Atrial fibrillation with rapid ventricular response with premature ventricular or aberrantly conducted complexes Incomplete right bundle branch block ST & T wave abnormality, consider anterior ischemia or digitalis effect Abnormal ECG Since previous tracing PVCs are new Confirmed by Canary Brim  MD, MARTHA [phone]) on 08/16/2016 2:59:36 PM       Radiology Dg Chest 2 View  Result Date: 08/15/2016 CLINICAL DATA:  [place] from standing position.  Tachycardia. EXAM: CHEST  2 VIEW COMPARISON:  12/27/2014 FINDINGS: Shallow inspiration with linear atelectasis or scarring in the left lung base, similar to prior study. No developing consolidation or airspace disease. Blunting of the left costophrenic angle is unchanged since prior study, likely representing pleural thickening. No pneumothorax. Normal heart size and pulmonary vascularity. Calcified aorta. Old bilateral rib fractures. Degenerative changes in the spine and shoulders. IMPRESSION: Left pleural thickening with linear atelectasis or scarring in the left lung base. No change since prior study. No evidence of active pulmonary disease. Electronically Signed   By: Lucienne Capers M.D.   On: 08/15/2016 23:18   Ct Head Wo Contrast  Result Date: 08/16/2016 CLINICAL DATA:  83 year old male status post fall on blood thinners. EXAM: CT HEAD WITHOUT CONTRAST CT CERVICAL SPINE WITHOUT CONTRAST TECHNIQUE: Multidetector CT imaging of the head and cervical spine was performed following the standard protocol without  intravenous contrast. Multiplanar CT image reconstructions of the cervical spine were also generated. COMPARISON:  CT head and cervical spine from yesterday 08/15/2016, 08/07/2016, and earlier FINDINGS: CT HEAD FINDINGS Brain: Stable gray-white matter differentiation throughout the brain. No midline shift, ventriculomegaly, mass effect, evidence of mass lesion, intracranial hemorrhage or evidence of cortically based acute infarction. Chronic white matter hypodensity. No cortical encephalomalacia identified. Vascular: Calcified atherosclerosis at the skull base. Mild intracranial artery ectasia. No suspicious intracranial vascular hyperdensity. Skull: Stable and intact.  Osteopenia. Sinuses/Orbits: Visualized paranasal sinuses and mastoids are stable and well pneumatized. Other: Regressed left scalp hematoma since 08/07/2016. No new orbit or scalp soft tissue abnormality. CT CERVICAL SPINE FINDINGS Alignment: Stable cervical vertebral height and alignment. C2-C3 ankylosis. Bilateral posterior element alignment is within normal limits. Stable cervicothoracic junction alignment. Skull base and vertebrae: Visualized skull base is intact. No atlanto-occipital dissociation. No cervical spine fracture identified. Soft tissues and spinal canal: No prevertebral fluid or swelling. No visible canal hematoma. Negative noncontrast neck soft tissues except for calcified carotid artery atherosclerosis. Disc levels: Chronic cervical spine degeneration, especially mid and lower cervical facet arthropathy, with chronic spinal stenosis at C3-C4 and C4-C5. Upper chest: T2 inferior endplate compression fracture with 10-15% loss of T2 vertebral body height. No significant retropulsion or complicating features. T2 posterior elements appear intact. This appears subacute, not significantly changed since 08/06/2016. No definite additional acute or subacute upper thoracic fracture ; mild T4 superior endplate deformity appears chronic.  Negative lung apices. Negative Visible noncontrast superior mediastinum. IMPRESSION: 1. Subacute appearing T2 inferior endplate compression fracture with mild loss of vertebral body height. 2. No acute intracranial abnormality. 3. Regressed left scalp hematoma since 08/07/2016. 4. Stable cervical spine degeneration. No acute fracture or listhesis in the cervical spine. Electronically Signed   By: Genevie Ann M.D.   On: 08/16/2016 15:38   Ct Cervical Spine Wo Contrast  Result Date: 08/16/2016 CLINICAL DATA:  83 year old male status post fall on blood thinners. EXAM: CT HEAD WITHOUT CONTRAST CT CERVICAL SPINE WITHOUT CONTRAST TECHNIQUE: Multidetector CT imaging of the head and cervical spine was performed following the standard protocol without intravenous contrast. Multiplanar CT image reconstructions of the cervical spine were also generated. COMPARISON:  CT head and cervical spine from yesterday 08/15/2016, 08/07/2016, and earlier FINDINGS: CT HEAD FINDINGS Brain: Stable gray-white matter differentiation throughout the brain. No midline shift, ventriculomegaly, mass effect, evidence of mass lesion, intracranial hemorrhage or evidence of cortically based acute infarction. Chronic white matter hypodensity. No cortical encephalomalacia identified. Vascular: Calcified  atherosclerosis at the skull base. Mild intracranial artery ectasia. No suspicious intracranial vascular hyperdensity. Skull: Stable and intact.  Osteopenia. Sinuses/Orbits: Visualized paranasal sinuses and mastoids are stable and well pneumatized. Other: Regressed left scalp hematoma since 08/07/2016. No new orbit or scalp soft tissue abnormality. CT CERVICAL SPINE FINDINGS Alignment: Stable cervical vertebral height and alignment. C2-C3 ankylosis. Bilateral posterior element alignment is within normal limits. Stable cervicothoracic junction alignment. Skull base and vertebrae: Visualized skull base is intact. No atlanto-occipital dissociation. No  cervical spine fracture identified. Soft tissues and spinal canal: No prevertebral fluid or swelling. No visible canal hematoma. Negative noncontrast neck soft tissues except for calcified carotid artery atherosclerosis. Disc levels: Chronic cervical spine degeneration, especially mid and lower cervical facet arthropathy, with chronic spinal stenosis at C3-C4 and C4-C5. Upper chest: T2 inferior endplate compression fracture with 10-15% loss of T2 vertebral body height. No significant retropulsion or complicating features. T2 posterior elements appear intact. This appears subacute, not significantly changed since 08/06/2016. No definite additional acute or subacute upper thoracic fracture ; mild T4 superior endplate deformity appears chronic. Negative lung apices. Negative Visible noncontrast superior mediastinum. IMPRESSION: 1. Subacute appearing T2 inferior endplate compression fracture with mild loss of vertebral body height. 2. No acute intracranial abnormality. 3. Regressed left scalp hematoma since 08/07/2016. 4. Stable cervical spine degeneration. No acute fracture or listhesis in the cervical spine. Electronically Signed   By: Genevie Ann M.D.   On: 08/16/2016 15:38     Procedures Procedures (including critical care time)  Medications Ordered in ED Medications - No data to display   Initial Impression / Assessment and Plan / ED Course  I have reviewed the triage vital signs and the nursing notes.  Pertinent labs & imaging results that were available during my care of the patient were reviewed by me and considered in my medical decision making (see chart for details).  While waiting for CT scan patient fell asleep, was noted to be difficult to arouse.  When aroused was back to his pre nap state.  Ativan held. Will continue to monitor.      Joe [name] is here from his nursing home after falling again this morning.  He was here last night for a fall.  As his labs were last drawn less than 24  hours prior to arrival, repeat labs were not ordered.  Patients reported change in mental status is most likely due to his previously diagnosed UTI for which he is already being treated.  His Head and neck CTs were unchanged from the ones ordered last night.  He was repeat scanned based on concern for worsening mental status and new fall while anticoagulated with eliquis.  A full neurological assessment was unable to be performed as patient was agitated, not cooperative with the exam.    At this time there does not appear to be any evidence of an acute emergency medical condition and the patient appears stable for discharge with appropriate outpatient follow up.Diagnosis was discussed with patient who verbalizes understanding and is agreeable to discharge. Pt case discussed with Dr. Canary Brim who agrees with my plan.    Final Clinical Impressions(s) / ED Diagnoses   Final diagnoses:  Fall, initial encounter    New Prescriptions Discharge Medication List as of 08/16/2016  3:52 PM       Lorin Glass, PA-C 08/16/16 2005    Alfonzo Beers, MD 08/17/16 [phone]

## 2016-08-16 NOTE — ED Notes (Signed)
Anderson Malta, nurse @ [facility] called to check on patient.  She is wanting to know if a CT head was going to be done and this nurse advised the order has already been placed.  She is concerned because of number of falls and slurred speech is worsening.

## 2016-08-16 NOTE — ED Triage Notes (Signed)
To hallway bed via EMS.  Pt from [facility], onset this morning pt had unwittnessed but heard fall in hallway.  Staff found pt sitting, leaning against door frame.  Pt has old bruises on face and scab on left side of head.  Staff report pt has no new bruises or scrapes.  Pt has had multiple falls since arriving at facility on 07-29-16.   Alert to person and situation (knows he fell).  Pt refused IV from EMS.  Staff thought pt was non compliant with meds previously to arriving at facility. BP has been down and medications are being adjusted.  C collar on.

## 2016-08-16 NOTE — Discharge Instructions (Signed)
There was evidence of a possible UTI on the urinalysis. Please take all of your antibiotics until finished!   You may develop abdominal discomfort or diarrhea from the antibiotic.  You may help offset this with probiotics which you can buy or get in yogurt. Do not eat or take the probiotics until 2 hours after your antibiotic.  The other lab results today were reassuring and there were no acute abnormalities on the imaging studies.

## 2016-08-17 DIAGNOSIS — N400 Enlarged prostate without lower urinary tract symptoms: Secondary | ICD-10-CM | POA: Diagnosis not present

## 2016-08-17 DIAGNOSIS — E119 Type 2 diabetes mellitus without complications: Secondary | ICD-10-CM | POA: Diagnosis not present

## 2016-08-17 DIAGNOSIS — R296 Repeated falls: Secondary | ICD-10-CM | POA: Diagnosis not present

## 2016-08-17 DIAGNOSIS — R269 Unspecified abnormalities of gait and mobility: Secondary | ICD-10-CM | POA: Diagnosis not present

## 2016-08-17 DIAGNOSIS — Z7984 Long term (current) use of oral hypoglycemic drugs: Secondary | ICD-10-CM | POA: Diagnosis not present

## 2016-08-17 DIAGNOSIS — E039 Hypothyroidism, unspecified: Secondary | ICD-10-CM | POA: Diagnosis not present

## 2016-08-17 DIAGNOSIS — R531 Weakness: Secondary | ICD-10-CM | POA: Diagnosis not present

## 2016-08-17 DIAGNOSIS — R600 Localized edema: Secondary | ICD-10-CM | POA: Diagnosis not present

## 2016-08-17 DIAGNOSIS — I4891 Unspecified atrial fibrillation: Secondary | ICD-10-CM | POA: Diagnosis not present

## 2016-08-17 LAB — URINE CULTURE: CULTURE: NO GROWTH

## 2016-08-18 DIAGNOSIS — I10 Essential (primary) hypertension: Secondary | ICD-10-CM | POA: Diagnosis not present

## 2016-08-18 DIAGNOSIS — L219 Seborrheic dermatitis, unspecified: Secondary | ICD-10-CM | POA: Diagnosis not present

## 2016-08-18 DIAGNOSIS — N390 Urinary tract infection, site not specified: Secondary | ICD-10-CM | POA: Diagnosis not present

## 2016-08-18 DIAGNOSIS — R1310 Dysphagia, unspecified: Secondary | ICD-10-CM | POA: Diagnosis not present

## 2016-08-19 DIAGNOSIS — Z7984 Long term (current) use of oral hypoglycemic drugs: Secondary | ICD-10-CM | POA: Diagnosis not present

## 2016-08-19 DIAGNOSIS — R296 Repeated falls: Secondary | ICD-10-CM | POA: Diagnosis not present

## 2016-08-19 DIAGNOSIS — I4891 Unspecified atrial fibrillation: Secondary | ICD-10-CM | POA: Diagnosis not present

## 2016-08-19 DIAGNOSIS — N400 Enlarged prostate without lower urinary tract symptoms: Secondary | ICD-10-CM | POA: Diagnosis not present

## 2016-08-19 DIAGNOSIS — R269 Unspecified abnormalities of gait and mobility: Secondary | ICD-10-CM | POA: Diagnosis not present

## 2016-08-19 DIAGNOSIS — E039 Hypothyroidism, unspecified: Secondary | ICD-10-CM | POA: Diagnosis not present

## 2016-08-19 DIAGNOSIS — R600 Localized edema: Secondary | ICD-10-CM | POA: Diagnosis not present

## 2016-08-19 DIAGNOSIS — R531 Weakness: Secondary | ICD-10-CM | POA: Diagnosis not present

## 2016-08-19 DIAGNOSIS — E119 Type 2 diabetes mellitus without complications: Secondary | ICD-10-CM | POA: Diagnosis not present

## 2016-08-22 DIAGNOSIS — I4891 Unspecified atrial fibrillation: Secondary | ICD-10-CM | POA: Diagnosis not present

## 2016-08-22 DIAGNOSIS — R296 Repeated falls: Secondary | ICD-10-CM | POA: Diagnosis not present

## 2016-08-22 DIAGNOSIS — R269 Unspecified abnormalities of gait and mobility: Secondary | ICD-10-CM | POA: Diagnosis not present

## 2016-08-22 DIAGNOSIS — E039 Hypothyroidism, unspecified: Secondary | ICD-10-CM | POA: Diagnosis not present

## 2016-08-22 DIAGNOSIS — R600 Localized edema: Secondary | ICD-10-CM | POA: Diagnosis not present

## 2016-08-22 DIAGNOSIS — Z7984 Long term (current) use of oral hypoglycemic drugs: Secondary | ICD-10-CM | POA: Diagnosis not present

## 2016-08-22 DIAGNOSIS — E119 Type 2 diabetes mellitus without complications: Secondary | ICD-10-CM | POA: Diagnosis not present

## 2016-08-22 DIAGNOSIS — N400 Enlarged prostate without lower urinary tract symptoms: Secondary | ICD-10-CM | POA: Diagnosis not present

## 2016-08-22 DIAGNOSIS — R531 Weakness: Secondary | ICD-10-CM | POA: Diagnosis not present

## 2016-08-23 ENCOUNTER — Emergency Department (HOSPITAL_COMMUNITY): Payer: Medicare Other

## 2016-08-23 ENCOUNTER — Encounter (HOSPITAL_COMMUNITY): Payer: Self-pay | Admitting: Emergency Medicine

## 2016-08-23 ENCOUNTER — Emergency Department (HOSPITAL_COMMUNITY)
Admission: EM | Admit: 2016-08-23 | Discharge: 2016-08-23 | Disposition: A | Discharge status: Home / Self Care | Payer: Medicare Other | Attending: Emergency Medicine | Admitting: Emergency Medicine

## 2016-08-23 DIAGNOSIS — S098XXA Other specified injuries of head, initial encounter: Secondary | ICD-10-CM | POA: Diagnosis not present

## 2016-08-23 DIAGNOSIS — Z7984 Long term (current) use of oral hypoglycemic drugs: Secondary | ICD-10-CM | POA: Insufficient documentation

## 2016-08-23 DIAGNOSIS — Z7901 Long term (current) use of anticoagulants: Secondary | ICD-10-CM | POA: Diagnosis not present

## 2016-08-23 DIAGNOSIS — Y9289 Other specified places as the place of occurrence of the external cause: Secondary | ICD-10-CM | POA: Diagnosis not present

## 2016-08-23 DIAGNOSIS — M79641 Pain in right hand: Secondary | ICD-10-CM | POA: Diagnosis not present

## 2016-08-23 DIAGNOSIS — I10 Essential (primary) hypertension: Secondary | ICD-10-CM | POA: Diagnosis not present

## 2016-08-23 DIAGNOSIS — S199XXA Unspecified injury of neck, initial encounter: Secondary | ICD-10-CM | POA: Diagnosis not present

## 2016-08-23 DIAGNOSIS — Z79899 Other long term (current) drug therapy: Secondary | ICD-10-CM | POA: Diagnosis not present

## 2016-08-23 DIAGNOSIS — W01198A Fall on same level from slipping, tripping and stumbling with subsequent striking against other object, initial encounter: Secondary | ICD-10-CM | POA: Insufficient documentation

## 2016-08-23 DIAGNOSIS — M542 Cervicalgia: Secondary | ICD-10-CM | POA: Diagnosis not present

## 2016-08-23 DIAGNOSIS — E039 Hypothyroidism, unspecified: Secondary | ICD-10-CM | POA: Diagnosis not present

## 2016-08-23 DIAGNOSIS — S0181XA Laceration without foreign body of other part of head, initial encounter: Secondary | ICD-10-CM | POA: Insufficient documentation

## 2016-08-23 DIAGNOSIS — Y9301 Activity, walking, marching and hiking: Secondary | ICD-10-CM | POA: Diagnosis not present

## 2016-08-23 DIAGNOSIS — S61411A Laceration without foreign body of right hand, initial encounter: Secondary | ICD-10-CM | POA: Diagnosis not present

## 2016-08-23 DIAGNOSIS — S0990XA Unspecified injury of head, initial encounter: Secondary | ICD-10-CM | POA: Insufficient documentation

## 2016-08-23 DIAGNOSIS — E119 Type 2 diabetes mellitus without complications: Secondary | ICD-10-CM | POA: Insufficient documentation

## 2016-08-23 DIAGNOSIS — Y999 Unspecified external cause status: Secondary | ICD-10-CM | POA: Insufficient documentation

## 2016-08-23 DIAGNOSIS — R296 Repeated falls: Secondary | ICD-10-CM | POA: Diagnosis not present

## 2016-08-23 DIAGNOSIS — R51 Headache: Secondary | ICD-10-CM | POA: Diagnosis not present

## 2016-08-23 DIAGNOSIS — W19XXXA Unspecified fall, initial encounter: Secondary | ICD-10-CM

## 2016-08-23 DIAGNOSIS — S6991XA Unspecified injury of right wrist, hand and finger(s), initial encounter: Secondary | ICD-10-CM | POA: Diagnosis not present

## 2016-08-23 NOTE — ED Provider Notes (Signed)
[place] DEPT Provider Note   CSN: [id number] Arrival date & time: 08/23/16  0602     History   Chief Complaint Chief Complaint  Patient presents with  . Fall  . Head Injury    HPI Joe [name] is a 83 y.o. male.  HPI   Pt is a 83 yo male with PMH of HTN, dementia and Afib on Eliquis who presents to the ED via EMS from [facility] with complaint of witnessed mechanical fall. EMS reports pt was seen walking in the rain outside his facility, slipped and fell on the ground hitting his right forehead on a rock. Denies LOC. Pt denies any pain or complaints at this time. Nursing facility reports pt was at baseline s//p fall (alert to person and self only). Pt with hx of multiple falls. Chart review shows tetanus UTD.   LEVEL V CAVEAT- Dementia  Past Medical History:  Diagnosis Date  . Anxiety   . Atrial fibrillation with RVR ([place]) 08/28/2014   [name] 08/28/2014  . BPH (benign prostatic hypertrophy)    [name] 08/28/2014  . Hypertension    [name] 08/28/2014  . Hypothyroidism    [name] 08/28/2014  . Thyroid disease     Patient Active Problem List   Diagnosis Date Noted  . Adjustment disorder with mixed anxiety and depressed mood 11/13/2014  . Cognitive deficits 11/13/2014  . Syncope and collapse 11/10/2014  . Orthostatic hypotension 11/10/2014  . Hypothyroidism 08/28/2014  . Essential hypertension 08/28/2014  . Atrial fibrillation, new onset ([place]) 08/28/2014  . Dizziness 08/28/2014  . BPH (benign prostatic hypertrophy) 08/28/2014  . Diabetes mellitus with no complication ([place]) 40/98/1191  . Risky sexual behavior 08/28/2014  . A-fib ([place]) 08/28/2014  . Hypothyroid 09/10/2013  . BPH (benign prostatic hyperplasia) 09/10/2013    Past Surgical History:  Procedure Laterality Date  . PARS PLANA VITRECTOMY Left 03/30/2013   Procedure: PARS PLANA VITRECTOMY 25 GAUGE FOR ENDOPHTHALMITIS;  Surgeon: Hurman Horn, MD;  Location: [facility];  Service: Ophthalmology;  Laterality:  Left;  LOCAL RETROBULBAR,,  USE MARCAINE 0.5%       Home Medications    Prior to Admission medications   Medication Sig Start Date End Date Taking? Authorizing Provider  Alogliptin-Pioglitazone (OSENI) 25-15 MG TABS Take 1 tablet by mouth daily.   Yes Historical Provider, MD  apixaban (ELIQUIS) 5 MG TABS tablet Take 1 tablet (5 mg total) by mouth 2 (two) times daily. 12/27/14  Yes Robyn Haber, MD  ciprofloxacin (CILOXAN) 0.3 % ophthalmic solution Place 2 drops into both eyes every 4 (four) hours while awake. Administer 1 drop, every 2 hours, while awake, for 2 days. Then 1 drop, every 4 hours, while awake, for the next 5 days.   Yes Historical Provider, MD  levothyroxine (SYNTHROID, LEVOTHROID) 100 MCG tablet Take 100 mcg by mouth daily before breakfast.   Yes Historical Provider, MD  LORazepam (ATIVAN) 0.5 MG tablet Take 1 tablet (0.5 mg total) by mouth 2 (two) times daily as needed for anxiety. Patient taking differently: Take 0.5 mg by mouth every 8 (eight) hours as needed for anxiety.  07/28/16  Yes Mancel Bale, PA-C  metoprolol tartrate (LOPRESSOR) 25 MG tablet Take 12.5 mg by mouth daily.    Yes Historical Provider, MD  neomycin-polymyxin-pramoxine (NEOSPORIN PLUS) 1 % cream Apply topically 2 (two) times daily.   Yes Historical Provider, MD  risperiDONE (RISPERDAL) 0.5 MG tablet Take 0.5 mg by mouth 2 (two) times daily.   Yes Historical Provider, MD  tamsulosin (FLOMAX) 0.4 MG CAPS capsule Take 0.4 mg by mouth daily.   Yes Historical Provider, MD  cephALEXin (KEFLEX) 500 MG capsule Take 1 capsule (500 mg total) by mouth 4 (four) times daily. Patient not taking: Reported on 08/23/2016 08/16/16   Shawn C Joy, PA-C  citalopram (CELEXA) 20 MG tablet Take 1 tablet (20 mg total) by mouth daily. Patient not taking: Reported on 08/06/2016 01/22/16   Wendie Agreste, MD  levothyroxine (SYNTHROID, LEVOTHROID) 125 MCG tablet Take 1 tablet (125 mcg total) by mouth daily before breakfast. Patient  not taking: Reported on 08/23/2016 12/28/14   Robyn Haber, MD  pioglitazone (ACTOS) 15 MG tablet Take 15 mg by mouth daily.    Historical Provider, MD  ramipril (ALTACE) 5 MG capsule Take 1 capsule (5 mg total) by mouth daily. Patient not taking: Reported on 08/23/2016 01/22/16   Wendie Agreste, MD    Family History Family History  Problem Relation Age of Onset  . Hyperlipidemia Sister     Social History Social History  Substance Use Topics  . Smoking status: Never Smoker  . Smokeless tobacco: Never Used  . Alcohol use No     Allergies   No known allergies   Review of Systems Review of Systems  Skin: Positive for wound (laceration, skin tear).  All other systems reviewed and are negative.    Physical Exam Updated Vital Signs BP (!) 142/91   Pulse (!) 112   Temp 97.8 F (36.6 C) (Oral)   Resp 17   Wt 81.6 kg   SpO2 100%   BMI 24.41 kg/m   Physical Exam  Constitutional: He appears well-developed and well-nourished. No distress.  HENT:  Head: Normocephalic. Head is with contusion and with laceration. Head is without raccoon's eyes, without Battle's sign and without abrasion.    Right Ear: Tympanic membrane normal. No hemotympanum.  Left Ear: Tympanic membrane normal. No hemotympanum.  Nose: Nose normal. No sinus tenderness, nasal deformity, septal deviation or nasal septal hematoma. No epistaxis.  Mouth/Throat: Uvula is midline, oropharynx is clear and moist and mucous membranes are normal. No oropharyngeal exudate, posterior oropharyngeal edema, posterior oropharyngeal erythema or tonsillar abscesses.  Eyes: Conjunctivae and EOM are normal. Right eye exhibits no discharge. Left eye exhibits no discharge. No scleral icterus.  Right pupil: 82mm, Left pupil: 2-3 mm, this abnormality was previously documented on 08/07/16 as baseline anisocoria  Neck: Normal range of motion. Neck supple.  Cardiovascular: Normal rate, regular rhythm, normal heart sounds and intact  distal pulses.   Pulmonary/Chest: Effort normal and breath sounds normal. No respiratory distress. He has no wheezes. He has no rales. He exhibits no tenderness.  Abdominal: Soft. Bowel sounds are normal. He exhibits no distension and no mass. There is no tenderness. There is no rebound and no guarding. No hernia.  Musculoskeletal: Normal range of motion. He exhibits no edema.  No cervical, thoracic, or lumbar spine midline TTP. Full ROM of bilateral upper and lower extremities with 5/5 strength.   2+ radial and PT pulses. Sensation grossly intact.   Neurological: He is alert. He has normal strength. No cranial nerve deficit or sensory deficit. Coordination and gait normal.  Alert and oriented to person and place only.   Skin: Skin is warm and dry. He is not diaphoretic.  5cm laceration present to dorsal aspect of right hand, no active bleeding.  Small abrasion present to right forehead with contusion, no active bleeding.  Nursing note and vitals reviewed.  ED Treatments / Results  Labs (all labs ordered are listed, but only abnormal results are displayed) Labs Reviewed - No data to display  EKG  EKG Interpretation None       Radiology Ct Head Wo Contrast  Result Date: 08/23/2016 CLINICAL DATA:  Pain following fall EXAM: CT HEAD WITHOUT CONTRAST CT CERVICAL SPINE WITHOUT CONTRAST TECHNIQUE: Multidetector CT imaging of the head and cervical spine was performed following the standard protocol without intravenous contrast. Multiplanar CT image reconstructions of the cervical spine were also generated. COMPARISON:  CT head and CT cervical spine August 16, 2016 ; CT head and CT cervical spine August 07, 2016 FINDINGS: CT HEAD FINDINGS Brain: Moderate diffuse atrophy is stable. There is no intracranial mass, hemorrhage, extra-axial fluid collection, or midline shift. There is small vessel disease throughout much of the centra semiovale bilaterally, stable. There is no new gray-white  compartment lesion. No acute infarct evident. Vascular: There is no demonstrable hyperdense vessel. There is calcification throughout both carotid siphon regions as well as areas of calcification in the distal vertebral arteries. Skull: The bony calvarium appears intact. There are small bilateral frontal scalp hematomas. Sinuses/Orbits: There is mild mucosal thickening in several ethmoid air cells bilaterally. Other visualized paranasal sinuses are clear. No intraorbital lesions evident. Other: Mastoid air cells are clear. CT CERVICAL SPINE FINDINGS Alignment: There is minimal anterolisthesis at C6 on C7 and minimal anterolisthesis of C7 on T1, stable. There is no new spondylolisthesis. There is cervical levoscoliosis. Skull base and vertebrae: The skull base and craniocervical junction regions appear unremarkable. There is stable slight anterior wedging of the C7 and T2 vertebral bodies. No new evidence of fracture. No blastic or lytic bone lesions. Bones are diffusely osteoporotic. Soft tissues and spinal canal: Prevertebral soft tissues and predental space regions are normal. There is no appreciable paraspinous lesion. There is no cord or canal hematoma demonstrable. Disc levels: There is partial ankylosis at C2-3. There is moderate disc space narrowing at all levels elsewhere. There is facet hypertrophy at all levels to varying degrees bilaterally. There is marked exit foraminal narrowing impressing on the exiting nerve root at C3-4 on the left, at C4-5 bilaterally, and to a lesser extent at C5-6 on the right. No frank disc extrusion. Upper chest: Visualized portions of the lung apices appear clear. Other: There is atherosclerotic calcification in both carotid arteries. IMPRESSION: CT head: Persistent atrophy with supratentorial small vessel disease. No intracranial mass, hemorrhage, or extra-axial fluid collection. No acute appearing infarct. Multiple areas of arteriovascular calcification. Mild ethmoid sinus  mucosal thickening. Small bilateral frontal scalp hematomas. CT cervical spine: Slight anterior wedging at C7 and T1 remain stable. No acute fracture. Slight spondylolisthesis at C6-7 and C7-T1 are stable. No new spondylolisthesis. Bones are osteoporotic. There is multifocal arthropathy. There is calcification in both carotid arteries. Electronically Signed   By: Lowella Grip III M.D.   On: 08/23/2016 07:52   Ct Cervical Spine Wo Contrast  Result Date: 08/23/2016 CLINICAL DATA:  Pain following fall EXAM: CT HEAD WITHOUT CONTRAST CT CERVICAL SPINE WITHOUT CONTRAST TECHNIQUE: Multidetector CT imaging of the head and cervical spine was performed following the standard protocol without intravenous contrast. Multiplanar CT image reconstructions of the cervical spine were also generated. COMPARISON:  CT head and CT cervical spine August 16, 2016 ; CT head and CT cervical spine August 07, 2016 FINDINGS: CT HEAD FINDINGS Brain: Moderate diffuse atrophy is stable. There is no intracranial mass, hemorrhage, extra-axial fluid collection, or midline shift.  There is small vessel disease throughout much of the centra semiovale bilaterally, stable. There is no new gray-white compartment lesion. No acute infarct evident. Vascular: There is no demonstrable hyperdense vessel. There is calcification throughout both carotid siphon regions as well as areas of calcification in the distal vertebral arteries. Skull: The bony calvarium appears intact. There are small bilateral frontal scalp hematomas. Sinuses/Orbits: There is mild mucosal thickening in several ethmoid air cells bilaterally. Other visualized paranasal sinuses are clear. No intraorbital lesions evident. Other: Mastoid air cells are clear. CT CERVICAL SPINE FINDINGS Alignment: There is minimal anterolisthesis at C6 on C7 and minimal anterolisthesis of C7 on T1, stable. There is no new spondylolisthesis. There is cervical levoscoliosis. Skull base and vertebrae: The skull  base and craniocervical junction regions appear unremarkable. There is stable slight anterior wedging of the C7 and T2 vertebral bodies. No new evidence of fracture. No blastic or lytic bone lesions. Bones are diffusely osteoporotic. Soft tissues and spinal canal: Prevertebral soft tissues and predental space regions are normal. There is no appreciable paraspinous lesion. There is no cord or canal hematoma demonstrable. Disc levels: There is partial ankylosis at C2-3. There is moderate disc space narrowing at all levels elsewhere. There is facet hypertrophy at all levels to varying degrees bilaterally. There is marked exit foraminal narrowing impressing on the exiting nerve root at C3-4 on the left, at C4-5 bilaterally, and to a lesser extent at C5-6 on the right. No frank disc extrusion. Upper chest: Visualized portions of the lung apices appear clear. Other: There is atherosclerotic calcification in both carotid arteries. IMPRESSION: CT head: Persistent atrophy with supratentorial small vessel disease. No intracranial mass, hemorrhage, or extra-axial fluid collection. No acute appearing infarct. Multiple areas of arteriovascular calcification. Mild ethmoid sinus mucosal thickening. Small bilateral frontal scalp hematomas. CT cervical spine: Slight anterior wedging at C7 and T1 remain stable. No acute fracture. Slight spondylolisthesis at C6-7 and C7-T1 are stable. No new spondylolisthesis. Bones are osteoporotic. There is multifocal arthropathy. There is calcification in both carotid arteries. Electronically Signed   By: Lowella Grip III M.D.   On: 08/23/2016 07:52   Dg Hand Complete Right  Result Date: 08/23/2016 CLINICAL DATA:  83 year old male status post slip and fall with pain and skin tear. EXAM: RIGHT HAND - COMPLETE 3+ VIEW COMPARISON:  None. FINDINGS: Bone mineralization is within normal limits for age. Distal radius and ulna appear intact. Carpal bones appear intact and normally aligned. Chronic  joint space loss, subchondral sclerosis and osteophytosis at the first [facility] joint. Metacarpals appear intact. Pulse oximeter on the right third distal phalanx. Other phalanges appear intact. MCP and IP joint spaces are normal for age. IMPRESSION: 1. No acute fracture or dislocation identified about the right hand. 2. 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 Electronically Signed   By: Genevie Ann M.D.   On: 08/23/2016 07:34    Procedures .Marland KitchenLaceration Repair Date/Time: 08/23/2016 8:57 AM Performed by: Nona Dell Authorized by: Nona Dell   Consent:    Consent obtained:  Verbal   Consent given by:  Patient Anesthesia (see MAR for exact dosages):    Anesthesia method:  None Laceration details:    Location:  Hand   Hand location:  R hand, dorsum   Length (cm):  5 Repair type:    Repair type:  Simple Pre-procedure details:    Preparation:  Imaging obtained to evaluate for foreign bodies Exploration:    Wound exploration: wound explored through full range  of motion and entire depth of wound probed and visualized     Wound extent: no foreign bodies/material noted, no muscle damage noted, no nerve damage noted, no tendon damage noted, no underlying fracture noted and no vascular damage noted     Contaminated: no   Treatment:    Area cleansed with:  Saline   Amount of cleaning:  Standard   Irrigation solution:  Sterile water   Irrigation method:  Syringe   Visualized foreign bodies/material removed: no   Skin repair:    Repair method:  Steri-Strips   Number of Steri-Strips:  3 Approximation:    Approximation:  Close   Vermilion border: well-aligned   Post-procedure details:    Dressing:  Non-adherent dressing   Patient tolerance of procedure:  Tolerated well, no immediate complications   (including critical care time)  Medications Ordered in ED Medications - No data to display   Initial Impression / Assessment and Plan / ED Course  I have reviewed  the triage vital signs and the nursing notes.  Pertinent labs & imaging results that were available during my care of the patient were reviewed by me and considered in my medical decision making (see chart for details).    Pt presents s/p witnessed mechanical fall with head injury. Denies LOC. Pt denies any pain or complaints. PMH of HTN, dementia and Afib on Eliquis. Pt at basline mental status (A&Ox2). VSS. Exam showed small abrasion laceration/skin tear present to right hand, no active bleeding. Remaining exam unremarkable. Moving all 4 extremities spontaneously with 5/5 strength. CT head and cervical spine with no acute findings. Right hand xray negative. Wounds irrigated with saline. Steri-strips applied to skin tear on hand. Dressing applied to both wounds. Pt able to stand and ambulate. D/c back to nursing facility.   Final Clinical Impressions(s) / ED Diagnoses   Final diagnoses:  Fall, initial encounter  Injury of head, initial encounter  Laceration of right hand without foreign body, initial encounter    New Prescriptions New Prescriptions   No medications on file     Nona Dell, [place] 08/23/16 [id number]    Isla Pence, MD 08/23/16 1304

## 2016-08-23 NOTE — Discharge Instructions (Signed)
Keep wound clean using soap and water, pat dry. Continue taking your home meds as prescribed. Follow up with your primary care provider in 2-3 days for follow up evaluation. Please return to the Emergency Department if symptoms worsen or new onset of fever, headache, altered mental status, confusion, weakness, chest pain, difficulty breathing, abdominal pain, vomiting, weakness, syncope, seizure.

## 2016-08-23 NOTE — Progress Notes (Signed)
CSW confirmed that Patient is from [facility] in [place] memory care locked assisted living facility and not [place]. Per Evlyn Clines at [facility], Patient was advised not to go outside by the med tech but upon medication administration, Patient went out patio door (which they keep unlocked so that residents can go in and out). Evlyn Clines reports that Patient was found outside. CSW has staffed this information with bedside RN.    Lorrine Kin, MSW, LCSW [facility] ED/76M Clinical Social Worker [phone]

## 2016-08-23 NOTE — ED Notes (Addendum)
Pt ambulated with 2 person assist to the hall and back to the stretcher.

## 2016-08-23 NOTE — ED Triage Notes (Signed)
Pt in from [place] via [facility] EMS after witnessed mechanical fall. Per EMS, pt was walking outside facility in rain, slipped and fell, hitting R forehead on rock. Pt is alert to self and place, baseline per facility. Hx of afib, multiple recent falls, pt takes Eliquis. Old bruises present to L eye, L hand. New lac to R forehead, skin tear to R hand. CBG 330, no hx of DM.

## 2016-08-23 NOTE — ED Notes (Signed)
Patient refused to change into a gown and would not let us get his temperature.  Pt adamant about keeping track of his shirt.

## 2016-08-23 NOTE — ED Notes (Signed)
Patient transported to CT 

## 2016-08-23 NOTE — ED Notes (Signed)
Pt in CT then x-ray.

## 2016-08-24 DIAGNOSIS — R600 Localized edema: Secondary | ICD-10-CM | POA: Diagnosis not present

## 2016-08-24 DIAGNOSIS — R531 Weakness: Secondary | ICD-10-CM | POA: Diagnosis not present

## 2016-08-24 DIAGNOSIS — E039 Hypothyroidism, unspecified: Secondary | ICD-10-CM | POA: Diagnosis not present

## 2016-08-24 DIAGNOSIS — N400 Enlarged prostate without lower urinary tract symptoms: Secondary | ICD-10-CM | POA: Diagnosis not present

## 2016-08-24 DIAGNOSIS — I4891 Unspecified atrial fibrillation: Secondary | ICD-10-CM | POA: Diagnosis not present

## 2016-08-24 DIAGNOSIS — R296 Repeated falls: Secondary | ICD-10-CM | POA: Diagnosis not present

## 2016-08-24 DIAGNOSIS — R269 Unspecified abnormalities of gait and mobility: Secondary | ICD-10-CM | POA: Diagnosis not present

## 2016-08-24 DIAGNOSIS — Z7984 Long term (current) use of oral hypoglycemic drugs: Secondary | ICD-10-CM | POA: Diagnosis not present

## 2016-08-24 DIAGNOSIS — E119 Type 2 diabetes mellitus without complications: Secondary | ICD-10-CM | POA: Diagnosis not present

## 2016-08-26 DIAGNOSIS — R296 Repeated falls: Secondary | ICD-10-CM | POA: Diagnosis not present

## 2016-08-26 DIAGNOSIS — I4891 Unspecified atrial fibrillation: Secondary | ICD-10-CM | POA: Diagnosis not present

## 2016-08-26 DIAGNOSIS — R269 Unspecified abnormalities of gait and mobility: Secondary | ICD-10-CM | POA: Diagnosis not present

## 2016-08-26 DIAGNOSIS — R531 Weakness: Secondary | ICD-10-CM | POA: Diagnosis not present

## 2016-08-26 DIAGNOSIS — R600 Localized edema: Secondary | ICD-10-CM | POA: Diagnosis not present

## 2016-08-26 DIAGNOSIS — N400 Enlarged prostate without lower urinary tract symptoms: Secondary | ICD-10-CM | POA: Diagnosis not present

## 2016-08-26 DIAGNOSIS — E119 Type 2 diabetes mellitus without complications: Secondary | ICD-10-CM | POA: Diagnosis not present

## 2016-08-26 DIAGNOSIS — E039 Hypothyroidism, unspecified: Secondary | ICD-10-CM | POA: Diagnosis not present

## 2016-08-26 DIAGNOSIS — Z7984 Long term (current) use of oral hypoglycemic drugs: Secondary | ICD-10-CM | POA: Diagnosis not present

## 2016-08-27 DIAGNOSIS — E119 Type 2 diabetes mellitus without complications: Secondary | ICD-10-CM | POA: Diagnosis not present

## 2016-08-27 DIAGNOSIS — Z7984 Long term (current) use of oral hypoglycemic drugs: Secondary | ICD-10-CM | POA: Diagnosis not present

## 2016-08-27 DIAGNOSIS — R269 Unspecified abnormalities of gait and mobility: Secondary | ICD-10-CM | POA: Diagnosis not present

## 2016-08-27 DIAGNOSIS — R531 Weakness: Secondary | ICD-10-CM | POA: Diagnosis not present

## 2016-08-27 DIAGNOSIS — E039 Hypothyroidism, unspecified: Secondary | ICD-10-CM | POA: Diagnosis not present

## 2016-08-27 DIAGNOSIS — N400 Enlarged prostate without lower urinary tract symptoms: Secondary | ICD-10-CM | POA: Diagnosis not present

## 2016-08-27 DIAGNOSIS — R600 Localized edema: Secondary | ICD-10-CM | POA: Diagnosis not present

## 2016-08-27 DIAGNOSIS — R296 Repeated falls: Secondary | ICD-10-CM | POA: Diagnosis not present

## 2016-08-27 DIAGNOSIS — I4891 Unspecified atrial fibrillation: Secondary | ICD-10-CM | POA: Diagnosis not present

## 2016-08-28 ENCOUNTER — Encounter (HOSPITAL_COMMUNITY): Payer: Self-pay | Admitting: Emergency Medicine

## 2016-08-28 ENCOUNTER — Emergency Department (HOSPITAL_COMMUNITY): Payer: Medicare Other

## 2016-08-28 ENCOUNTER — Emergency Department (HOSPITAL_COMMUNITY)
Admission: EM | Admit: 2016-08-28 | Discharge: 2016-08-28 | Disposition: A | Discharge status: Home / Self Care | Payer: Medicare Other | Attending: Emergency Medicine | Admitting: Emergency Medicine

## 2016-08-28 DIAGNOSIS — S0181XA Laceration without foreign body of other part of head, initial encounter: Secondary | ICD-10-CM | POA: Insufficient documentation

## 2016-08-28 DIAGNOSIS — W19XXXA Unspecified fall, initial encounter: Secondary | ICD-10-CM | POA: Diagnosis not present

## 2016-08-28 DIAGNOSIS — E119 Type 2 diabetes mellitus without complications: Secondary | ICD-10-CM | POA: Insufficient documentation

## 2016-08-28 DIAGNOSIS — R2689 Other abnormalities of gait and mobility: Secondary | ICD-10-CM | POA: Insufficient documentation

## 2016-08-28 DIAGNOSIS — I10 Essential (primary) hypertension: Secondary | ICD-10-CM | POA: Diagnosis not present

## 2016-08-28 DIAGNOSIS — Y929 Unspecified place or not applicable: Secondary | ICD-10-CM | POA: Diagnosis not present

## 2016-08-28 DIAGNOSIS — R031 Nonspecific low blood-pressure reading: Secondary | ICD-10-CM | POA: Diagnosis not present

## 2016-08-28 DIAGNOSIS — S098XXA Other specified injuries of head, initial encounter: Secondary | ICD-10-CM | POA: Diagnosis not present

## 2016-08-28 DIAGNOSIS — S199XXA Unspecified injury of neck, initial encounter: Secondary | ICD-10-CM | POA: Diagnosis not present

## 2016-08-28 DIAGNOSIS — Y939 Activity, unspecified: Secondary | ICD-10-CM | POA: Diagnosis not present

## 2016-08-28 DIAGNOSIS — S0990XA Unspecified injury of head, initial encounter: Secondary | ICD-10-CM | POA: Diagnosis not present

## 2016-08-28 DIAGNOSIS — R262 Difficulty in walking, not elsewhere classified: Secondary | ICD-10-CM

## 2016-08-28 DIAGNOSIS — G8911 Acute pain due to trauma: Secondary | ICD-10-CM | POA: Diagnosis not present

## 2016-08-28 DIAGNOSIS — M542 Cervicalgia: Secondary | ICD-10-CM | POA: Diagnosis not present

## 2016-08-28 DIAGNOSIS — E039 Hypothyroidism, unspecified: Secondary | ICD-10-CM | POA: Insufficient documentation

## 2016-08-28 DIAGNOSIS — Y999 Unspecified external cause status: Secondary | ICD-10-CM | POA: Insufficient documentation

## 2016-08-28 DIAGNOSIS — R51 Headache: Secondary | ICD-10-CM | POA: Diagnosis not present

## 2016-08-28 LAB — CBC WITH DIFFERENTIAL/PLATELET
Basophils Absolute: 0 10*3/uL (ref 0.0–0.1)
Basophils Relative: 0 %
EOS ABS: 0.2 10*3/uL (ref 0.0–0.7)
Eosinophils Relative: 2 %
HCT: 31.2 % — ABNORMAL LOW (ref 39.0–52.0)
HEMOGLOBIN: 10.4 g/dL — AB (ref 13.0–17.0)
Lymphocytes Relative: 16 %
Lymphs Abs: 1.8 10*3/uL (ref 0.7–4.0)
MCH: 29.7 pg (ref 26.0–34.0)
MCHC: 33.3 g/dL (ref 30.0–36.0)
MCV: 89.1 fL (ref 78.0–100.0)
MONOS PCT: 9 %
Monocytes Absolute: 1 10*3/uL (ref 0.1–1.0)
Neutro Abs: 8.1 10*3/uL — ABNORMAL HIGH (ref 1.7–7.7)
Neutrophils Relative %: 73 %
Platelets: 263 10*3/uL (ref 150–400)
RBC: 3.5 MIL/uL — AB (ref 4.22–5.81)
RDW: 15.1 % (ref 11.5–15.5)
WBC: 11 10*3/uL — AB (ref 4.0–10.5)

## 2016-08-28 LAB — BASIC METABOLIC PANEL
ANION GAP: 8 (ref 5–15)
BUN: <5 mg/dL — ABNORMAL LOW (ref 6–20)
CALCIUM: 7.9 mg/dL — AB (ref 8.9–10.3)
CO2: 26 mmol/L (ref 22–32)
CREATININE: 0.91 mg/dL (ref 0.61–1.24)
Chloride: 101 mmol/L (ref 101–111)
Glucose, Bld: 221 mg/dL — ABNORMAL HIGH (ref 65–99)
Potassium: 3.4 mmol/L — ABNORMAL LOW (ref 3.5–5.1)
SODIUM: 135 mmol/L (ref 135–145)

## 2016-08-28 NOTE — ED Provider Notes (Signed)
[facility] DEPT Provider Note   CSN: [id number] Arrival date & time: 08/28/16  6387     History   Chief Complaint Chief Complaint  Patient presents with  . Fall   LEVEL 5 CAVEAT DUE TO DEMENTIA  HPI Joe [name] is a 83 y.o. male.  The history is provided by the patient. The history is limited by the condition of the patient.  Fall  This is a new problem. Episode onset: unknown. The problem occurs constantly. Associated symptoms include headaches. Nothing aggravates the symptoms. Nothing relieves the symptoms.  pt with h/o dementia, h/o atrial fibrillation on eliquis, presents s/p fall Apparently he was found in floor after a fall He was noted to have bruises and lacerations and sent for evaluation He lives at [facility] currently  Per EMS, pt was mildly hypotensive but responded to IV fluids Past Medical History:  Diagnosis Date  . Anxiety   . Atrial fibrillation with RVR ([place]) 08/28/2014   [name] 08/28/2014  . BPH (benign prostatic hypertrophy)    [name] 08/28/2014  . Hypertension    [name] 08/28/2014  . Hypothyroidism    [name] 08/28/2014  . Thyroid disease     Patient Active Problem List   Diagnosis Date Noted  . Adjustment disorder with mixed anxiety and depressed mood 11/13/2014  . Cognitive deficits 11/13/2014  . Syncope and collapse 11/10/2014  . Orthostatic hypotension 11/10/2014  . Hypothyroidism 08/28/2014  . Essential hypertension 08/28/2014  . Atrial fibrillation, new onset ([place]) 08/28/2014  . Dizziness 08/28/2014  . BPH (benign prostatic hypertrophy) 08/28/2014  . Diabetes mellitus with no complication ([place]) 56/43/3295  . Risky sexual behavior 08/28/2014  . A-fib ([place]) 08/28/2014  . Hypothyroid 09/10/2013  . BPH (benign prostatic hyperplasia) 09/10/2013    Past Surgical History:  Procedure Laterality Date  . PARS PLANA VITRECTOMY Left 03/30/2013   Procedure: PARS PLANA VITRECTOMY 25 GAUGE FOR ENDOPHTHALMITIS;  Surgeon: Hurman Horn,  MD;  Location: [facility];  Service: Ophthalmology;  Laterality: Left;  LOCAL RETROBULBAR,,  USE MARCAINE 0.5%       Home Medications    Prior to Admission medications   Medication Sig Start Date End Date Taking? Authorizing Provider  Alogliptin-Pioglitazone (OSENI) 25-15 MG TABS Take 1 tablet by mouth daily.    Historical Provider, MD  apixaban (ELIQUIS) 5 MG TABS tablet Take 1 tablet (5 mg total) by mouth 2 (two) times daily. 12/27/14   Robyn Haber, MD  cephALEXin (KEFLEX) 500 MG capsule Take 1 capsule (500 mg total) by mouth 4 (four) times daily. Patient not taking: Reported on 08/23/2016 08/16/16   Shawn C Joy, PA-C  ciprofloxacin (CILOXAN) 0.3 % ophthalmic solution Place 2 drops into both eyes every 4 (four) hours while awake. Administer 1 drop, every 2 hours, while awake, for 2 days. Then 1 drop, every 4 hours, while awake, for the next 5 days.    Historical Provider, MD  citalopram (CELEXA) 20 MG tablet Take 1 tablet (20 mg total) by mouth daily. Patient not taking: Reported on 08/06/2016 01/22/16   Wendie Agreste, MD  levothyroxine (SYNTHROID, LEVOTHROID) 100 MCG tablet Take 100 mcg by mouth daily before breakfast.    Historical Provider, MD  levothyroxine (SYNTHROID, LEVOTHROID) 125 MCG tablet Take 1 tablet (125 mcg total) by mouth daily before breakfast. Patient not taking: Reported on 08/23/2016 12/28/14   Robyn Haber, MD  LORazepam (ATIVAN) 0.5 MG tablet Take 1 tablet (0.5 mg total) by mouth 2 (two) times daily as needed for  anxiety. Patient taking differently: Take 0.5 mg by mouth every 8 (eight) hours as needed for anxiety.  07/28/16   Mancel Bale, PA-C  metoprolol tartrate (LOPRESSOR) 25 MG tablet Take 12.5 mg by mouth daily.     Historical Provider, MD  neomycin-polymyxin-pramoxine (NEOSPORIN PLUS) 1 % cream Apply topically 2 (two) times daily.    Historical Provider, MD  pioglitazone (ACTOS) 15 MG tablet Take 15 mg by mouth daily.    Historical Provider, MD  ramipril (ALTACE)  5 MG capsule Take 1 capsule (5 mg total) by mouth daily. Patient not taking: Reported on 08/23/2016 01/22/16   Wendie Agreste, MD  risperiDONE (RISPERDAL) 0.5 MG tablet Take 0.5 mg by mouth 2 (two) times daily.    Historical Provider, MD  tamsulosin (FLOMAX) 0.4 MG CAPS capsule Take 0.4 mg by mouth daily.    Historical Provider, MD    Family History Family History  Problem Relation Age of Onset  . Hyperlipidemia Sister     Social History Social History  Substance Use Topics  . Smoking status: Never Smoker  . Smokeless tobacco: Never Used  . Alcohol use No     Allergies   No known allergies   Review of Systems Review of Systems  Unable to perform ROS: Dementia  Neurological: Positive for headaches.     Physical Exam Updated Vital Signs BP 113/77   Pulse 93   Temp 97.9 F (36.6 C) (Oral)   Resp (!) 21   Ht 6' (1.829 m)   Wt 81.6 kg   SpO2 97%   BMI 24.41 kg/m   Physical Exam CONSTITUTIONAL: Elderly, no acute distress HEAD: dried blood noted to scalp, no stepoffs or deformities noted, laceration noted EYES: EOMI ENMT: Mucous membranes moist, old bruising noted to face, no obvious instability NECK: supple no meningeal signs SPINE/BACK:cervical spine tenderness, no thoracic/lumbar tenderness, No bruising/crepitance/stepoffs noted to spine CV: no loud murmurs noted LUNGS: Lungs are clear to auscultation bilaterally, no apparent distress Chest - no tenderness or bruising ABDOMEN: soft, nontender,no bruising noted NEURO: Pt is awake/alert he is mildly agitated and is confused.  He moves all extremitiesx4 EXTREMITIES: pulses normal/equal, full ROM, scattered skin tears/abrasions/bruises but no deformities, pelvis stable, All other extremities/joints palpated/ranged and nontender SKIN: warm, color normal, scattered bruising noted to extremities  ED Treatments / Results  Labs (all labs ordered are listed, but only abnormal results are displayed) Labs Reviewed    BASIC METABOLIC PANEL  CBC WITH DIFFERENTIAL/PLATELET    EKG  EKG Interpretation None       Radiology No results found.  Procedures Procedures (including critical care time)  Medications Ordered in ED Medications - No data to display   Initial Impression / Assessment and Plan / ED Course  I have reviewed the triage vital signs and the nursing notes.  Pertinent labs & imaging results that were available during my care of the patient were reviewed by me and considered in my medical decision making (see chart for details).    7:01 AM D/w staff at facility Found in floor after apparent fall at 4am (he was in bed 2 hrs before that on 2 hr rounds) It appeared he had lacerations He is on eliquis for afib No acute change in mental status, he is typically agitated/confused at baseline I advised it may need to be re-considered to take off eliquis due to age/fall risk  7:23 AM At signout to Dr Gilford Raid, f/u on imaging/labs He may need wound repair  Final Clinical Impressions(s) / ED Diagnoses   Final diagnoses:  None    New Prescriptions New Prescriptions   No medications on file     Ripley Fraise, MD 08/28/16 [phone]

## 2016-08-28 NOTE — ED Notes (Signed)
Pt returns from radiology. Remains on telel.

## 2016-08-28 NOTE — ED Notes (Signed)
Pt ambulated in hallway with stand by assist.

## 2016-08-28 NOTE — ED Notes (Signed)
Pt's wounds dressed with gauze. Agitated, refusing to ambulate.

## 2016-08-28 NOTE — ED Notes (Signed)
Patient transported to CT 

## 2016-08-28 NOTE — ED Triage Notes (Signed)
Pt in from [facility] via [facility] EMS after fall, takes Eliquis. Hx of multiple falls, old and new bruises and lacs present. Per facility, pt was found down in his room on their 2 hr rounding. L eye hematoma, L palm & R elbow skin tears, lac to R eyebrow and R posterior head. CBG was 259, BP was 88/40 for EMS, was given 500 ml's NS, repeat 100/62. Alert, hx of dementia

## 2016-08-28 NOTE — Discharge Instructions (Addendum)
PLEASE CONSIDER TAKING PATIENT OFF ELIQUIS AFTER DISCUSSING WITH PRIMARY DOCTOR AS HE IS HIGH FALL RISK

## 2016-08-28 NOTE — ED Provider Notes (Signed)
Pt signed out by Dr. Christy Gentles pending results of labs/CT scans.  No acute abnormalities.  No lacerations on body.  Multiple abrasions and skin tears. Pt is awake and alert.  He is able to ambulate with assistance to the bathroom.  He denies any new pain.  Pt is stable for d/c.   Isla Pence, MD 08/28/16 [phone]

## 2016-08-29 DIAGNOSIS — R269 Unspecified abnormalities of gait and mobility: Secondary | ICD-10-CM | POA: Diagnosis not present

## 2016-08-29 DIAGNOSIS — E039 Hypothyroidism, unspecified: Secondary | ICD-10-CM | POA: Diagnosis not present

## 2016-08-29 DIAGNOSIS — R531 Weakness: Secondary | ICD-10-CM | POA: Diagnosis not present

## 2016-08-29 DIAGNOSIS — E119 Type 2 diabetes mellitus without complications: Secondary | ICD-10-CM | POA: Diagnosis not present

## 2016-08-29 DIAGNOSIS — Z7984 Long term (current) use of oral hypoglycemic drugs: Secondary | ICD-10-CM | POA: Diagnosis not present

## 2016-08-29 DIAGNOSIS — R296 Repeated falls: Secondary | ICD-10-CM | POA: Diagnosis not present

## 2016-08-29 DIAGNOSIS — I4891 Unspecified atrial fibrillation: Secondary | ICD-10-CM | POA: Diagnosis not present

## 2016-08-29 DIAGNOSIS — N400 Enlarged prostate without lower urinary tract symptoms: Secondary | ICD-10-CM | POA: Diagnosis not present

## 2016-08-29 DIAGNOSIS — R600 Localized edema: Secondary | ICD-10-CM | POA: Diagnosis not present

## 2016-08-30 ENCOUNTER — Other Ambulatory Visit: Payer: Self-pay

## 2016-08-30 DIAGNOSIS — N400 Enlarged prostate without lower urinary tract symptoms: Secondary | ICD-10-CM | POA: Diagnosis not present

## 2016-08-30 DIAGNOSIS — R69 Illness, unspecified: Secondary | ICD-10-CM | POA: Diagnosis not present

## 2016-08-30 DIAGNOSIS — R600 Localized edema: Secondary | ICD-10-CM | POA: Diagnosis not present

## 2016-08-30 DIAGNOSIS — I4891 Unspecified atrial fibrillation: Secondary | ICD-10-CM | POA: Diagnosis not present

## 2016-08-30 DIAGNOSIS — R296 Repeated falls: Secondary | ICD-10-CM | POA: Diagnosis not present

## 2016-08-30 DIAGNOSIS — R269 Unspecified abnormalities of gait and mobility: Secondary | ICD-10-CM | POA: Diagnosis not present

## 2016-08-30 DIAGNOSIS — E119 Type 2 diabetes mellitus without complications: Secondary | ICD-10-CM | POA: Diagnosis not present

## 2016-08-30 DIAGNOSIS — Z7984 Long term (current) use of oral hypoglycemic drugs: Secondary | ICD-10-CM | POA: Diagnosis not present

## 2016-08-30 DIAGNOSIS — E039 Hypothyroidism, unspecified: Secondary | ICD-10-CM | POA: Diagnosis not present

## 2016-08-30 DIAGNOSIS — R531 Weakness: Secondary | ICD-10-CM | POA: Diagnosis not present

## 2016-08-30 NOTE — Patient Outreach (Signed)
[facility]) Care Management  08/30/2016  Joe [name] Aug 09, 1924 [id number]   Unsuccessful attempt to reach patient referred for screening from the [facility] list.  Neither phone number listed for this patient is a working number, so unable to leave a message.  Plan:  Refer patient back to assigned nurse Johny Shock, RN, for follow up.  Candie Mile, RN, MSN [place] [phone] Fax [phone]

## 2016-09-01 DIAGNOSIS — R296 Repeated falls: Secondary | ICD-10-CM | POA: Diagnosis not present

## 2016-09-01 DIAGNOSIS — I4891 Unspecified atrial fibrillation: Secondary | ICD-10-CM | POA: Diagnosis not present

## 2016-09-01 DIAGNOSIS — R269 Unspecified abnormalities of gait and mobility: Secondary | ICD-10-CM | POA: Diagnosis not present

## 2016-09-01 DIAGNOSIS — Z7984 Long term (current) use of oral hypoglycemic drugs: Secondary | ICD-10-CM | POA: Diagnosis not present

## 2016-09-01 DIAGNOSIS — E119 Type 2 diabetes mellitus without complications: Secondary | ICD-10-CM | POA: Diagnosis not present

## 2016-09-01 DIAGNOSIS — R531 Weakness: Secondary | ICD-10-CM | POA: Diagnosis not present

## 2016-09-01 DIAGNOSIS — N400 Enlarged prostate without lower urinary tract symptoms: Secondary | ICD-10-CM | POA: Diagnosis not present

## 2016-09-01 DIAGNOSIS — R600 Localized edema: Secondary | ICD-10-CM | POA: Diagnosis not present

## 2016-09-01 DIAGNOSIS — E039 Hypothyroidism, unspecified: Secondary | ICD-10-CM | POA: Diagnosis not present

## 2016-09-02 ENCOUNTER — Encounter: Payer: Self-pay | Admitting: *Deleted

## 2016-09-02 DIAGNOSIS — Z7984 Long term (current) use of oral hypoglycemic drugs: Secondary | ICD-10-CM | POA: Diagnosis not present

## 2016-09-02 DIAGNOSIS — I4891 Unspecified atrial fibrillation: Secondary | ICD-10-CM | POA: Diagnosis not present

## 2016-09-02 DIAGNOSIS — R296 Repeated falls: Secondary | ICD-10-CM | POA: Diagnosis not present

## 2016-09-02 DIAGNOSIS — E119 Type 2 diabetes mellitus without complications: Secondary | ICD-10-CM | POA: Diagnosis not present

## 2016-09-02 DIAGNOSIS — R531 Weakness: Secondary | ICD-10-CM | POA: Diagnosis not present

## 2016-09-02 DIAGNOSIS — N400 Enlarged prostate without lower urinary tract symptoms: Secondary | ICD-10-CM | POA: Diagnosis not present

## 2016-09-02 DIAGNOSIS — R269 Unspecified abnormalities of gait and mobility: Secondary | ICD-10-CM | POA: Diagnosis not present

## 2016-09-02 DIAGNOSIS — E039 Hypothyroidism, unspecified: Secondary | ICD-10-CM | POA: Diagnosis not present

## 2016-09-02 DIAGNOSIS — R600 Localized edema: Secondary | ICD-10-CM | POA: Diagnosis not present

## 2016-09-06 DIAGNOSIS — E039 Hypothyroidism, unspecified: Secondary | ICD-10-CM | POA: Diagnosis not present

## 2016-09-06 DIAGNOSIS — I4891 Unspecified atrial fibrillation: Secondary | ICD-10-CM | POA: Diagnosis not present

## 2016-09-06 DIAGNOSIS — R269 Unspecified abnormalities of gait and mobility: Secondary | ICD-10-CM | POA: Diagnosis not present

## 2016-09-06 DIAGNOSIS — Z7984 Long term (current) use of oral hypoglycemic drugs: Secondary | ICD-10-CM | POA: Diagnosis not present

## 2016-09-06 DIAGNOSIS — N400 Enlarged prostate without lower urinary tract symptoms: Secondary | ICD-10-CM | POA: Diagnosis not present

## 2016-09-06 DIAGNOSIS — E119 Type 2 diabetes mellitus without complications: Secondary | ICD-10-CM | POA: Diagnosis not present

## 2016-09-06 DIAGNOSIS — R600 Localized edema: Secondary | ICD-10-CM | POA: Diagnosis not present

## 2016-09-06 DIAGNOSIS — R531 Weakness: Secondary | ICD-10-CM | POA: Diagnosis not present

## 2016-09-06 DIAGNOSIS — R296 Repeated falls: Secondary | ICD-10-CM | POA: Diagnosis not present

## 2016-09-08 DIAGNOSIS — E119 Type 2 diabetes mellitus without complications: Secondary | ICD-10-CM | POA: Diagnosis not present

## 2016-09-08 DIAGNOSIS — N400 Enlarged prostate without lower urinary tract symptoms: Secondary | ICD-10-CM | POA: Diagnosis not present

## 2016-09-08 DIAGNOSIS — R531 Weakness: Secondary | ICD-10-CM | POA: Diagnosis not present

## 2016-09-08 DIAGNOSIS — E039 Hypothyroidism, unspecified: Secondary | ICD-10-CM | POA: Diagnosis not present

## 2016-09-08 DIAGNOSIS — Z7984 Long term (current) use of oral hypoglycemic drugs: Secondary | ICD-10-CM | POA: Diagnosis not present

## 2016-09-08 DIAGNOSIS — R600 Localized edema: Secondary | ICD-10-CM | POA: Diagnosis not present

## 2016-09-08 DIAGNOSIS — I4891 Unspecified atrial fibrillation: Secondary | ICD-10-CM | POA: Diagnosis not present

## 2016-09-08 DIAGNOSIS — R296 Repeated falls: Secondary | ICD-10-CM | POA: Diagnosis not present

## 2016-09-08 DIAGNOSIS — R269 Unspecified abnormalities of gait and mobility: Secondary | ICD-10-CM | POA: Diagnosis not present

## 2016-09-09 DIAGNOSIS — I4891 Unspecified atrial fibrillation: Secondary | ICD-10-CM | POA: Diagnosis not present

## 2016-09-09 DIAGNOSIS — R296 Repeated falls: Secondary | ICD-10-CM | POA: Diagnosis not present

## 2016-09-09 DIAGNOSIS — R531 Weakness: Secondary | ICD-10-CM | POA: Diagnosis not present

## 2016-09-09 DIAGNOSIS — E039 Hypothyroidism, unspecified: Secondary | ICD-10-CM | POA: Diagnosis not present

## 2016-09-09 DIAGNOSIS — Z7984 Long term (current) use of oral hypoglycemic drugs: Secondary | ICD-10-CM | POA: Diagnosis not present

## 2016-09-09 DIAGNOSIS — R600 Localized edema: Secondary | ICD-10-CM | POA: Diagnosis not present

## 2016-09-09 DIAGNOSIS — E119 Type 2 diabetes mellitus without complications: Secondary | ICD-10-CM | POA: Diagnosis not present

## 2016-09-09 DIAGNOSIS — R269 Unspecified abnormalities of gait and mobility: Secondary | ICD-10-CM | POA: Diagnosis not present

## 2016-09-09 DIAGNOSIS — N400 Enlarged prostate without lower urinary tract symptoms: Secondary | ICD-10-CM | POA: Diagnosis not present

## 2016-09-12 DIAGNOSIS — R296 Repeated falls: Secondary | ICD-10-CM | POA: Diagnosis not present

## 2016-09-12 DIAGNOSIS — I10 Essential (primary) hypertension: Secondary | ICD-10-CM | POA: Diagnosis not present

## 2016-09-13 DIAGNOSIS — R296 Repeated falls: Secondary | ICD-10-CM | POA: Diagnosis not present

## 2016-09-13 DIAGNOSIS — R600 Localized edema: Secondary | ICD-10-CM | POA: Diagnosis not present

## 2016-09-13 DIAGNOSIS — I4891 Unspecified atrial fibrillation: Secondary | ICD-10-CM | POA: Diagnosis not present

## 2016-09-13 DIAGNOSIS — N400 Enlarged prostate without lower urinary tract symptoms: Secondary | ICD-10-CM | POA: Diagnosis not present

## 2016-09-13 DIAGNOSIS — R531 Weakness: Secondary | ICD-10-CM | POA: Diagnosis not present

## 2016-09-13 DIAGNOSIS — E039 Hypothyroidism, unspecified: Secondary | ICD-10-CM | POA: Diagnosis not present

## 2016-09-13 DIAGNOSIS — Z7984 Long term (current) use of oral hypoglycemic drugs: Secondary | ICD-10-CM | POA: Diagnosis not present

## 2016-09-13 DIAGNOSIS — R269 Unspecified abnormalities of gait and mobility: Secondary | ICD-10-CM | POA: Diagnosis not present

## 2016-09-13 DIAGNOSIS — E119 Type 2 diabetes mellitus without complications: Secondary | ICD-10-CM | POA: Diagnosis not present

## 2016-09-15 DIAGNOSIS — R600 Localized edema: Secondary | ICD-10-CM | POA: Diagnosis not present

## 2016-09-15 DIAGNOSIS — R296 Repeated falls: Secondary | ICD-10-CM | POA: Diagnosis not present

## 2016-09-15 DIAGNOSIS — N400 Enlarged prostate without lower urinary tract symptoms: Secondary | ICD-10-CM | POA: Diagnosis not present

## 2016-09-15 DIAGNOSIS — I4891 Unspecified atrial fibrillation: Secondary | ICD-10-CM | POA: Diagnosis not present

## 2016-09-15 DIAGNOSIS — R269 Unspecified abnormalities of gait and mobility: Secondary | ICD-10-CM | POA: Diagnosis not present

## 2016-09-15 DIAGNOSIS — E039 Hypothyroidism, unspecified: Secondary | ICD-10-CM | POA: Diagnosis not present

## 2016-09-15 DIAGNOSIS — E119 Type 2 diabetes mellitus without complications: Secondary | ICD-10-CM | POA: Diagnosis not present

## 2016-09-15 DIAGNOSIS — R531 Weakness: Secondary | ICD-10-CM | POA: Diagnosis not present

## 2016-09-15 DIAGNOSIS — Z7984 Long term (current) use of oral hypoglycemic drugs: Secondary | ICD-10-CM | POA: Diagnosis not present

## 2016-09-16 DIAGNOSIS — E119 Type 2 diabetes mellitus without complications: Secondary | ICD-10-CM | POA: Diagnosis not present

## 2016-09-16 DIAGNOSIS — R531 Weakness: Secondary | ICD-10-CM | POA: Diagnosis not present

## 2016-09-16 DIAGNOSIS — I4891 Unspecified atrial fibrillation: Secondary | ICD-10-CM | POA: Diagnosis not present

## 2016-09-16 DIAGNOSIS — N400 Enlarged prostate without lower urinary tract symptoms: Secondary | ICD-10-CM | POA: Diagnosis not present

## 2016-09-16 DIAGNOSIS — R600 Localized edema: Secondary | ICD-10-CM | POA: Diagnosis not present

## 2016-09-16 DIAGNOSIS — E039 Hypothyroidism, unspecified: Secondary | ICD-10-CM | POA: Diagnosis not present

## 2016-09-16 DIAGNOSIS — Z7984 Long term (current) use of oral hypoglycemic drugs: Secondary | ICD-10-CM | POA: Diagnosis not present

## 2016-09-16 DIAGNOSIS — R296 Repeated falls: Secondary | ICD-10-CM | POA: Diagnosis not present

## 2016-09-16 DIAGNOSIS — R269 Unspecified abnormalities of gait and mobility: Secondary | ICD-10-CM | POA: Diagnosis not present

## 2016-09-19 DIAGNOSIS — R296 Repeated falls: Secondary | ICD-10-CM | POA: Diagnosis not present

## 2016-09-19 DIAGNOSIS — I4891 Unspecified atrial fibrillation: Secondary | ICD-10-CM | POA: Diagnosis not present

## 2016-09-19 DIAGNOSIS — Z7984 Long term (current) use of oral hypoglycemic drugs: Secondary | ICD-10-CM | POA: Diagnosis not present

## 2016-09-19 DIAGNOSIS — R531 Weakness: Secondary | ICD-10-CM | POA: Diagnosis not present

## 2016-09-19 DIAGNOSIS — E039 Hypothyroidism, unspecified: Secondary | ICD-10-CM | POA: Diagnosis not present

## 2016-09-19 DIAGNOSIS — R269 Unspecified abnormalities of gait and mobility: Secondary | ICD-10-CM | POA: Diagnosis not present

## 2016-09-19 DIAGNOSIS — R600 Localized edema: Secondary | ICD-10-CM | POA: Diagnosis not present

## 2016-09-19 DIAGNOSIS — N400 Enlarged prostate without lower urinary tract symptoms: Secondary | ICD-10-CM | POA: Diagnosis not present

## 2016-09-19 DIAGNOSIS — E119 Type 2 diabetes mellitus without complications: Secondary | ICD-10-CM | POA: Diagnosis not present

## 2016-09-20 ENCOUNTER — Other Ambulatory Visit: Payer: Self-pay

## 2016-09-20 NOTE — Patient Outreach (Signed)
[facility]) Care Management  09/20/2016  [name] February 25, 1925 [id number]   Medication Adherence to Mr. Joe [name] patient is at a nursing home reason for the call is because he is showing he is past due on ramipril 1.25 mg ,spoke with patients nurse she said patient is no longer on ramipril  1.25 mg any longer he was take off the medication per doctor Maxwell Caul .    [facility] Management Direct Dial [phone]  Fax [phone] [email or web address]

## 2016-09-22 DIAGNOSIS — R531 Weakness: Secondary | ICD-10-CM | POA: Diagnosis not present

## 2016-09-22 DIAGNOSIS — R296 Repeated falls: Secondary | ICD-10-CM | POA: Diagnosis not present

## 2016-09-22 DIAGNOSIS — E119 Type 2 diabetes mellitus without complications: Secondary | ICD-10-CM | POA: Diagnosis not present

## 2016-09-22 DIAGNOSIS — R600 Localized edema: Secondary | ICD-10-CM | POA: Diagnosis not present

## 2016-09-22 DIAGNOSIS — E039 Hypothyroidism, unspecified: Secondary | ICD-10-CM | POA: Diagnosis not present

## 2016-09-22 DIAGNOSIS — N400 Enlarged prostate without lower urinary tract symptoms: Secondary | ICD-10-CM | POA: Diagnosis not present

## 2016-09-22 DIAGNOSIS — Z7984 Long term (current) use of oral hypoglycemic drugs: Secondary | ICD-10-CM | POA: Diagnosis not present

## 2016-09-22 DIAGNOSIS — I4891 Unspecified atrial fibrillation: Secondary | ICD-10-CM | POA: Diagnosis not present

## 2016-09-22 DIAGNOSIS — R269 Unspecified abnormalities of gait and mobility: Secondary | ICD-10-CM | POA: Diagnosis not present

## 2016-09-27 DIAGNOSIS — N400 Enlarged prostate without lower urinary tract symptoms: Secondary | ICD-10-CM | POA: Diagnosis not present

## 2016-09-27 DIAGNOSIS — R531 Weakness: Secondary | ICD-10-CM | POA: Diagnosis not present

## 2016-09-27 DIAGNOSIS — E119 Type 2 diabetes mellitus without complications: Secondary | ICD-10-CM | POA: Diagnosis not present

## 2016-09-27 DIAGNOSIS — R296 Repeated falls: Secondary | ICD-10-CM | POA: Diagnosis not present

## 2016-09-27 DIAGNOSIS — E039 Hypothyroidism, unspecified: Secondary | ICD-10-CM | POA: Diagnosis not present

## 2016-09-27 DIAGNOSIS — R269 Unspecified abnormalities of gait and mobility: Secondary | ICD-10-CM | POA: Diagnosis not present

## 2016-09-27 DIAGNOSIS — I4891 Unspecified atrial fibrillation: Secondary | ICD-10-CM | POA: Diagnosis not present

## 2016-09-27 DIAGNOSIS — R600 Localized edema: Secondary | ICD-10-CM | POA: Diagnosis not present

## 2016-09-27 DIAGNOSIS — Z7984 Long term (current) use of oral hypoglycemic drugs: Secondary | ICD-10-CM | POA: Diagnosis not present

## 2016-09-30 ENCOUNTER — Emergency Department (HOSPITAL_COMMUNITY)
Admission: EM | Admit: 2016-09-30 | Discharge: 2016-10-01 | Disposition: A | Discharge status: Home / Self Care | Payer: Medicare Other | Source: Home / Self Care | Attending: Emergency Medicine | Admitting: Emergency Medicine

## 2016-09-30 ENCOUNTER — Encounter (HOSPITAL_COMMUNITY): Payer: Self-pay | Admitting: Emergency Medicine

## 2016-09-30 DIAGNOSIS — R600 Localized edema: Secondary | ICD-10-CM | POA: Diagnosis not present

## 2016-09-30 DIAGNOSIS — Z7984 Long term (current) use of oral hypoglycemic drugs: Secondary | ICD-10-CM | POA: Diagnosis not present

## 2016-09-30 DIAGNOSIS — W19XXXA Unspecified fall, initial encounter: Secondary | ICD-10-CM | POA: Diagnosis not present

## 2016-09-30 DIAGNOSIS — E119 Type 2 diabetes mellitus without complications: Secondary | ICD-10-CM | POA: Diagnosis not present

## 2016-09-30 DIAGNOSIS — E039 Hypothyroidism, unspecified: Secondary | ICD-10-CM | POA: Insufficient documentation

## 2016-09-30 DIAGNOSIS — S199XXA Unspecified injury of neck, initial encounter: Secondary | ICD-10-CM | POA: Diagnosis not present

## 2016-09-30 DIAGNOSIS — Y999 Unspecified external cause status: Secondary | ICD-10-CM | POA: Insufficient documentation

## 2016-09-30 DIAGNOSIS — Z79899 Other long term (current) drug therapy: Secondary | ICD-10-CM | POA: Insufficient documentation

## 2016-09-30 DIAGNOSIS — S61411A Laceration without foreign body of right hand, initial encounter: Secondary | ICD-10-CM | POA: Insufficient documentation

## 2016-09-30 DIAGNOSIS — T148XXA Other injury of unspecified body region, initial encounter: Secondary | ICD-10-CM | POA: Diagnosis not present

## 2016-09-30 DIAGNOSIS — Y929 Unspecified place or not applicable: Secondary | ICD-10-CM

## 2016-09-30 DIAGNOSIS — S0101XA Laceration without foreign body of scalp, initial encounter: Secondary | ICD-10-CM | POA: Diagnosis not present

## 2016-09-30 DIAGNOSIS — Y939 Activity, unspecified: Secondary | ICD-10-CM | POA: Insufficient documentation

## 2016-09-30 DIAGNOSIS — S01301A Unspecified open wound of right ear, initial encounter: Secondary | ICD-10-CM | POA: Diagnosis not present

## 2016-09-30 DIAGNOSIS — Z7902 Long term (current) use of antithrombotics/antiplatelets: Secondary | ICD-10-CM | POA: Diagnosis not present

## 2016-09-30 DIAGNOSIS — Z7901 Long term (current) use of anticoagulants: Secondary | ICD-10-CM | POA: Insufficient documentation

## 2016-09-30 DIAGNOSIS — R296 Repeated falls: Secondary | ICD-10-CM | POA: Diagnosis not present

## 2016-09-30 DIAGNOSIS — W07XXXA Fall from chair, initial encounter: Secondary | ICD-10-CM | POA: Insufficient documentation

## 2016-09-30 DIAGNOSIS — I10 Essential (primary) hypertension: Secondary | ICD-10-CM | POA: Insufficient documentation

## 2016-09-30 DIAGNOSIS — S299XXA Unspecified injury of thorax, initial encounter: Secondary | ICD-10-CM | POA: Diagnosis not present

## 2016-09-30 DIAGNOSIS — N400 Enlarged prostate without lower urinary tract symptoms: Secondary | ICD-10-CM | POA: Diagnosis not present

## 2016-09-30 DIAGNOSIS — S61401A Unspecified open wound of right hand, initial encounter: Secondary | ICD-10-CM | POA: Diagnosis not present

## 2016-09-30 DIAGNOSIS — R531 Weakness: Secondary | ICD-10-CM | POA: Diagnosis not present

## 2016-09-30 DIAGNOSIS — S0990XA Unspecified injury of head, initial encounter: Secondary | ICD-10-CM | POA: Diagnosis not present

## 2016-09-30 DIAGNOSIS — R269 Unspecified abnormalities of gait and mobility: Secondary | ICD-10-CM | POA: Diagnosis not present

## 2016-09-30 DIAGNOSIS — I4891 Unspecified atrial fibrillation: Secondary | ICD-10-CM | POA: Diagnosis not present

## 2016-09-30 MED ORDER — BACITRACIN ZINC 500 UNIT/GM EX OINT
TOPICAL_OINTMENT | CUTANEOUS | Status: AC
  Administered 2016-09-30: 1
  Filled 2016-09-30: qty 0.9

## 2016-09-30 NOTE — ED Triage Notes (Signed)
Per EMS pt from [facility] sent for evaluation post fall; has right hand and wrist skin tears. Pt denies LOC. Pt disoriented to time per normal.

## 2016-09-30 NOTE — Discharge Instructions (Signed)
Keep wounds clean and covered.

## 2016-09-30 NOTE — ED Notes (Signed)
Bacitracin applied, wound cleaned and dressing applied.

## 2016-09-30 NOTE — ED Notes (Signed)
Bed: [facility]ALD Expected date:  Expected time:  Means of arrival:  Comments: No bed  

## 2016-09-30 NOTE — ED Provider Notes (Signed)
[facility] DEPT Provider Note   CSN: [id number] Arrival date & time: 09/30/16  1708     History   Chief Complaint Chief Complaint  Patient presents with  . Fall    HPI Joe [name] is a 83 y.o. male. Chief complaint is fall.  HPI:  24 -year-old male with history of dementia.  Chief complaint is fall from a chair at his care facility. Witnessed fall. Skin tears on his hand from touching it against the handle of his wheelchair. No other symptoms or complaints per EMS report from facility. Strict bed. No loss of consciousness.  Past Medical History:  Diagnosis Date  . Anxiety   . Atrial fibrillation with RVR ([place]) 08/28/2014   [name] 08/28/2014  . BPH (benign prostatic hypertrophy)    [name] 08/28/2014  . Hypertension    [name] 08/28/2014  . Hypothyroidism    [name] 08/28/2014  . Thyroid disease     Patient Active Problem List   Diagnosis Date Noted  . Adjustment disorder with mixed anxiety and depressed mood 11/13/2014  . Cognitive deficits 11/13/2014  . Syncope and collapse 11/10/2014  . Orthostatic hypotension 11/10/2014  . Hypothyroidism 08/28/2014  . Essential hypertension 08/28/2014  . Atrial fibrillation, new onset ([place]) 08/28/2014  . Dizziness 08/28/2014  . BPH (benign prostatic hypertrophy) 08/28/2014  . Diabetes mellitus with no complication ([place]) 27/10/2374  . Risky sexual behavior 08/28/2014  . A-fib ([place]) 08/28/2014  . Hypothyroid 09/10/2013  . BPH (benign prostatic hyperplasia) 09/10/2013    Past Surgical History:  Procedure Laterality Date  . PARS PLANA VITRECTOMY Left 03/30/2013   Procedure: PARS PLANA VITRECTOMY 25 GAUGE FOR ENDOPHTHALMITIS;  Surgeon: Hurman Horn, MD;  Location: [facility];  Service: Ophthalmology;  Laterality: Left;  LOCAL RETROBULBAR,,  USE MARCAINE 0.5%       Home Medications    Prior to Admission medications   Medication Sig Start Date End Date Taking? Authorizing Provider  Alogliptin-Pioglitazone (OSENI) 25-15 MG TABS  Take 1 tablet by mouth daily.    [provider]  apixaban (ELIQUIS) 5 MG TABS tablet Take 1 tablet (5 mg total) by mouth 2 (two) times daily. 12/27/14   Robyn Haber, MD  cephALEXin (KEFLEX) 500 MG capsule Take 1 capsule (500 mg total) by mouth 4 (four) times daily. Patient not taking: Reported on 08/23/2016 08/16/16   Arlean Hopping C, PA-C  citalopram (CELEXA) 20 MG tablet Take 1 tablet (20 mg total) by mouth daily. Patient not taking: Reported on 08/06/2016 01/22/16   Wendie Agreste, MD  ketoconazole (NIZORAL) 2 % shampoo Apply 1 application topically 3 (three) times a week.    [provider]  levothyroxine (SYNTHROID, LEVOTHROID) 100 MCG tablet Take 100 mcg by mouth daily before breakfast.    [provider]  levothyroxine (SYNTHROID, LEVOTHROID) 125 MCG tablet Take 1 tablet (125 mcg total) by mouth daily before breakfast. Patient not taking: Reported on 08/23/2016 12/28/14   Robyn Haber, MD  LORazepam (ATIVAN) 0.5 MG tablet Take 1 tablet (0.5 mg total) by mouth 2 (two) times daily as needed for anxiety. Patient taking differently: Take 0.5 mg by mouth daily. Pt takes once daily at 1600, may also take three times daily as needed for anxiety 07/28/16   Weber, Sarah L, PA-C  metoprolol tartrate (LOPRESSOR) 25 MG tablet Take 12.5 mg by mouth 2 (two) times daily.    [provider]  neomycin-bacitracin-polymyxin (NEOSPORIN) [phone] ointment Apply 1 application topically 2 (two) times daily. To right ear  [provider]  QUEtiapine (SEROQUEL) 50 MG tablet Take 50 mg by mouth at bedtime.    [provider]  ramipril (ALTACE) 1.25 MG capsule Take 1.25 mg by mouth daily.    [provider]  ramipril (ALTACE) 5 MG capsule Take 1 capsule (5 mg total) by mouth daily. Patient not taking: Reported on 08/23/2016 01/22/16   Wendie Agreste, MD  tamsulosin (FLOMAX) 0.4 MG CAPS capsule Take 0.4 mg by mouth daily.    [provider]    TRADJENTA 5 MG TABS tablet Take 5 mg by mouth daily. 08/26/16   [provider]    Family History Family History  Problem Relation Age of Onset  . Hyperlipidemia Sister     Social History Social History  Substance Use Topics  . Smoking status: Never Smoker  . Smokeless tobacco: Never Used  . Alcohol use No     Allergies   No known allergies   Review of Systems Review of Systems  Unable to perform ROS: Dementia   Level V caveat  Physical Exam Updated Vital Signs BP 116/79 (BP Location: Left Arm)   Pulse 94   Temp 98 F (36.7 C) (Oral)   Resp 18   SpO2 94%   Physical Exam  Constitutional: He is oriented to person, place, and time. He appears well-developed and well-nourished. No distress.  Nontender over the scalp school. No complaint of pain midline neck  HENT:  Head: Normocephalic.  Eyes: Conjunctivae are normal. Pupils are equal, round, and reactive to light. No scleral icterus.  Neck: Normal range of motion. Neck supple. No thyromegaly present.  Cardiovascular: Normal rate and regular rhythm.  Exam reveals no gallop and no friction rub.   No murmur heard. Pulmonary/Chest: Effort normal and breath sounds normal. No respiratory distress. He has no wheezes. He has no rales.  Abdominal: Soft. Bowel sounds are normal. He exhibits no distension. There is no tenderness. There is no rebound.  Musculoskeletal: Normal range of motion.  Neurological: He is alert and oriented to person, place, and time.  Skin: Skin is warm and dry. No rash noted.  Skin tears right hand. Full range of motion wrist and hand.  Psychiatric: He has a normal mood and affect. His behavior is normal.     ED Treatments / Results  Labs (all labs ordered are listed, but only abnormal results are displayed) Labs Reviewed - No data to display  EKG  EKG Interpretation None       Radiology No results found.  Procedures Procedures (including critical care time)  Medications  Ordered in ED Medications - No data to display   Initial Impression / Assessment and Plan / ED Course  I have reviewed the triage vital signs and the nursing notes.  Pertinent labs & imaging results that were available during my care of the patient were reviewed by me and considered in my medical decision making (see chart for details).       No change from his baseline status. No indication for imaging or testing.  Final Clinical Impressions(s) / ED Diagnoses   Final diagnoses:  Fall, initial encounter  Skin tear of right hand without complication, initial encounter    New Prescriptions New Prescriptions   No medications on file     Tanna Furry, MD 09/30/16 1851

## 2016-09-30 NOTE — ED Notes (Signed)
Pt ambulated to restroom w/wheelchair.  Waiting on PTAR.

## 2016-10-01 ENCOUNTER — Encounter (HOSPITAL_COMMUNITY): Payer: Self-pay | Admitting: Emergency Medicine

## 2016-10-01 ENCOUNTER — Emergency Department (HOSPITAL_COMMUNITY)
Admission: EM | Admit: 2016-10-01 | Discharge: 2016-10-01 | Disposition: A | Discharge status: Home / Self Care | Payer: Medicare Other | Attending: Emergency Medicine | Admitting: Emergency Medicine

## 2016-10-01 ENCOUNTER — Emergency Department (HOSPITAL_COMMUNITY): Payer: Medicare Other

## 2016-10-01 DIAGNOSIS — Z7984 Long term (current) use of oral hypoglycemic drugs: Secondary | ICD-10-CM | POA: Insufficient documentation

## 2016-10-01 DIAGNOSIS — S61012D Laceration without foreign body of left thumb without damage to nail, subsequent encounter: Secondary | ICD-10-CM | POA: Diagnosis not present

## 2016-10-01 DIAGNOSIS — S01301A Unspecified open wound of right ear, initial encounter: Secondary | ICD-10-CM | POA: Insufficient documentation

## 2016-10-01 DIAGNOSIS — S199XXA Unspecified injury of neck, initial encounter: Secondary | ICD-10-CM | POA: Diagnosis not present

## 2016-10-01 DIAGNOSIS — S0101XA Laceration without foreign body of scalp, initial encounter: Secondary | ICD-10-CM | POA: Diagnosis not present

## 2016-10-01 DIAGNOSIS — W19XXXA Unspecified fall, initial encounter: Secondary | ICD-10-CM | POA: Insufficient documentation

## 2016-10-01 DIAGNOSIS — Y999 Unspecified external cause status: Secondary | ICD-10-CM | POA: Insufficient documentation

## 2016-10-01 DIAGNOSIS — Y939 Activity, unspecified: Secondary | ICD-10-CM | POA: Insufficient documentation

## 2016-10-01 DIAGNOSIS — S61411A Laceration without foreign body of right hand, initial encounter: Secondary | ICD-10-CM | POA: Insufficient documentation

## 2016-10-01 DIAGNOSIS — S299XXA Unspecified injury of thorax, initial encounter: Secondary | ICD-10-CM | POA: Diagnosis not present

## 2016-10-01 DIAGNOSIS — R2689 Other abnormalities of gait and mobility: Secondary | ICD-10-CM | POA: Diagnosis not present

## 2016-10-01 DIAGNOSIS — E119 Type 2 diabetes mellitus without complications: Secondary | ICD-10-CM | POA: Insufficient documentation

## 2016-10-01 DIAGNOSIS — Y929 Unspecified place or not applicable: Secondary | ICD-10-CM | POA: Insufficient documentation

## 2016-10-01 DIAGNOSIS — R531 Weakness: Secondary | ICD-10-CM | POA: Diagnosis not present

## 2016-10-01 DIAGNOSIS — S0181XA Laceration without foreign body of other part of head, initial encounter: Secondary | ICD-10-CM | POA: Diagnosis not present

## 2016-10-01 DIAGNOSIS — S098XXA Other specified injuries of head, initial encounter: Secondary | ICD-10-CM | POA: Diagnosis not present

## 2016-10-01 DIAGNOSIS — E039 Hypothyroidism, unspecified: Secondary | ICD-10-CM | POA: Insufficient documentation

## 2016-10-01 DIAGNOSIS — Z7902 Long term (current) use of antithrombotics/antiplatelets: Secondary | ICD-10-CM | POA: Insufficient documentation

## 2016-10-01 DIAGNOSIS — Z79899 Other long term (current) drug therapy: Secondary | ICD-10-CM | POA: Insufficient documentation

## 2016-10-01 DIAGNOSIS — I10 Essential (primary) hypertension: Secondary | ICD-10-CM | POA: Insufficient documentation

## 2016-10-01 DIAGNOSIS — S0990XA Unspecified injury of head, initial encounter: Secondary | ICD-10-CM | POA: Diagnosis not present

## 2016-10-01 HISTORY — DX: Unspecified dementia, unspecified severity, without behavioral disturbance, psychotic disturbance, mood disturbance, and anxiety: F03.90

## 2016-10-01 MED ORDER — LIDOCAINE-EPINEPHRINE (PF) 2 %-1:200000 IJ SOLN
10.0000 mL | Freq: Once | INTRAMUSCULAR | Status: AC
  Administered 2016-10-01: 10 mL
  Filled 2016-10-01: qty 20

## 2016-10-01 NOTE — ED Notes (Signed)
ED Provider at bedside. 

## 2016-10-01 NOTE — ED Notes (Signed)
PTAR here to transport patient back to facility. Patient remains loud and cursing staff, attempting to strike and kick staff. Patient incontinent of urine-patient cleaned with new brief and paper scrub pants.

## 2016-10-01 NOTE — ED Provider Notes (Signed)
[facility] DEPT Provider Note   CSN: [id number] Arrival date & time: 10/01/16  1252     History   Chief Complaint Chief Complaint  Patient presents with  . Fall  . Ear Injury    HPI Joe [name] is a 83 y.o. male.  83 year old male with past medical history including dementia, hypertension, atrial fibrillation who presents with fall and right ear injury. The patient was evaluated here 12 hours ago and had fallen, sustaining skin tears to his right hand. He was discharged back to his facility and this afternoon had another unwitnessed fall and was noted to have injury to his right ear and behind his right ear. He is at his neurologic baseline according to staff and is hard of hearing.  Patient denies any complaints to me.  LEVEL 5 CAVEAT DUE TO DEMENTIA   The history is provided by the EMS personnel.  Fall     Past Medical History:  Diagnosis Date  . Anxiety   . Atrial fibrillation with RVR ([place]) 08/28/2014   [name] 08/28/2014  . BPH (benign prostatic hypertrophy)    [name] 08/28/2014  . Dementia   . Hypertension    [name] 08/28/2014  . Hypothyroidism    [name] 08/28/2014  . Thyroid disease     Patient Active Problem List   Diagnosis Date Noted  . Adjustment disorder with mixed anxiety and depressed mood 11/13/2014  . Cognitive deficits 11/13/2014  . Syncope and collapse 11/10/2014  . Orthostatic hypotension 11/10/2014  . Hypothyroidism 08/28/2014  . Essential hypertension 08/28/2014  . Atrial fibrillation, new onset ([place]) 08/28/2014  . Dizziness 08/28/2014  . BPH (benign prostatic hypertrophy) 08/28/2014  . Diabetes mellitus with no complication ([place]) 97/06/6376  . Risky sexual behavior 08/28/2014  . A-fib ([place]) 08/28/2014  . Hypothyroid 09/10/2013  . BPH (benign prostatic hyperplasia) 09/10/2013    Past Surgical History:  Procedure Laterality Date  . PARS PLANA VITRECTOMY Left 03/30/2013   Procedure: PARS PLANA VITRECTOMY 25 GAUGE FOR  ENDOPHTHALMITIS;  Surgeon: Hurman Horn, MD;  Location: [facility];  Service: Ophthalmology;  Laterality: Left;  LOCAL RETROBULBAR,,  USE MARCAINE 0.5%       Home Medications    Prior to Admission medications   Medication Sig Start Date End Date Taking? Authorizing Provider  ketoconazole (NIZORAL) 2 % shampoo Apply 1 application topically 3 (three) times a week.   Yes [provider]  levothyroxine (SYNTHROID, LEVOTHROID) 100 MCG tablet Take 100 mcg by mouth daily before breakfast.   Yes [provider]  LORazepam (ATIVAN) 0.5 MG tablet Take 5 mg by mouth every 6 (six) hours as needed for anxiety. 08/24/16  Yes [provider]  metoprolol tartrate (LOPRESSOR) 25 MG tablet Take 12.5 mg by mouth 2 (two) times daily.   Yes [provider]  neomycin-bacitracin-polymyxin (NEOSPORIN) [phone] ointment Apply 1 application topically 2 (two) times daily. To right ear   Yes [provider]  QUEtiapine (SEROQUEL) 50 MG tablet Take 50 mg by mouth 2 (two) times daily.    Yes [provider]  tamsulosin (FLOMAX) 0.4 MG CAPS capsule Take 0.4 mg by mouth daily.   Yes [provider]  TRADJENTA 5 MG TABS tablet Take 5 mg by mouth daily. 08/26/16  Yes [provider]  apixaban (ELIQUIS) 5 MG TABS tablet Take 1 tablet (5 mg total) by mouth 2 (two) times daily. Patient not taking: Reported on 10/01/2016 12/27/14   Robyn Haber, MD    Family History Family  History  Problem Relation Age of Onset  . Hyperlipidemia Sister     Social History Social History  Substance Use Topics  . Smoking status: Never Smoker  . Smokeless tobacco: Never Used  . Alcohol use No     Allergies   No known allergies   Review of Systems Review of Systems  Unable to perform ROS: Dementia     Physical Exam Updated Vital Signs BP 100/63 (BP Location: Right Arm)   Pulse 91   Temp 98.5 F (36.9 C) (Oral)   Resp 14   SpO2 98%   Physical Exam    Constitutional: He appears well-developed. No distress.  Frail, elderly man resting comfortably  HENT:  Head: Normocephalic.    Ears:  Moist mucous membranes  Eyes: Conjunctivae are normal. Pupils are equal, round, and reactive to light.  Neck: Neck supple.  Cardiovascular: Normal rate, regular rhythm and normal heart sounds.   No murmur heard. Pulmonary/Chest: Effort normal and breath sounds normal.  Abdominal: Soft. Bowel sounds are normal. He exhibits no distension. There is no tenderness.  Musculoskeletal: He exhibits no edema.  Neurological: He is alert. He exhibits normal muscle tone.  Fluent speech  Skin: Skin is warm and dry.  Old skin tears L dorsal hand and L elbow; bandages over new skin tears on palmar R hand;1.5 cm laceration behind R ear; punctate avulsion to center of R external ear  Nursing note and vitals reviewed.    ED Treatments / Results  Labs (all labs ordered are listed, but only abnormal results are displayed) Labs Reviewed - No data to display  EKG  EKG Interpretation None       Radiology Ct Head Wo Contrast  Result Date: 10/01/2016 CLINICAL DATA:  83 year old male with fall and altered mental status appear EXAM: CT HEAD WITHOUT CONTRAST CT CERVICAL SPINE WITHOUT CONTRAST TECHNIQUE: Multidetector CT imaging of the head and cervical spine was performed following the standard protocol without intravenous contrast. Multiplanar CT image reconstructions of the cervical spine were also generated. COMPARISON:  08/28/2016 and prior CTs FINDINGS: CT HEAD FINDINGS Brain: No evidence of acute infarction, hemorrhage, hydrocephalus, extra-axial collection or mass lesion/mass effect. Atrophy and chronic small-vessel white matter ischemic changes again noted. Vascular: Intracranial atherosclerotic calcifications noted. Skull: Normal. Negative for fracture or focal lesion. Sinuses/Orbits: No acute finding. Other: None. CT CERVICAL SPINE FINDINGS Alignment: No acute  subluxation. Straightening of the normal cervical lordosis noted. 2 mm anterolisthesis of C6 on C7 is unchanged. Skull base and vertebrae: No acute fracture. No focal bony lesions identified. Soft tissues and spinal canal: No prevertebral fluid or swelling. No visible canal hematoma. Disc levels: Mild to moderate multilevel degenerative disc disease, spondylosis and facet arthropathy again noted. Upper chest: Negative. Other: None IMPRESSION: No evidence of acute intracranial abnormality. Atrophy and chronic small-vessel white matter ischemic changes. No static evidence of acute injury to the cervical spine. Electronically Signed   By: Margarette Canada M.D.   On: 10/01/2016 14:01   Ct Cervical Spine Wo Contrast  Result Date: 10/01/2016 CLINICAL DATA:  83 year old male with fall and altered mental status appear EXAM: CT HEAD WITHOUT CONTRAST CT CERVICAL SPINE WITHOUT CONTRAST TECHNIQUE: Multidetector CT imaging of the head and cervical spine was performed following the standard protocol without intravenous contrast. Multiplanar CT image reconstructions of the cervical spine were also generated. COMPARISON:  08/28/2016 and prior CTs FINDINGS: CT HEAD FINDINGS Brain: No evidence of acute infarction, hemorrhage, hydrocephalus, extra-axial collection or mass lesion/mass effect. Atrophy  and chronic small-vessel white matter ischemic changes again noted. Vascular: Intracranial atherosclerotic calcifications noted. Skull: Normal. Negative for fracture or focal lesion. Sinuses/Orbits: No acute finding. Other: None. CT CERVICAL SPINE FINDINGS Alignment: No acute subluxation. Straightening of the normal cervical lordosis noted. 2 mm anterolisthesis of C6 on C7 is unchanged. Skull base and vertebrae: No acute fracture. No focal bony lesions identified. Soft tissues and spinal canal: No prevertebral fluid or swelling. No visible canal hematoma. Disc levels: Mild to moderate multilevel degenerative disc disease, spondylosis and  facet arthropathy again noted. Upper chest: Negative. Other: None IMPRESSION: No evidence of acute intracranial abnormality. Atrophy and chronic small-vessel white matter ischemic changes. No static evidence of acute injury to the cervical spine. Electronically Signed   By: Margarette Canada M.D.   On: 10/01/2016 14:01    Procedures .Marland KitchenLaceration Repair Date/Time: 10/01/2016 3:31 PM Performed by: Sharlett Iles Authorized by: Sharlett Iles   Consent:    Consent obtained:  Emergent situation Anesthesia (see MAR for exact dosages):    Anesthesia method:  Local infiltration   Local anesthetic:  Lidocaine 1% WITH epi Laceration details:    Location:  Scalp   Scalp location: POST-AURICULAR.   Length (cm):  2 Repair type:    Repair type:  Simple Pre-procedure details:    Preparation:  Patient was prepped and draped in usual sterile fashion Exploration:    Contaminated: no   Treatment:    Area cleansed with:  Betadine   Amount of cleaning:  Standard   Irrigation solution:  Sterile saline   Irrigation volume:  100 ml   Irrigation method:  Pressure wash Skin repair:    Repair method:  Sutures   Suture size:  4-0   Suture material:  Nylon   Number of sutures:  3 Approximation:    Approximation:  Close Post-procedure details:    Dressing:  Antibiotic ointment and adhesive bandage   Patient tolerance of procedure:  Tolerated well, no immediate complications   (including critical care time)  Medications Ordered in ED Medications  lidocaine-EPINEPHrine (XYLOCAINE W/EPI) 2 %-1:200000 (PF) injection 10 mL (10 mLs Other Given by Other 10/01/16 1504)     Initial Impression / Assessment and Plan / ED Course  I have reviewed the triage vital signs and the nursing notes.  Pertinent imaging results that were available during my care of the patient were reviewed by me and considered in my medical decision making (see chart for details).     PT w/ above injuries from unwitnessed  fall. Resting comfortably with normal vital signs on my exam. Tetanus is up-to-date according to chart. Obtained CT of head and C-spine to rule out acute injury. Repair laceration at bedside and placed bacitracin on wound on his ear. See procedure note for details.  Patient otherwise comfortable with no other complaints and no other areas of new injury aside from known skin tear on his right hand. Patient discharged in satisfactory condition.  Final Clinical Impressions(s) / ED Diagnoses   Final diagnoses:  Fall, initial encounter  Laceration of other part of head without foreign body, initial encounter    New Prescriptions New Prescriptions   No medications on file     Little, Wenda Overland, MD 10/01/16 1535

## 2016-10-01 NOTE — ED Notes (Signed)
Bed: WA20 Expected date:  Expected time:  Means of arrival:  Comments: 83 yo fall - ear injury

## 2016-10-01 NOTE — ED Triage Notes (Addendum)
Per EMS. Pt from [facility] facility. Pt had an unwitnessed fall today. EMS noted avulsion on back of R ear. Pt has bandaged skin tears from fall yesterday that were treated here. Hx of dementia and hard of hearing, oriented per norm.

## 2016-10-01 NOTE — Discharge Instructions (Signed)
PLEASE RETURN TO PRIMARY CARE PROVIDER IN 5-7 DAYS FOR SUTURE REMOVAL. KEEP SUTURES CLEAN AND DRY. APPLY NEOSPORIN TO EAR. RETURN TO ER IF ANY REDNESS, DRAINAGE, SWELLING, OR WORSENING PAIN NEAR THE WOUNDS, OR FOR VOMITING, LETHARGY, OR ABNORMAL BEHAVIOR.

## 2016-10-03 DIAGNOSIS — R296 Repeated falls: Secondary | ICD-10-CM | POA: Diagnosis not present

## 2016-10-03 DIAGNOSIS — I10 Essential (primary) hypertension: Secondary | ICD-10-CM | POA: Diagnosis not present

## 2016-10-03 DIAGNOSIS — S50811A Abrasion of right forearm, initial encounter: Secondary | ICD-10-CM | POA: Diagnosis not present

## 2016-10-04 DIAGNOSIS — I4891 Unspecified atrial fibrillation: Secondary | ICD-10-CM | POA: Diagnosis not present

## 2016-10-04 DIAGNOSIS — E039 Hypothyroidism, unspecified: Secondary | ICD-10-CM | POA: Diagnosis not present

## 2016-10-04 DIAGNOSIS — R296 Repeated falls: Secondary | ICD-10-CM | POA: Diagnosis not present

## 2016-10-04 DIAGNOSIS — R2689 Other abnormalities of gait and mobility: Secondary | ICD-10-CM | POA: Diagnosis not present

## 2016-10-04 DIAGNOSIS — Z9181 History of falling: Secondary | ICD-10-CM | POA: Diagnosis not present

## 2016-10-04 DIAGNOSIS — R531 Weakness: Secondary | ICD-10-CM | POA: Diagnosis not present

## 2016-10-04 DIAGNOSIS — S61012D Laceration without foreign body of left thumb without damage to nail, subsequent encounter: Secondary | ICD-10-CM | POA: Diagnosis not present

## 2016-10-04 DIAGNOSIS — E119 Type 2 diabetes mellitus without complications: Secondary | ICD-10-CM | POA: Diagnosis not present

## 2016-10-04 DIAGNOSIS — N400 Enlarged prostate without lower urinary tract symptoms: Secondary | ICD-10-CM | POA: Diagnosis not present

## 2016-10-04 DIAGNOSIS — Z7984 Long term (current) use of oral hypoglycemic drugs: Secondary | ICD-10-CM | POA: Diagnosis not present

## 2016-10-05 ENCOUNTER — Encounter (HOSPITAL_COMMUNITY): Payer: Self-pay | Admitting: Emergency Medicine

## 2016-10-05 ENCOUNTER — Emergency Department (HOSPITAL_COMMUNITY): Payer: Medicare Other

## 2016-10-05 ENCOUNTER — Emergency Department (HOSPITAL_COMMUNITY)
Admission: EM | Admit: 2016-10-05 | Discharge: 2016-10-05 | Disposition: A | Discharge status: Skilled Nursing Facility | Payer: Medicare Other | Attending: Emergency Medicine | Admitting: Emergency Medicine

## 2016-10-05 DIAGNOSIS — W010XXA Fall on same level from slipping, tripping and stumbling without subsequent striking against object, initial encounter: Secondary | ICD-10-CM | POA: Diagnosis not present

## 2016-10-05 DIAGNOSIS — Y92002 Bathroom of unspecified non-institutional (private) residence single-family (private) house as the place of occurrence of the external cause: Secondary | ICD-10-CM | POA: Diagnosis not present

## 2016-10-05 DIAGNOSIS — Z79899 Other long term (current) drug therapy: Secondary | ICD-10-CM | POA: Insufficient documentation

## 2016-10-05 DIAGNOSIS — G8911 Acute pain due to trauma: Secondary | ICD-10-CM | POA: Diagnosis not present

## 2016-10-05 DIAGNOSIS — M79643 Pain in unspecified hand: Secondary | ICD-10-CM | POA: Diagnosis not present

## 2016-10-05 DIAGNOSIS — Y939 Activity, unspecified: Secondary | ICD-10-CM | POA: Insufficient documentation

## 2016-10-05 DIAGNOSIS — Y999 Unspecified external cause status: Secondary | ICD-10-CM | POA: Diagnosis not present

## 2016-10-05 DIAGNOSIS — E119 Type 2 diabetes mellitus without complications: Secondary | ICD-10-CM | POA: Diagnosis not present

## 2016-10-05 DIAGNOSIS — W19XXXA Unspecified fall, initial encounter: Secondary | ICD-10-CM

## 2016-10-05 DIAGNOSIS — T148XXA Other injury of unspecified body region, initial encounter: Secondary | ICD-10-CM | POA: Diagnosis not present

## 2016-10-05 DIAGNOSIS — S40011A Contusion of right shoulder, initial encounter: Secondary | ICD-10-CM | POA: Diagnosis not present

## 2016-10-05 DIAGNOSIS — S4991XA Unspecified injury of right shoulder and upper arm, initial encounter: Secondary | ICD-10-CM | POA: Diagnosis not present

## 2016-10-05 DIAGNOSIS — I10 Essential (primary) hypertension: Secondary | ICD-10-CM | POA: Insufficient documentation

## 2016-10-05 DIAGNOSIS — E039 Hypothyroidism, unspecified: Secondary | ICD-10-CM | POA: Diagnosis not present

## 2016-10-05 NOTE — ED Notes (Signed)
Bed: [facility]ALD Expected date:  Expected time:  Means of arrival:  Comments: No bed  

## 2016-10-05 NOTE — ED Notes (Signed)
X-ray at bedside

## 2016-10-05 NOTE — ED Notes (Signed)
Pt ambulated with little assistance.  

## 2016-10-05 NOTE — ED Triage Notes (Signed)
Per [facility] patient comes from [facility] for fall due to tripping while in bathroom.  Patient has abrasion on right shoulder.  Patient denies pain at this time. Patient has kerlix wrapped on right forearm from previous fall.

## 2016-10-05 NOTE — ED Provider Notes (Signed)
[facility] DEPT Provider Note   CSN: [id number] Arrival date & time: 10/05/16  1337     History   Chief Complaint Chief Complaint  Patient presents with  . Fall    HPI Joe [name] is a 83 y.o. male.  Pt presents to the ED last night s/p fall.  The pt tripped while in the bathroom.  He has dementia and is very HOH, so it is difficult to get a hx.  Pt has been here multiple times due to falls.  Only new injury today is an abrasion to his right shoulder.  Pt denies pain.    Pt has a.fib, but is not on anticoagulation due to fall risk.  CHA2DS2/VAS Stroke Risk Points      4 >= 2 Points: High Risk  1 - 1.99 Points: Medium Risk  0 Points: Low Risk    The previous score was 5 on 12/21/2015.:  Change:         Details    Note: External data might be a factor in metrics not marked with    Points Metrics   This score determines the patient's risk of having a stroke if the  patient has atrial fibrillation.       0 Has Congestive Heart Failure:  No   0 Has Vascular Disease:  No   1 Has Hypertension:  Yes   2 Age:  51   1 Has Diabetes:  Yes   0 Had Stroke:  No Had TIA:  No Had thromboembolism:  No   0 Male:  No             Past Medical History:  Diagnosis Date  . Anxiety   . Atrial fibrillation with RVR ([place]) 08/28/2014   [name] 08/28/2014  . BPH (benign prostatic hypertrophy)    [name] 08/28/2014  . Dementia   . Hypertension    [name] 08/28/2014  . Hypothyroidism    [name] 08/28/2014  . Thyroid disease     Patient Active Problem List   Diagnosis Date Noted  . Adjustment disorder with mixed anxiety and depressed mood 11/13/2014  . Cognitive deficits 11/13/2014  . Syncope and collapse 11/10/2014  . Orthostatic hypotension 11/10/2014  . Hypothyroidism 08/28/2014  . Essential hypertension 08/28/2014  . Atrial fibrillation, new onset ([place]) 08/28/2014  . Dizziness 08/28/2014  . BPH (benign prostatic hypertrophy) 08/28/2014  . Diabetes mellitus with no  complication ([place]) 58/52/7782  . Risky sexual behavior 08/28/2014  . A-fib ([place]) 08/28/2014  . Hypothyroid 09/10/2013  . BPH (benign prostatic hyperplasia) 09/10/2013    Past Surgical History:  Procedure Laterality Date  . PARS PLANA VITRECTOMY Left 03/30/2013   Procedure: PARS PLANA VITRECTOMY 25 GAUGE FOR ENDOPHTHALMITIS;  Surgeon: Hurman Horn, MD;  Location: [facility];  Service: Ophthalmology;  Laterality: Left;  LOCAL RETROBULBAR,,  USE MARCAINE 0.5%       Home Medications    Prior to Admission medications   Medication Sig Start Date End Date Taking? Authorizing Provider  ketoconazole (NIZORAL) 2 % shampoo Apply 1 application topically 3 (three) times a week.   Yes [provider]  levothyroxine (SYNTHROID, LEVOTHROID) 100 MCG tablet Take 100 mcg by mouth daily before breakfast.   Yes [provider]  LORazepam (ATIVAN) 0.5 MG tablet Take 5 mg by mouth every 6 (six) hours as needed for anxiety. 08/24/16  Yes [provider]  metoprolol tartrate (LOPRESSOR) 25 MG tablet Take 12.5 mg by mouth 2 (two) times daily.  Yes [provider]  neomycin-bacitracin-polymyxin (NEOSPORIN) [phone] ointment Apply 1 application topically 2 (two) times daily. To right ear   Yes [provider]  QUEtiapine (SEROQUEL) 50 MG tablet Take 50 mg by mouth 2 (two) times daily.    Yes [provider]  tamsulosin (FLOMAX) 0.4 MG CAPS capsule Take 0.4 mg by mouth daily.   Yes [provider]  TRADJENTA 5 MG TABS tablet Take 5 mg by mouth daily. 08/26/16  Yes [provider]    Family History Family History  Problem Relation Age of Onset  . Hyperlipidemia Sister     Social History Social History  Substance Use Topics  . Smoking status: Never Smoker  . Smokeless tobacco: Never Used  . Alcohol use No     Allergies   No known allergies   Review of Systems Review of Systems  Unable to perform ROS: Dementia  All other systems  reviewed and are negative.    Physical Exam Updated Vital Signs BP 108/68 (BP Location: Left Arm)   Pulse 89   Resp 17   SpO2 97%   Physical Exam  Constitutional: He appears well-developed and well-nourished.  HENT:  Head: Normocephalic.    Left Ear: External ear normal.  Nose: Nose normal.  Mouth/Throat: Oropharynx is clear and moist.  Eyes: Conjunctivae and EOM are normal. Pupils are equal, round, and reactive to light.  Neck: Normal range of motion. Neck supple.  Cardiovascular: Normal rate, regular rhythm, normal heart sounds and intact distal pulses.   Pulmonary/Chest: Effort normal and breath sounds normal.  Abdominal: Soft. Bowel sounds are normal.  Musculoskeletal:       Arms: Multiple bruises on arms and legs in various stages of healing.  Neurological: He is alert.  Mental status is at norm per EMS  Skin: Skin is warm.     Psychiatric: He has a normal mood and affect.  Nursing note and vitals reviewed.    ED Treatments / Results  Labs (all labs ordered are listed, but only abnormal results are displayed) Labs Reviewed - No data to display  EKG  EKG Interpretation None       Radiology Dg Shoulder Right  Result Date: 10/05/2016 CLINICAL DATA:  As post tripping and falling in the bathroom. Right shoulder abrasion. The patient denies pain currently. EXAM: RIGHT SHOULDER - 2+ VIEW COMPARISON:  None in PACs FINDINGS: The bones are subjectively osteopenic. No acute fracture is observed. There is mild superior migration of the humeral head with respect to the glenoid. This may reflect underlying rotator cuff pathology. The glenohumeral and AC joint spaces are reasonably well-maintained. The observed portions of the right clavicle and upper right ribs are normal. There are old lateral right rib fractures without complete healing. IMPRESSION: No acute right shoulder fracture or dislocation is observed. Electronically Signed   By: David  Martinique M.D.   On: 10/05/2016  16:57    Procedures Procedures (including critical care time)  Medications Ordered in ED Medications - No data to display   Initial Impression / Assessment and Plan / ED Course  I have reviewed the triage vital signs and the nursing notes.  Pertinent labs & imaging results that were available during my care of the patient were reviewed by me and considered in my medical decision making (see chart for details).    No fracture.  Pt is stable for d/c.  Final Clinical Impressions(s) / ED Diagnoses   Final diagnoses:  Fall, initial encounter  Contusion  of right shoulder, initial encounter    New Prescriptions New Prescriptions   No medications on file     Isla Pence, MD 10/05/16 [phone]

## 2016-10-05 NOTE — ED Notes (Signed)
PTAR notified of transport back to [facility].

## 2016-10-05 NOTE — ED Notes (Signed)
ED Provider at bedside. 

## 2016-10-05 NOTE — ED Notes (Signed)
Bed: MC94 Expected date:  Expected time:  Means of arrival:  Comments: EMS/fall 83y.o.

## 2016-10-07 DIAGNOSIS — R296 Repeated falls: Secondary | ICD-10-CM | POA: Diagnosis not present

## 2016-10-07 DIAGNOSIS — I4891 Unspecified atrial fibrillation: Secondary | ICD-10-CM | POA: Diagnosis not present

## 2016-10-07 DIAGNOSIS — E039 Hypothyroidism, unspecified: Secondary | ICD-10-CM | POA: Diagnosis not present

## 2016-10-07 DIAGNOSIS — R2689 Other abnormalities of gait and mobility: Secondary | ICD-10-CM | POA: Diagnosis not present

## 2016-10-07 DIAGNOSIS — S61012D Laceration without foreign body of left thumb without damage to nail, subsequent encounter: Secondary | ICD-10-CM | POA: Diagnosis not present

## 2016-10-07 DIAGNOSIS — R531 Weakness: Secondary | ICD-10-CM | POA: Diagnosis not present

## 2016-10-07 DIAGNOSIS — E119 Type 2 diabetes mellitus without complications: Secondary | ICD-10-CM | POA: Diagnosis not present

## 2016-10-07 DIAGNOSIS — Z7984 Long term (current) use of oral hypoglycemic drugs: Secondary | ICD-10-CM | POA: Diagnosis not present

## 2016-10-07 DIAGNOSIS — Z9181 History of falling: Secondary | ICD-10-CM | POA: Diagnosis not present

## 2016-10-07 DIAGNOSIS — R69 Illness, unspecified: Secondary | ICD-10-CM | POA: Diagnosis not present

## 2016-10-07 DIAGNOSIS — N400 Enlarged prostate without lower urinary tract symptoms: Secondary | ICD-10-CM | POA: Diagnosis not present

## 2016-10-09 DIAGNOSIS — E119 Type 2 diabetes mellitus without complications: Secondary | ICD-10-CM | POA: Diagnosis not present

## 2016-10-09 DIAGNOSIS — N400 Enlarged prostate without lower urinary tract symptoms: Secondary | ICD-10-CM | POA: Diagnosis not present

## 2016-10-09 DIAGNOSIS — Z7984 Long term (current) use of oral hypoglycemic drugs: Secondary | ICD-10-CM | POA: Diagnosis not present

## 2016-10-09 DIAGNOSIS — R296 Repeated falls: Secondary | ICD-10-CM | POA: Diagnosis not present

## 2016-10-09 DIAGNOSIS — Z9181 History of falling: Secondary | ICD-10-CM | POA: Diagnosis not present

## 2016-10-09 DIAGNOSIS — S61012D Laceration without foreign body of left thumb without damage to nail, subsequent encounter: Secondary | ICD-10-CM | POA: Diagnosis not present

## 2016-10-09 DIAGNOSIS — R531 Weakness: Secondary | ICD-10-CM | POA: Diagnosis not present

## 2016-10-09 DIAGNOSIS — R2689 Other abnormalities of gait and mobility: Secondary | ICD-10-CM | POA: Diagnosis not present

## 2016-10-09 DIAGNOSIS — E039 Hypothyroidism, unspecified: Secondary | ICD-10-CM | POA: Diagnosis not present

## 2016-10-09 DIAGNOSIS — I4891 Unspecified atrial fibrillation: Secondary | ICD-10-CM | POA: Diagnosis not present

## 2016-10-10 DIAGNOSIS — Z9181 History of falling: Secondary | ICD-10-CM | POA: Diagnosis not present

## 2016-10-10 DIAGNOSIS — I4891 Unspecified atrial fibrillation: Secondary | ICD-10-CM | POA: Diagnosis not present

## 2016-10-10 DIAGNOSIS — E039 Hypothyroidism, unspecified: Secondary | ICD-10-CM | POA: Diagnosis not present

## 2016-10-10 DIAGNOSIS — S61012D Laceration without foreign body of left thumb without damage to nail, subsequent encounter: Secondary | ICD-10-CM | POA: Diagnosis not present

## 2016-10-10 DIAGNOSIS — S40811A Abrasion of right upper arm, initial encounter: Secondary | ICD-10-CM | POA: Diagnosis not present

## 2016-10-10 DIAGNOSIS — R531 Weakness: Secondary | ICD-10-CM | POA: Diagnosis not present

## 2016-10-10 DIAGNOSIS — I10 Essential (primary) hypertension: Secondary | ICD-10-CM | POA: Diagnosis not present

## 2016-10-10 DIAGNOSIS — Z7984 Long term (current) use of oral hypoglycemic drugs: Secondary | ICD-10-CM | POA: Diagnosis not present

## 2016-10-10 DIAGNOSIS — R2689 Other abnormalities of gait and mobility: Secondary | ICD-10-CM | POA: Diagnosis not present

## 2016-10-10 DIAGNOSIS — N400 Enlarged prostate without lower urinary tract symptoms: Secondary | ICD-10-CM | POA: Diagnosis not present

## 2016-10-10 DIAGNOSIS — R296 Repeated falls: Secondary | ICD-10-CM | POA: Diagnosis not present

## 2016-10-10 DIAGNOSIS — E119 Type 2 diabetes mellitus without complications: Secondary | ICD-10-CM | POA: Diagnosis not present

## 2016-10-14 DIAGNOSIS — E119 Type 2 diabetes mellitus without complications: Secondary | ICD-10-CM | POA: Diagnosis not present

## 2016-10-14 DIAGNOSIS — R319 Hematuria, unspecified: Secondary | ICD-10-CM | POA: Diagnosis not present

## 2016-10-17 DIAGNOSIS — N390 Urinary tract infection, site not specified: Secondary | ICD-10-CM | POA: Diagnosis not present

## 2016-10-17 DIAGNOSIS — L03113 Cellulitis of right upper limb: Secondary | ICD-10-CM | POA: Diagnosis not present

## 2016-10-17 DIAGNOSIS — E559 Vitamin D deficiency, unspecified: Secondary | ICD-10-CM | POA: Diagnosis not present

## 2016-10-17 DIAGNOSIS — E039 Hypothyroidism, unspecified: Secondary | ICD-10-CM | POA: Diagnosis not present

## 2016-11-03 DIAGNOSIS — R296 Repeated falls: Secondary | ICD-10-CM | POA: Diagnosis not present

## 2016-11-03 DIAGNOSIS — R69 Illness, unspecified: Secondary | ICD-10-CM | POA: Diagnosis not present

## 2016-11-07 ENCOUNTER — Emergency Department (HOSPITAL_COMMUNITY)
Admission: EM | Admit: 2016-11-07 | Discharge: 2016-11-08 | Disposition: A | Discharge status: Home / Self Care | Payer: Medicare Other | Attending: Emergency Medicine | Admitting: Emergency Medicine

## 2016-11-07 ENCOUNTER — Encounter (HOSPITAL_COMMUNITY): Payer: Self-pay

## 2016-11-07 DIAGNOSIS — Y939 Activity, unspecified: Secondary | ICD-10-CM | POA: Diagnosis not present

## 2016-11-07 DIAGNOSIS — Y92129 Unspecified place in nursing home as the place of occurrence of the external cause: Secondary | ICD-10-CM | POA: Insufficient documentation

## 2016-11-07 DIAGNOSIS — T148XXA Other injury of unspecified body region, initial encounter: Secondary | ICD-10-CM | POA: Diagnosis not present

## 2016-11-07 DIAGNOSIS — S51011A Laceration without foreign body of right elbow, initial encounter: Secondary | ICD-10-CM

## 2016-11-07 DIAGNOSIS — M79601 Pain in right arm: Secondary | ICD-10-CM | POA: Diagnosis not present

## 2016-11-07 DIAGNOSIS — S61412A Laceration without foreign body of left hand, initial encounter: Secondary | ICD-10-CM

## 2016-11-07 DIAGNOSIS — Y999 Unspecified external cause status: Secondary | ICD-10-CM | POA: Diagnosis not present

## 2016-11-07 DIAGNOSIS — Z79899 Other long term (current) drug therapy: Secondary | ICD-10-CM | POA: Insufficient documentation

## 2016-11-07 DIAGNOSIS — E039 Hypothyroidism, unspecified: Secondary | ICD-10-CM | POA: Insufficient documentation

## 2016-11-07 DIAGNOSIS — W19XXXA Unspecified fall, initial encounter: Secondary | ICD-10-CM | POA: Diagnosis not present

## 2016-11-07 DIAGNOSIS — S61411A Laceration without foreign body of right hand, initial encounter: Secondary | ICD-10-CM | POA: Diagnosis not present

## 2016-11-07 DIAGNOSIS — I10 Essential (primary) hypertension: Secondary | ICD-10-CM | POA: Diagnosis not present

## 2016-11-07 DIAGNOSIS — Z515 Encounter for palliative care: Secondary | ICD-10-CM

## 2016-11-07 DIAGNOSIS — S56901A Unspecified injury of unspecified muscles, fascia and tendons at forearm level, right arm, initial encounter: Secondary | ICD-10-CM | POA: Diagnosis present

## 2016-11-07 NOTE — ED Notes (Signed)
Patient states he did not want to come to the ED for treatment. Patient was evaluated by the hospice RN and stated not to bring patient to the ED.

## 2016-11-07 NOTE — ED Triage Notes (Signed)
Patient evaluated by [facility] RN and told facility he did not need to come to ED, CNA at [facility] sent patient.

## 2016-11-07 NOTE — ED Notes (Signed)
Bed: WHALE Expected date:  Expected time:  Means of arrival:  Comments: EMS 83 yo male with fall-hematoma to head/hospice patient

## 2016-11-07 NOTE — ED Notes (Signed)
PTAR notified of need for transport back to [facility]

## 2016-11-07 NOTE — ED Provider Notes (Signed)
[facility] DEPT Provider Note: Georgena Spurling, MD, FACEP  CSN: [id number] MRN: [id number] ARRIVAL: 11/07/16 at [place]: [place]  Joe [name] is a 83 y.o. male hospice patient. He fell earlier today and has a skin tear of his right elbow and thenar aspect of the left thenar eminence and thumb. He was evaluated by his hospice nurse who determined the patient did not be need to be transported to the ED but his living facility transported him anyway. He states emphatically that he does not want or need to be here. His skin tears were dressed by the charge nurse prior to my evaluation. He is denying significant pain. He has multiple other abrasions from previous falls.   Past Medical History:  Diagnosis Date  . Anxiety   . Atrial fibrillation with RVR ([place]) 08/28/2014   [name] 08/28/2014  . BPH (benign prostatic hypertrophy)    [name] 08/28/2014  . Dementia   . Hypertension    [name] 08/28/2014  . Hypothyroidism    [name] 08/28/2014  . Thyroid disease     Past Surgical History:  Procedure Laterality Date  . PARS PLANA VITRECTOMY Left 03/30/2013   Procedure: PARS PLANA VITRECTOMY 25 GAUGE FOR ENDOPHTHALMITIS;  Surgeon: Hurman Horn, MD;  Location: [place];  Service: Ophthalmology;  Laterality: Left;  LOCAL RETROBULBAR,,  USE MARCAINE 0.5%    Family History  Problem Relation Age of Onset  . Hyperlipidemia Sister     Social History  Substance Use Topics  . Smoking status: Never Smoker  . Smokeless tobacco: Never Used  . Alcohol use No    Prior to Admission medications   Medication Sig Start Date End Date Taking? Authorizing Provider  ketoconazole (NIZORAL) 2 % shampoo Apply 1 application topically 3 (three) times a week.    [provider]  levothyroxine (SYNTHROID, LEVOTHROID) 100 MCG tablet Take 100 mcg by mouth daily before breakfast.    [provider]  LORazepam (ATIVAN) 0.5 MG tablet Take  5 mg by mouth every 6 (six) hours as needed for anxiety. 08/24/16   [provider]  metoprolol tartrate (LOPRESSOR) 25 MG tablet Take 12.5 mg by mouth 2 (two) times daily.    [provider]  neomycin-bacitracin-polymyxin (NEOSPORIN) [phone] ointment Apply 1 application topically 2 (two) times daily. To right ear    [provider]  QUEtiapine (SEROQUEL) 50 MG tablet Take 50 mg by mouth 2 (two) times daily.     [provider]  tamsulosin (FLOMAX) 0.4 MG CAPS capsule Take 0.4 mg by mouth daily.    [provider]  TRADJENTA 5 MG TABS tablet Take 5 mg by mouth daily. 08/26/16   [provider]    Allergies No known allergies   REVIEW OF SYSTEMS  Negative except as noted here or in the History of Present Illness.   PHYSICAL EXAMINATION  Initial Vital Signs Blood pressure 117/77, pulse 97, temperature 98.9 F (37.2 C), temperature source Oral, resp. rate 16, SpO2 100 %.  Examination General: Well-developed, cachectic male in no acute distress; appearance consistent with age of record HENT: normocephalic; healing abrasions of face Eyes: pupils equal, round and reactive to light Neck: supple Heart: regular rate and rhythm Lungs: clear to auscultation bilaterally Abdomen: soft; nondistended; nontender; bowel sounds present Extremities: No acute bony deformity; no pain on passive range of motion; pulses normal Neurologic: Awake, alert; motor function intact in all extremities and  symmetric; no facial droop Skin: Warm and dry; acute appearing skin tears of right elbow and left hand and thenar eminence Psychiatric: Flat affect   RESULTS  Summary of this visit's results, reviewed by myself:   EKG Interpretation  Date/Time:    Ventricular Rate:    PR Interval:    QRS Duration:   QT Interval:    QTC Calculation:   R Axis:     Text Interpretation:        Laboratory Studies: No results found for this or any previous visit  (from the past 24 hour(s)). Imaging Studies: No results found.  ED COURSE  Nursing notes and initial vitals signs, including pulse oximetry, reviewed.  Vitals:   11/07/16 2337  BP: 117/77  Pulse: 97  Resp: 16  Temp: 98.9 F (37.2 C)  TempSrc: Oral  SpO2: 100%    PROCEDURES    ED DIAGNOSES     ICD-10-CM   1. Fall, initial encounter W19.XXXA   2. Skin tear of right elbow without complication, initial encounter S51.011A   3. Skin tear of left hand without complication, initial encounter S61.412A   4. Hospice care patient Z51.5        Shanon Rosser, MD 11/07/16 [phone]

## 2016-11-07 NOTE — ED Triage Notes (Signed)
Patient arrives by EMS from [facility] with complaint of fall with skin tears to right elbow and left hand. Patient is under hospice care.

## 2016-11-07 NOTE — ED Notes (Signed)
Skin tear to left thumb and palmer aspect of left hand cleaned and tegaderm applied. Abrasion to right elbow cleaned with non-toxic wound cleaner and telfa pad and kling dressing applied. Patient states he does not want an xray of his right elbow. Patient has full ROM of right elbow-drinking a coke with right hand. Dr. Florina Ou evaluated patient prior to dressings being applied.

## 2016-11-08 DIAGNOSIS — S0993XA Unspecified injury of face, initial encounter: Secondary | ICD-10-CM | POA: Diagnosis not present

## 2016-11-08 DIAGNOSIS — I621 Nontraumatic extradural hemorrhage: Secondary | ICD-10-CM | POA: Diagnosis not present

## 2016-11-11 ENCOUNTER — Encounter (HOSPITAL_COMMUNITY): Payer: Self-pay | Admitting: Emergency Medicine

## 2016-11-11 ENCOUNTER — Emergency Department (HOSPITAL_COMMUNITY): Payer: Medicare Other

## 2016-11-11 ENCOUNTER — Emergency Department (HOSPITAL_COMMUNITY)
Admission: EM | Admit: 2016-11-11 | Discharge: 2016-11-12 | Disposition: A | Discharge status: Home / Self Care | Payer: Medicare Other | Attending: Emergency Medicine | Admitting: Emergency Medicine

## 2016-11-11 DIAGNOSIS — Y92129 Unspecified place in nursing home as the place of occurrence of the external cause: Secondary | ICD-10-CM | POA: Diagnosis not present

## 2016-11-11 DIAGNOSIS — S0990XA Unspecified injury of head, initial encounter: Secondary | ICD-10-CM

## 2016-11-11 DIAGNOSIS — I10 Essential (primary) hypertension: Secondary | ICD-10-CM | POA: Diagnosis not present

## 2016-11-11 DIAGNOSIS — E119 Type 2 diabetes mellitus without complications: Secondary | ICD-10-CM | POA: Diagnosis not present

## 2016-11-11 DIAGNOSIS — W19XXXA Unspecified fall, initial encounter: Secondary | ICD-10-CM | POA: Insufficient documentation

## 2016-11-11 DIAGNOSIS — Z79899 Other long term (current) drug therapy: Secondary | ICD-10-CM | POA: Diagnosis not present

## 2016-11-11 DIAGNOSIS — E039 Hypothyroidism, unspecified: Secondary | ICD-10-CM | POA: Diagnosis not present

## 2016-11-11 DIAGNOSIS — Y939 Activity, unspecified: Secondary | ICD-10-CM | POA: Diagnosis not present

## 2016-11-11 DIAGNOSIS — S0003XA Contusion of scalp, initial encounter: Secondary | ICD-10-CM | POA: Diagnosis not present

## 2016-11-11 DIAGNOSIS — S064X1A Epidural hemorrhage with loss of consciousness of 30 minutes or less, initial encounter: Secondary | ICD-10-CM | POA: Diagnosis not present

## 2016-11-11 DIAGNOSIS — S0083XA Contusion of other part of head, initial encounter: Secondary | ICD-10-CM | POA: Diagnosis not present

## 2016-11-11 DIAGNOSIS — S199XXA Unspecified injury of neck, initial encounter: Secondary | ICD-10-CM | POA: Diagnosis not present

## 2016-11-11 DIAGNOSIS — Y999 Unspecified external cause status: Secondary | ICD-10-CM | POA: Diagnosis not present

## 2016-11-11 NOTE — ED Triage Notes (Signed)
Pt brought in by EMS after an unwitnessed fall at the nursing home, [facility]   This is his 4th fall this week  Pt has a hematoma noted to the right side of his forehead and a small lac to his lower eyelid  Bleeding controlled  Pt has aggressive behavior and confusion per his norm

## 2016-11-11 NOTE — ED Provider Notes (Signed)
[facility] DEPT Provider Note   CSN: [id number] Arrival date & time: 11/11/16  2057     History   Chief Complaint Chief Complaint  Patient presents with  . Fall    HPI Joe [name] is a 83 y.o. male.  HPI  Patient presents with nursing facility after an unwitnessed fall. Patient has multiple medical issues including dementia, and presents from his nursing facility. Patient was also enrolled in hospice care. Patient denies pain, but also denies any recollection of falling. Level V caveat secondary to dementia.  Past Medical History:  Diagnosis Date  . Anxiety   . Atrial fibrillation with RVR ([place]) 08/28/2014   [name] 08/28/2014  . BPH (benign prostatic hypertrophy)    [name] 08/28/2014  . Dementia   . Hypertension    [name] 08/28/2014  . Hypothyroidism    [name] 08/28/2014  . Thyroid disease     Patient Active Problem List   Diagnosis Date Noted  . Adjustment disorder with mixed anxiety and depressed mood 11/13/2014  . Cognitive deficits 11/13/2014  . Syncope and collapse 11/10/2014  . Orthostatic hypotension 11/10/2014  . Hypothyroidism 08/28/2014  . Essential hypertension 08/28/2014  . Atrial fibrillation, new onset ([place]) 08/28/2014  . Dizziness 08/28/2014  . BPH (benign prostatic hypertrophy) 08/28/2014  . Diabetes mellitus with no complication ([place]) 56/25/6389  . Risky sexual behavior 08/28/2014  . A-fib ([place]) 08/28/2014  . Hypothyroid 09/10/2013  . BPH (benign prostatic hyperplasia) 09/10/2013    Past Surgical History:  Procedure Laterality Date  . PARS PLANA VITRECTOMY Left 03/30/2013   Procedure: PARS PLANA VITRECTOMY 25 GAUGE FOR ENDOPHTHALMITIS;  Surgeon: Hurman Horn, MD;  Location: [facility];  Service: Ophthalmology;  Laterality: Left;  LOCAL RETROBULBAR,,  USE MARCAINE 0.5%       Home Medications    Prior to Admission medications   Medication Sig Start Date End Date Taking? Authorizing Provider  ketoconazole (NIZORAL) 2 % shampoo  Apply 1 application topically 3 (three) times a week.   Yes [provider]  levothyroxine (SYNTHROID, LEVOTHROID) 112 MCG tablet Take 112 mcg by mouth daily before breakfast.   Yes [provider]  LORazepam (ATIVAN) 0.5 MG tablet Take 5 mg by mouth daily. May take 1 mg q6hprn for anxiety 08/24/16  Yes [provider]  metoprolol tartrate (LOPRESSOR) 25 MG tablet Take 12.5 mg by mouth daily.    Yes [provider]  neomycin-bacitracin-polymyxin (NEOSPORIN) [phone] ointment Apply 1 application topically 2 (two) times daily. To right ear   Yes [provider]  QUEtiapine (SEROQUEL) 50 MG tablet Take 50 mg by mouth 2 (two) times daily.    Yes [provider]  tamsulosin (FLOMAX) 0.4 MG CAPS capsule Take 0.4 mg by mouth daily.   Yes [provider]  TRADJENTA 5 MG TABS tablet Take 5 mg by mouth daily. 08/26/16  Yes [provider]  Vitamin D, Ergocalciferol, (DRISDOL) 50000 units CAPS capsule Take 50,000 Units by mouth every 7 (seven) days.   Yes [provider]    Family History Family History  Problem Relation Age of Onset  . Hyperlipidemia Sister     Social History Social History  Substance Use Topics  . Smoking status: Never Smoker  . Smokeless tobacco: Never Used  . Alcohol use No     Allergies   No known allergies   Review of Systems Review of Systems  Unable to perform ROS: Dementia     Physical Exam Updated Vital Signs BP 106/83 (  BP Location: Left Arm)   Pulse (!) 110   Temp (!) 97.5 F (36.4 C) (Oral)   Resp 20   SpO2 94%   Physical Exam  Constitutional: He has a sickly appearance. No distress.  Frail appearing elderly male resting comfortably, answered questions briefly, but appropriately.   HENT:  Head: Normocephalic and atraumatic.    Eyes: Conjunctivae and EOM are normal.  Neck:  Neck is wrapped in a towel for immobilization, no gross deformities  Cardiovascular: Normal  rate and regular rhythm.   Pulmonary/Chest: Effort normal. No stridor. No respiratory distress.  Abdominal: He exhibits no distension.  Musculoskeletal: He exhibits no edema.  Neurological: He is alert. He displays atrophy. He displays no tremor. He displays no seizure activity.  Skin: Skin is warm and dry.  Psychiatric: He is slowed and withdrawn. Cognition and memory are impaired.  Nursing note and vitals reviewed.    ED Treatments / Results   Radiology Ct Head Wo Contrast  Result Date: 11/11/2016 CLINICAL DATA:  Unwitnessed fall at care facility. Fourth fall this week. Head facial hematoma and eye laceration. Altered mental status. History of atrial fibrillation, dementia. EXAM: CT HEAD WITHOUT CONTRAST CT MAXILLOFACIAL WITHOUT CONTRAST CT CERVICAL SPINE WITHOUT CONTRAST TECHNIQUE: Multidetector CT imaging of the head, cervical spine, and maxillofacial structures were performed using the standard protocol without intravenous contrast. Multiplanar CT image reconstructions of the cervical spine and maxillofacial structures were also generated. COMPARISON:  CT HEAD and cervical spine October 01, 2016 FINDINGS: CT HEAD FINDINGS BRAIN: No intraparenchymal hemorrhage, mass effect nor midline shift. Small area LEFT frontal encephalomalacia. Moderate to severe ventriculomegaly on the basis of global parenchymal brain volume loss. Confluent supratentorial white matter hypodensities. No acute large vascular territory infarcts. No abnormal extra-axial fluid collections. Basal cisterns are patent. VASCULAR: Severe calcific atherosclerosis of the carotid siphons. SKULL: No skull fracture. Large RIGHT frontal scalp hematoma and subcutaneous gas without radiopaque foreign bodies. OTHER: None. CT MAXILLOFACIAL FINDINGS- moderate motion through the level of the orbits. OSSEOUS: The mandible is intact, the condyles are located. No acute facial fracture. No destructive bony lesions. Poor dentition with multiple dental  caries and periapical abscesses. ORBITS: Ocular globes and orbital contents are non-suspicious. Status post bilateral ocular lens implants. SINUSES: Mild paranasal sinus mucosal thickening. Nasal septum is midline. Included mastoid aircells are well aerated. Soft tissue effacing the LEFT external auditory canal compatible with cerumen. SOFT TISSUES: RIGHT pre malar soft tissue swelling with overlying bandage. CT CERVICAL SPINE FINDINGS ALIGNMENT: Maintained lordosis. Stable grade 1 C6-7 anterolisthesis. SKULL BASE AND VERTEBRAE: Old mild T7 compression fracture. Old T2 mild compression fracture. No acute fracture. Similar moderate to severe C3-4, C4-5 degenerative discs. Multilevel advanced facet arthropathy. Osteopenia without destructive bony lesions. C1-2 articulation maintained with moderate arthropathy. No destructive bony lesions. C1-2 articulation maintained. SOFT TISSUES AND SPINAL CANAL: Nonacute. LEFT parotid sialolith. Moderate calcific atherosclerosis the carotid bifurcations. Diminutive versus resected thyroid. DISC LEVELS: Mild canal stenosis C3-4. Severe C3-4 and C4-5 neural foraminal narrowing, moderate to severe RIGHT C5-6 neural foraminal narrowing. UPPER CHEST: Lung apices are clear. OTHER: None. IMPRESSION: CT HEAD: 1. No acute intracranial process. Large RIGHT frontal scalp hematoma and laceration. No skull fracture. 2. Stable moderate to severe atrophy and severe chronic small vessel ischemic disease. CT MAXILLOFACIAL: 1. RIGHT pre malar soft tissue swelling without acute facial fracture. 2. Poor dentition. CT CERVICAL SPINE: 1. No acute fracture. 2. Stable chronic changes including C6-7 anterolisthesis and mild old T7 and T2 compression fractures.  3. Severe C3-4 and C4-5 neural foraminal narrowing. Electronically Signed   By: Elon Alas M.D.   On: 11/11/2016 23:29   Ct Cervical Spine Wo Contrast  Result Date: 11/11/2016 CLINICAL DATA:  Unwitnessed fall at care facility. Fourth fall  this week. Head facial hematoma and eye laceration. Altered mental status. History of atrial fibrillation, dementia. EXAM: CT HEAD WITHOUT CONTRAST CT MAXILLOFACIAL WITHOUT CONTRAST CT CERVICAL SPINE WITHOUT CONTRAST TECHNIQUE: Multidetector CT imaging of the head, cervical spine, and maxillofacial structures were performed using the standard protocol without intravenous contrast. Multiplanar CT image reconstructions of the cervical spine and maxillofacial structures were also generated. COMPARISON:  CT HEAD and cervical spine October 01, 2016 FINDINGS: CT HEAD FINDINGS BRAIN: No intraparenchymal hemorrhage, mass effect nor midline shift. Small area LEFT frontal encephalomalacia. Moderate to severe ventriculomegaly on the basis of global parenchymal brain volume loss. Confluent supratentorial white matter hypodensities. No acute large vascular territory infarcts. No abnormal extra-axial fluid collections. Basal cisterns are patent. VASCULAR: Severe calcific atherosclerosis of the carotid siphons. SKULL: No skull fracture. Large RIGHT frontal scalp hematoma and subcutaneous gas without radiopaque foreign bodies. OTHER: None. CT MAXILLOFACIAL FINDINGS- moderate motion through the level of the orbits. OSSEOUS: The mandible is intact, the condyles are located. No acute facial fracture. No destructive bony lesions. Poor dentition with multiple dental caries and periapical abscesses. ORBITS: Ocular globes and orbital contents are non-suspicious. Status post bilateral ocular lens implants. SINUSES: Mild paranasal sinus mucosal thickening. Nasal septum is midline. Included mastoid aircells are well aerated. Soft tissue effacing the LEFT external auditory canal compatible with cerumen. SOFT TISSUES: RIGHT pre malar soft tissue swelling with overlying bandage. CT CERVICAL SPINE FINDINGS ALIGNMENT: Maintained lordosis. Stable grade 1 C6-7 anterolisthesis. SKULL BASE AND VERTEBRAE: Old mild T7 compression fracture. Old T2 mild  compression fracture. No acute fracture. Similar moderate to severe C3-4, C4-5 degenerative discs. Multilevel advanced facet arthropathy. Osteopenia without destructive bony lesions. C1-2 articulation maintained with moderate arthropathy. No destructive bony lesions. C1-2 articulation maintained. SOFT TISSUES AND SPINAL CANAL: Nonacute. LEFT parotid sialolith. Moderate calcific atherosclerosis the carotid bifurcations. Diminutive versus resected thyroid. DISC LEVELS: Mild canal stenosis C3-4. Severe C3-4 and C4-5 neural foraminal narrowing, moderate to severe RIGHT C5-6 neural foraminal narrowing. UPPER CHEST: Lung apices are clear. OTHER: None. IMPRESSION: CT HEAD: 1. No acute intracranial process. Large RIGHT frontal scalp hematoma and laceration. No skull fracture. 2. Stable moderate to severe atrophy and severe chronic small vessel ischemic disease. CT MAXILLOFACIAL: 1. RIGHT pre malar soft tissue swelling without acute facial fracture. 2. Poor dentition. CT CERVICAL SPINE: 1. No acute fracture. 2. Stable chronic changes including C6-7 anterolisthesis and mild old T7 and T2 compression fractures. 3. Severe C3-4 and C4-5 neural foraminal narrowing. Electronically Signed   By: Elon Alas M.D.   On: 11/11/2016 23:29   Ct Maxillofacial Wo Cm  Result Date: 11/11/2016 CLINICAL DATA:  Unwitnessed fall at care facility. Fourth fall this week. Head facial hematoma and eye laceration. Altered mental status. History of atrial fibrillation, dementia. EXAM: CT HEAD WITHOUT CONTRAST CT MAXILLOFACIAL WITHOUT CONTRAST CT CERVICAL SPINE WITHOUT CONTRAST TECHNIQUE: Multidetector CT imaging of the head, cervical spine, and maxillofacial structures were performed using the standard protocol without intravenous contrast. Multiplanar CT image reconstructions of the cervical spine and maxillofacial structures were also generated. COMPARISON:  CT HEAD and cervical spine October 01, 2016 FINDINGS: CT HEAD FINDINGS BRAIN: No  intraparenchymal hemorrhage, mass effect nor midline shift. Small area LEFT frontal encephalomalacia. Moderate to  severe ventriculomegaly on the basis of global parenchymal brain volume loss. Confluent supratentorial white matter hypodensities. No acute large vascular territory infarcts. No abnormal extra-axial fluid collections. Basal cisterns are patent. VASCULAR: Severe calcific atherosclerosis of the carotid siphons. SKULL: No skull fracture. Large RIGHT frontal scalp hematoma and subcutaneous gas without radiopaque foreign bodies. OTHER: None. CT MAXILLOFACIAL FINDINGS- moderate motion through the level of the orbits. OSSEOUS: The mandible is intact, the condyles are located. No acute facial fracture. No destructive bony lesions. Poor dentition with multiple dental caries and periapical abscesses. ORBITS: Ocular globes and orbital contents are non-suspicious. Status post bilateral ocular lens implants. SINUSES: Mild paranasal sinus mucosal thickening. Nasal septum is midline. Included mastoid aircells are well aerated. Soft tissue effacing the LEFT external auditory canal compatible with cerumen. SOFT TISSUES: RIGHT pre malar soft tissue swelling with overlying bandage. CT CERVICAL SPINE FINDINGS ALIGNMENT: Maintained lordosis. Stable grade 1 C6-7 anterolisthesis. SKULL BASE AND VERTEBRAE: Old mild T7 compression fracture. Old T2 mild compression fracture. No acute fracture. Similar moderate to severe C3-4, C4-5 degenerative discs. Multilevel advanced facet arthropathy. Osteopenia without destructive bony lesions. C1-2 articulation maintained with moderate arthropathy. No destructive bony lesions. C1-2 articulation maintained. SOFT TISSUES AND SPINAL CANAL: Nonacute. LEFT parotid sialolith. Moderate calcific atherosclerosis the carotid bifurcations. Diminutive versus resected thyroid. DISC LEVELS: Mild canal stenosis C3-4. Severe C3-4 and C4-5 neural foraminal narrowing, moderate to severe RIGHT C5-6 neural  foraminal narrowing. UPPER CHEST: Lung apices are clear. OTHER: None. IMPRESSION: CT HEAD: 1. No acute intracranial process. Large RIGHT frontal scalp hematoma and laceration. No skull fracture. 2. Stable moderate to severe atrophy and severe chronic small vessel ischemic disease. CT MAXILLOFACIAL: 1. RIGHT pre malar soft tissue swelling without acute facial fracture. 2. Poor dentition. CT CERVICAL SPINE: 1. No acute fracture. 2. Stable chronic changes including C6-7 anterolisthesis and mild old T7 and T2 compression fractures. 3. Severe C3-4 and C4-5 neural foraminal narrowing. Electronically Signed   By: Elon Alas M.D.   On: 11/11/2016 23:29    Procedures Procedures (including critical care time)    Initial Impression / Assessment and Plan / ED Course  I have reviewed the triage vital signs and the nursing notes.  Pertinent labs & imaging results that were available during my care of the patient were reviewed by me and considered in my medical decision making (see chart for details). Chart review notable for 12 emergency department visits in 6 months.   On repeat exam the patient is in no distress, is awake and alert, irritable, cantankerous, but speaking, moving all extremities. Patient not amenable to additional evaluation of small, nonbleeding, avulsion of skin at the right lateral lid margin. The patient's other notable facial lesions are not actively bleeding, will receive dressing, applied by nursing staff prior to discharge. With reassuring CT scans, and given the notation of the patient's baseline dementia, patient is appropriate for discharge with outpatient follow-up.  Final Clinical Impressions(s) / ED Diagnoses   Final diagnoses:  Fall, initial encounter  Contusion of face, initial encounter  Injury of head, initial encounter     Carmin Muskrat, MD 11/12/16 [phone]

## 2016-11-12 DIAGNOSIS — R51 Headache: Secondary | ICD-10-CM | POA: Diagnosis not present

## 2016-11-12 DIAGNOSIS — S0990XA Unspecified injury of head, initial encounter: Secondary | ICD-10-CM | POA: Diagnosis not present

## 2016-11-12 MED ORDER — BACITRACIN ZINC 500 UNIT/GM EX OINT
TOPICAL_OINTMENT | Freq: Once | CUTANEOUS | Status: AC
  Administered 2016-11-12: via TOPICAL
  Filled 2016-11-12: qty 1.8

## 2016-11-12 NOTE — ED Notes (Signed)
[facility] present to transport patient back to facility. 

## 2016-11-12 NOTE — ED Notes (Signed)
D/C report given to [facility]. Areas cleansed and dressed on face and hands.

## 2016-11-22 ENCOUNTER — Emergency Department (HOSPITAL_COMMUNITY)
Admission: EM | Admit: 2016-11-22 | Discharge: 2016-11-22 | Disposition: A | Discharge status: Home / Self Care | Payer: Medicare Other | Attending: Emergency Medicine | Admitting: Emergency Medicine

## 2016-11-22 ENCOUNTER — Encounter (HOSPITAL_COMMUNITY): Payer: Self-pay | Admitting: Emergency Medicine

## 2016-11-22 ENCOUNTER — Emergency Department (HOSPITAL_COMMUNITY): Payer: Medicare Other

## 2016-11-22 DIAGNOSIS — I10 Essential (primary) hypertension: Secondary | ICD-10-CM | POA: Diagnosis not present

## 2016-11-22 DIAGNOSIS — Y998 Other external cause status: Secondary | ICD-10-CM | POA: Diagnosis not present

## 2016-11-22 DIAGNOSIS — W0110XA Fall on same level from slipping, tripping and stumbling with subsequent striking against unspecified object, initial encounter: Secondary | ICD-10-CM | POA: Insufficient documentation

## 2016-11-22 DIAGNOSIS — S0091XA Abrasion of unspecified part of head, initial encounter: Secondary | ICD-10-CM | POA: Insufficient documentation

## 2016-11-22 DIAGNOSIS — F0390 Unspecified dementia without behavioral disturbance: Secondary | ICD-10-CM | POA: Insufficient documentation

## 2016-11-22 DIAGNOSIS — S199XXA Unspecified injury of neck, initial encounter: Secondary | ICD-10-CM | POA: Diagnosis not present

## 2016-11-22 DIAGNOSIS — E039 Hypothyroidism, unspecified: Secondary | ICD-10-CM | POA: Diagnosis not present

## 2016-11-22 DIAGNOSIS — Y929 Unspecified place or not applicable: Secondary | ICD-10-CM | POA: Insufficient documentation

## 2016-11-22 DIAGNOSIS — S0990XA Unspecified injury of head, initial encounter: Secondary | ICD-10-CM

## 2016-11-22 DIAGNOSIS — Y939 Activity, unspecified: Secondary | ICD-10-CM | POA: Insufficient documentation

## 2016-11-22 DIAGNOSIS — R2689 Other abnormalities of gait and mobility: Secondary | ICD-10-CM | POA: Diagnosis not present

## 2016-11-22 DIAGNOSIS — Z23 Encounter for immunization: Secondary | ICD-10-CM | POA: Diagnosis not present

## 2016-11-22 DIAGNOSIS — I4891 Unspecified atrial fibrillation: Secondary | ICD-10-CM | POA: Diagnosis not present

## 2016-11-22 DIAGNOSIS — Z79899 Other long term (current) drug therapy: Secondary | ICD-10-CM | POA: Insufficient documentation

## 2016-11-22 DIAGNOSIS — S0181XA Laceration without foreign body of other part of head, initial encounter: Secondary | ICD-10-CM | POA: Diagnosis not present

## 2016-11-22 MED ORDER — TETANUS-DIPHTH-ACELL PERTUSSIS 5-2.5-18.5 LF-MCG/0.5 IM SUSP
0.5000 mL | Freq: Once | INTRAMUSCULAR | Status: AC
  Administered 2016-11-22: 0.5 mL via INTRAMUSCULAR
  Filled 2016-11-22: qty 0.5

## 2016-11-22 NOTE — ED Provider Notes (Addendum)
[facility] DEPT Provider Note   CSN: [id number] Arrival date & time: 11/22/16  1509     History   Chief Complaint Chief Complaint  Patient presents with  . Fall   Level V caveat dementia HPI Joe [name] is a 83 y.o. male.Level V caveat dementia history is obtained from paramedics and from Ms. Becky Redd care coordinator memory unit where patient resides patient lost his balance and fell 15 minutes prior to arrival here striking his head. He was not using his walker. He has unsteady gait at baseline. He was acting as his normal self prior to the event today. Patient denies pain anywhere.  HPI  Past Medical History:  Diagnosis Date  . Anxiety   . Atrial fibrillation with RVR ([place]) 08/28/2014   [name] 08/28/2014  . BPH (benign prostatic hypertrophy)    [name] 08/28/2014  . Dementia   . Hypertension    [name] 08/28/2014  . Hypothyroidism    [name] 08/28/2014  . Thyroid disease     Patient Active Problem List   Diagnosis Date Noted  . Adjustment disorder with mixed anxiety and depressed mood 11/13/2014  . Cognitive deficits 11/13/2014  . Syncope and collapse 11/10/2014  . Orthostatic hypotension 11/10/2014  . Hypothyroidism 08/28/2014  . Essential hypertension 08/28/2014  . Atrial fibrillation, new onset ([place]) 08/28/2014  . Dizziness 08/28/2014  . BPH (benign prostatic hypertrophy) 08/28/2014  . Diabetes mellitus with no complication ([place]) 19/37/9024  . Risky sexual behavior 08/28/2014  . A-fib ([place]) 08/28/2014  . Hypothyroid 09/10/2013  . BPH (benign prostatic hyperplasia) 09/10/2013    Past Surgical History:  Procedure Laterality Date  . PARS PLANA VITRECTOMY Left 03/30/2013   Procedure: PARS PLANA VITRECTOMY 25 GAUGE FOR ENDOPHTHALMITIS;  Surgeon: Hurman Horn, MD;  Location: [facility];  Service: Ophthalmology;  Laterality: Left;  LOCAL RETROBULBAR,,  USE MARCAINE 0.5%       Home Medications    Prior to Admission medications   Medication Sig Start Date  End Date Taking? Authorizing Provider  ketoconazole (NIZORAL) 2 % shampoo Apply 1 application topically 3 (three) times a week.    [provider]  levothyroxine (SYNTHROID, LEVOTHROID) 112 MCG tablet Take 112 mcg by mouth daily before breakfast.    [provider]  LORazepam (ATIVAN) 0.5 MG tablet Take 5 mg by mouth daily. May take 1 mg q6hprn for anxiety 08/24/16   [provider]  metoprolol tartrate (LOPRESSOR) 25 MG tablet Take 12.5 mg by mouth daily.     [provider]  neomycin-bacitracin-polymyxin (NEOSPORIN) [phone] ointment Apply 1 application topically 2 (two) times daily. To right ear    [provider]  QUEtiapine (SEROQUEL) 50 MG tablet Take 50 mg by mouth 2 (two) times daily.     [provider]  tamsulosin (FLOMAX) 0.4 MG CAPS capsule Take 0.4 mg by mouth daily.    [provider]  TRADJENTA 5 MG TABS tablet Take 5 mg by mouth daily. 08/26/16   [provider]  Vitamin D, Ergocalciferol, (DRISDOL) 50000 units CAPS capsule Take 50,000 Units by mouth every 7 (seven) days.    [provider]    Family History Family History  Problem Relation Age of Onset  . Hyperlipidemia Sister     Social History Social History  Substance Use Topics  . Smoking status: Never Smoker  . Smokeless tobacco: Never Used  . Alcohol use No     Allergies   No known allergies   Review of Systems  Review of Systems  Unable to perform ROS: Dementia  Musculoskeletal: Positive for gait problem.  Skin: Positive for wound.       Wound at right temporal area with hematoma     Physical Exam Updated Vital Signs BP 120/68   Pulse 95   Temp 97.8 F (36.6 C) (Oral)   Resp 16   SpO2 100%   Physical Exam  Constitutional:  Frail chronically ill-appearing  HENT:  Linear abrasion a right temporal area with no active bleeding. With golf ball size hematoma right temporal area. Otherwise normocephalic atraumatic    Eyes: EOM are normal.  Neck: Neck supple. No tracheal deviation present. No thyromegaly present.  No  Cardiovascular: Normal rate and regular rhythm.   No murmur heard. Pulmonary/Chest: Effort normal and breath sounds normal.  Abdominal: Soft. Bowel sounds are normal. He exhibits no distension. There is no tenderness.  Musculoskeletal: Normal range of motion. He exhibits no edema or tenderness.  Entire spine nontender. Pelvis stable nontender. All 4 extremities. 40 or swelling or tenderness neurovascularly intact  Neurological: He is alert. Coordination normal.  . Cranial nerves II through XII grossly intact Follow some simple commands moves all extremities  Skin: Skin is warm and dry. No rash noted.  Psychiatric: He has a normal mood and affect.  Nursing note and vitals reviewed.    ED Treatments / Results  Labs (all labs ordered are listed, but only abnormal results are displayed) Labs Reviewed - No data to display  EKG  EKG Interpretation None       Radiology No results found.  Procedures Procedures (including critical care time)  Medications Ordered in ED Medications  Tdap (BOOSTRIX) injection 0.5 mL (not administered)     Initial Impression / Assessment and Plan / ED Course  I have reviewed the triage vital signs and the nursing notes.  Pertinent labs & imaging results that were available during my care of the patient were reviewed by me and considered in my medical decision making (see chart for details).     TdAP updated. 6:30 PM patient is alert and awake. Answers questions. States he wants to go home. Plan discharge back to memory care unit Final Clinical Impressions(s) / ED Diagnoses  dX #1 fall #2 minor head trauma #3FORE head abrasion Final diagnoses:  None    New Prescriptions New Prescriptions   No medications on file     Orlie Dakin, MD 11/22/16 2951    Orlie Dakin, MD 11/22/16 [phone]

## 2016-11-22 NOTE — ED Notes (Signed)
Pt to CT

## 2016-11-22 NOTE — ED Notes (Signed)
Patient left ED via [facility] at this time.

## 2016-11-22 NOTE — ED Triage Notes (Signed)
Pt at baseline per facillity, pt cooperative. Not on blood thinners.

## 2016-11-22 NOTE — ED Triage Notes (Signed)
Per ems, pt from [facility], hx of dementia, unwitnessed fall, down for around 10 minutes. Hx of multiple falls. Hx of aggressive behavior and confusion. No neck or back pain. Pt has lac to left side of head.

## 2016-11-22 NOTE — Discharge Instructions (Signed)
CT scans of the head and cervical spine showed no injury. Wash the wound on his right forehead daily with soap and water. Signs of infection including redness around the wound, drainage from the wound, pain or fever. If you think he might develop an infection call his doctor or he can return to the emergency department. Please stress to him that he needs to use his walker or a wheelchair to prevent falls

## 2016-11-23 ENCOUNTER — Other Ambulatory Visit: Payer: Self-pay

## 2016-11-23 NOTE — Patient Outreach (Signed)
[facility]) Care Management  11/23/2016  Joe [name] [birth year] [id number]   After reviewing ED notes, patient was discovered to be a resident of [facility].  Telephone call to [facility] to verify residency. Spoke with [name] who reports patient is a resident there.  Plan: RN Health Coach will close case and notify care management assistant of case status.  Jone Baseman, RN, MSN [place] [phone]

## 2016-12-06 ENCOUNTER — Emergency Department (HOSPITAL_COMMUNITY)
Admission: EM | Admit: 2016-12-06 | Discharge: 2016-12-06 | Disposition: A | Discharge status: Home / Self Care | Payer: Medicare Other | Attending: Emergency Medicine | Admitting: Emergency Medicine

## 2016-12-06 ENCOUNTER — Emergency Department (HOSPITAL_COMMUNITY): Payer: Medicare Other

## 2016-12-06 ENCOUNTER — Encounter (HOSPITAL_COMMUNITY): Payer: Self-pay | Admitting: Emergency Medicine

## 2016-12-06 DIAGNOSIS — S0191XA Laceration without foreign body of unspecified part of head, initial encounter: Secondary | ICD-10-CM | POA: Insufficient documentation

## 2016-12-06 DIAGNOSIS — Z79899 Other long term (current) drug therapy: Secondary | ICD-10-CM | POA: Insufficient documentation

## 2016-12-06 DIAGNOSIS — E119 Type 2 diabetes mellitus without complications: Secondary | ICD-10-CM | POA: Diagnosis not present

## 2016-12-06 DIAGNOSIS — I10 Essential (primary) hypertension: Secondary | ICD-10-CM | POA: Diagnosis not present

## 2016-12-06 DIAGNOSIS — S0990XA Unspecified injury of head, initial encounter: Secondary | ICD-10-CM | POA: Diagnosis not present

## 2016-12-06 DIAGNOSIS — S199XXA Unspecified injury of neck, initial encounter: Secondary | ICD-10-CM | POA: Diagnosis not present

## 2016-12-06 DIAGNOSIS — Y92129 Unspecified place in nursing home as the place of occurrence of the external cause: Secondary | ICD-10-CM | POA: Diagnosis not present

## 2016-12-06 DIAGNOSIS — S0101XA Laceration without foreign body of scalp, initial encounter: Secondary | ICD-10-CM | POA: Diagnosis not present

## 2016-12-06 DIAGNOSIS — W1839XA Other fall on same level, initial encounter: Secondary | ICD-10-CM | POA: Diagnosis not present

## 2016-12-06 DIAGNOSIS — S0190XA Unspecified open wound of unspecified part of head, initial encounter: Secondary | ICD-10-CM | POA: Diagnosis not present

## 2016-12-06 DIAGNOSIS — Y999 Unspecified external cause status: Secondary | ICD-10-CM | POA: Insufficient documentation

## 2016-12-06 DIAGNOSIS — Y939 Activity, unspecified: Secondary | ICD-10-CM | POA: Diagnosis not present

## 2016-12-06 DIAGNOSIS — E039 Hypothyroidism, unspecified: Secondary | ICD-10-CM | POA: Insufficient documentation

## 2016-12-06 DIAGNOSIS — F0390 Unspecified dementia without behavioral disturbance: Secondary | ICD-10-CM | POA: Diagnosis not present

## 2016-12-06 LAB — URINALYSIS, ROUTINE W REFLEX MICROSCOPIC
BACTERIA UA: NONE SEEN
BILIRUBIN URINE: NEGATIVE
GLUCOSE, UA: 50 mg/dL — AB
KETONES UR: NEGATIVE mg/dL
LEUKOCYTES UA: NEGATIVE
NITRITE: NEGATIVE
PROTEIN: NEGATIVE mg/dL
Specific Gravity, Urine: 1.021 (ref 1.005–1.030)
Squamous Epithelial / LPF: NONE SEEN
pH: 5 (ref 5.0–8.0)

## 2016-12-06 LAB — CBC WITH DIFFERENTIAL/PLATELET
BASOS ABS: 0 10*3/uL (ref 0.0–0.1)
Basophils Relative: 0 %
EOS ABS: 0.1 10*3/uL (ref 0.0–0.7)
EOS PCT: 0 %
HCT: 31.2 % — ABNORMAL LOW (ref 39.0–52.0)
Hemoglobin: 10.5 g/dL — ABNORMAL LOW (ref 13.0–17.0)
LYMPHS ABS: 1.5 10*3/uL (ref 0.7–4.0)
Lymphocytes Relative: 11 %
MCH: 31.3 pg (ref 26.0–34.0)
MCHC: 33.7 g/dL (ref 30.0–36.0)
MCV: 93.1 fL (ref 78.0–100.0)
MONO ABS: 1 10*3/uL (ref 0.1–1.0)
Monocytes Relative: 8 %
Neutro Abs: 11 10*3/uL — ABNORMAL HIGH (ref 1.7–7.7)
Neutrophils Relative %: 81 %
PLATELETS: 256 10*3/uL (ref 150–400)
RBC: 3.35 MIL/uL — AB (ref 4.22–5.81)
RDW: 15.3 % (ref 11.5–15.5)
WBC: 13.6 10*3/uL — ABNORMAL HIGH (ref 4.0–10.5)

## 2016-12-06 LAB — COMPREHENSIVE METABOLIC PANEL
ALT: 10 U/L — AB (ref 17–63)
AST: 15 U/L (ref 15–41)
Albumin: 2.4 g/dL — ABNORMAL LOW (ref 3.5–5.0)
Alkaline Phosphatase: 149 U/L — ABNORMAL HIGH (ref 38–126)
Anion gap: 8 (ref 5–15)
BUN: 16 mg/dL (ref 6–20)
CHLORIDE: 104 mmol/L (ref 101–111)
CO2: 26 mmol/L (ref 22–32)
CREATININE: 0.93 mg/dL (ref 0.61–1.24)
Calcium: 7.9 mg/dL — ABNORMAL LOW (ref 8.9–10.3)
Glucose, Bld: 182 mg/dL — ABNORMAL HIGH (ref 65–99)
POTASSIUM: 3.4 mmol/L — AB (ref 3.5–5.1)
Sodium: 138 mmol/L (ref 135–145)
Total Bilirubin: 0.5 mg/dL (ref 0.3–1.2)
Total Protein: 5.9 g/dL — ABNORMAL LOW (ref 6.5–8.1)

## 2016-12-06 MED ORDER — BACITRACIN ZINC 500 UNIT/GM EX OINT
TOPICAL_OINTMENT | Freq: Two times a day (BID) | CUTANEOUS | Status: DC
  Administered 2016-12-06: 1 via TOPICAL
  Filled 2016-12-06: qty 0.9

## 2016-12-06 MED ORDER — BACITRACIN ZINC 500 UNIT/GM EX OINT: 1.0000 "application " | TOPICAL_OINTMENT | Freq: Two times a day (BID) | CUTANEOUS | 0 refills | Status: DC

## 2016-12-06 NOTE — ED Notes (Signed)
Bed: QZ83 Expected date:  Expected time:  Means of arrival:  Comments: EMS 83 y/o fall, dementia

## 2016-12-06 NOTE — ED Triage Notes (Addendum)
Per EMS, pt coming from [facility] had an unwitnessed fall lastnight around 9pm. EMS arrived pt has a hematoma to back of head with a 1 inch laceration. Pt has history of dementia but alert and oriented to baseline. No history of blood thinners. Pt walks with walker

## 2016-12-06 NOTE — ED Provider Notes (Signed)
[facility] DEPT Provider Note   CSN: [id number] Arrival date & time: 12/06/16  0855     History   Chief Complaint Chief Complaint  Patient presents with  . Fall    HPI Joe [name] is a 83 y.o. male.  HPI Patient with history of dementia. Unable to contribute history. Level V caveat applies. Patient coming from skilled nursing facility by EMS for a fall yesterday evening. Staff estimated fall occurred around 9 PM. Sustained laceration to the back of the head. Uses walker for ambulation. Past Medical History:  Diagnosis Date  . Anxiety   . Atrial fibrillation with RVR ([place]) 08/28/2014   [name] 08/28/2014  . BPH (benign prostatic hypertrophy)    [name] 08/28/2014  . Dementia   . Hypertension    [name] 08/28/2014  . Hypothyroidism    [name] 08/28/2014  . Thyroid disease     Patient Active Problem List   Diagnosis Date Noted  . Adjustment disorder with mixed anxiety and depressed mood 11/13/2014  . Cognitive deficits 11/13/2014  . Syncope and collapse 11/10/2014  . Orthostatic hypotension 11/10/2014  . Hypothyroidism 08/28/2014  . Essential hypertension 08/28/2014  . Atrial fibrillation, new onset ([place]) 08/28/2014  . Dizziness 08/28/2014  . BPH (benign prostatic hypertrophy) 08/28/2014  . Diabetes mellitus with no complication ([place]) 86/76/1948  . Risky sexual behavior 08/28/2014  . A-fib ([place]) 08/28/2014  . Hypothyroid 09/10/2013  . BPH (benign prostatic hyperplasia) 09/10/2013    Past Surgical History:  Procedure Laterality Date  . PARS PLANA VITRECTOMY Left 03/30/2013   Procedure: PARS PLANA VITRECTOMY 25 GAUGE FOR ENDOPHTHALMITIS;  Surgeon: Hurman Horn, MD;  Location: [facility];  Service: Ophthalmology;  Laterality: Left;  LOCAL RETROBULBAR,,  USE MARCAINE 0.5%       Home Medications    Prior to Admission medications   Medication Sig Start Date End Date Taking? Authorizing Provider  bacitracin ointment Apply 1 application topically 2 (two) times daily.  To scalp laceration 12/06/16   Julianne Rice, MD  ketoconazole (NIZORAL) 2 % shampoo Apply 1 application topically 3 (three) times a week.    [provider]  levothyroxine (SYNTHROID, LEVOTHROID) 112 MCG tablet Take 112 mcg by mouth daily before breakfast.    [provider]  LORazepam (ATIVAN) 0.5 MG tablet Take 5 mg by mouth daily. May take 1 mg q6hprn for anxiety 08/24/16   [provider]  metoprolol tartrate (LOPRESSOR) 25 MG tablet Take 12.5 mg by mouth daily.     [provider]  neomycin-bacitracin-polymyxin (NEOSPORIN) [id number] ointment Apply 1 application topically 2 (two) times daily. To right ear    [provider]  QUEtiapine (SEROQUEL) 50 MG tablet Take 50 mg by mouth 2 (two) times daily.     [provider]  tamsulosin (FLOMAX) 0.4 MG CAPS capsule Take 0.4 mg by mouth daily.    [provider]  TRADJENTA 5 MG TABS tablet Take 5 mg by mouth daily. 08/26/16   [provider]  Vitamin D, Ergocalciferol, (DRISDOL) 50000 units CAPS capsule Take 50,000 Units by mouth every 7 (seven) days.    [provider]    Family History Family History  Problem Relation Age of Onset  . Hyperlipidemia Sister     Social History Social History  Substance Use Topics  . Smoking status: Never Smoker  . Smokeless tobacco: Never Used  . Alcohol use No     Allergies   No known allergies   Review of Systems Review of  Systems  Unable to perform ROS: Dementia     Physical Exam Updated Vital Signs BP 121/61 (BP Location: Right Arm)   Pulse 88   Temp 97.7 F (36.5 C) (Oral)   Resp 20   SpO2 98%   Physical Exam  Constitutional: He appears well-developed and well-nourished. No distress.  HENT:  Head: Normocephalic.  Mouth/Throat: Oropharynx is clear and moist.  Subacute contusion to the right temporal region. Patient has posterior scalp hematoma with 1 cm laceration. No active bleeding. No underlying  bony deformity.  Eyes: Pupils are equal, round, and reactive to light. EOM are normal.  Neck:  No definite midline cervical tenderness to palpation.  Cardiovascular: Normal rate and regular rhythm.  Exam reveals no gallop and no friction rub.   No murmur heard. Pulmonary/Chest: Effort normal and breath sounds normal. No respiratory distress. He has no wheezes. He has no rales. He exhibits no tenderness.  Abdominal: Soft. Bowel sounds are normal. There is no tenderness. There is no rebound and no guarding.  Musculoskeletal: Normal range of motion. He exhibits no edema or tenderness.  Pelvis is stable. Cogwheel rigidity throughout. Distal pulses 2+.  Neurological: He is alert.  Appears to be moving all extremities without focal deficit. Intermittently following commands.  Skin: Skin is warm and dry. Capillary refill takes less than 2 seconds. No rash noted. He is not diaphoretic. No erythema.  Psychiatric: He has a normal mood and affect. His behavior is normal.  Nursing note and vitals reviewed.    ED Treatments / Results  Labs (all labs ordered are listed, but only abnormal results are displayed) Labs Reviewed  CBC WITH DIFFERENTIAL/PLATELET - Abnormal; Notable for the following:       Result Value   WBC 13.6 (*)    RBC 3.35 (*)    Hemoglobin 10.5 (*)    HCT 31.2 (*)    Neutro Abs 11.0 (*)    All other components within normal limits  COMPREHENSIVE METABOLIC PANEL - Abnormal; Notable for the following:    Potassium 3.4 (*)    Glucose, Bld 182 (*)    Calcium 7.9 (*)    Total Protein 5.9 (*)    Albumin 2.4 (*)    ALT 10 (*)    Alkaline Phosphatase 149 (*)    All other components within normal limits  URINALYSIS, ROUTINE W REFLEX MICROSCOPIC - Abnormal; Notable for the following:    Glucose, UA 50 (*)    Hgb urine dipstick MODERATE (*)    All other components within normal limits    EKG  EKG Interpretation None       Radiology Ct Head Wo Contrast  Result Date:  12/06/2016 CLINICAL DATA:  Head trauma, minor. Unwitnessed fall last night. Posterior scalp laceration. Initial encounter. EXAM: CT HEAD WITHOUT CONTRAST CT CERVICAL SPINE WITHOUT CONTRAST TECHNIQUE: Multidetector CT imaging of the head and cervical spine was performed following the standard protocol without intravenous contrast. Multiplanar CT image reconstructions of the cervical spine were also generated. COMPARISON:  11/22/2016 FINDINGS: CT HEAD FINDINGS Brain: No evidence of acute infarction, hemorrhage, hydrocephalus, or mass. Asymmetric prominence of CSF along the right aspect of the cerebellum which may reflect a small hygroma. Generalized atrophy and confluent chronic small vessel disease in the cerebral white matter. Vascular: Atherosclerotic calcification.  No hyperdense vessel. Skull: No acute or aggressive finding.  Occipital scalp hematoma. Sinuses/Orbits: No evidence of injury. Bilateral cataract resection. CT CERVICAL SPINE FINDINGS Alignment: Reversal of cervical lordosis. Mild anterolisthesis at  C6-7, chronic and degenerative appearing. Skull base and vertebrae: Remote fractures of the C7 and T2 bodies with mild height loss. No acute fracture. Soft tissues and spinal canal: No prevertebral fluid or swelling. No visible canal hematoma. Atherosclerotic calcifications. Atrophic thyroid. Disc levels:  Diffuse disc and facet degeneration.  C2-3 ankylosis. Upper chest: No acute finding IMPRESSION: 1. No evidence of acute intracranial or cervical spine injury. 2. Scalp contusion without fracture. 3. Atrophy and extensive chronic small vessel ischemia. Electronically Signed   By: Monte Fantasia M.D.   On: 12/06/2016 11:19   Ct Cervical Spine Wo Contrast  Result Date: 12/06/2016 CLINICAL DATA:  Head trauma, minor. Unwitnessed fall last night. Posterior scalp laceration. Initial encounter. EXAM: CT HEAD WITHOUT CONTRAST CT CERVICAL SPINE WITHOUT CONTRAST TECHNIQUE: Multidetector CT imaging of the head and  cervical spine was performed following the standard protocol without intravenous contrast. Multiplanar CT image reconstructions of the cervical spine were also generated. COMPARISON:  11/22/2016 FINDINGS: CT HEAD FINDINGS Brain: No evidence of acute infarction, hemorrhage, hydrocephalus, or mass. Asymmetric prominence of CSF along the right aspect of the cerebellum which may reflect a small hygroma. Generalized atrophy and confluent chronic small vessel disease in the cerebral white matter. Vascular: Atherosclerotic calcification.  No hyperdense vessel. Skull: No acute or aggressive finding.  Occipital scalp hematoma. Sinuses/Orbits: No evidence of injury. Bilateral cataract resection. CT CERVICAL SPINE FINDINGS Alignment: Reversal of cervical lordosis. Mild anterolisthesis at C6-7, chronic and degenerative appearing. Skull base and vertebrae: Remote fractures of the C7 and T2 bodies with mild height loss. No acute fracture. Soft tissues and spinal canal: No prevertebral fluid or swelling. No visible canal hematoma. Atherosclerotic calcifications. Atrophic thyroid. Disc levels:  Diffuse disc and facet degeneration.  C2-3 ankylosis. Upper chest: No acute finding IMPRESSION: 1. No evidence of acute intracranial or cervical spine injury. 2. Scalp contusion without fracture. 3. Atrophy and extensive chronic small vessel ischemia. Electronically Signed   By: Monte Fantasia M.D.   On: 12/06/2016 11:19    Procedures Procedures (including critical care time)  Medications Ordered in ED Medications  bacitracin ointment (not administered)     Initial Impression / Assessment and Plan / ED Course  I have reviewed the triage vital signs and the nursing notes.  Pertinent labs & imaging results that were available during my care of the patient were reviewed by me and considered in my medical decision making (see chart for details).     Workup was essentially negative. Patient does have a leukocytosis of unknown  significance. Scalp laceration is older than 12 hours. We'll let heal by secondary intention. We will clean in the emergency department apply antibiotic ointment. Patient appears to be at his baseline mental status. Will discharge back to nursing home. Advised to follow-up with his primary physician.  Final Clinical Impressions(s) / ED Diagnoses   Final diagnoses:  Laceration of scalp, initial encounter  Closed head injury, initial encounter    New Prescriptions New Prescriptions   BACITRACIN OINTMENT    Apply 1 application topically 2 (two) times daily. To scalp laceration     Julianne Rice, MD 12/06/16 1442

## 2017-01-25 ENCOUNTER — Encounter (HOSPITAL_COMMUNITY): Payer: Self-pay | Admitting: Emergency Medicine

## 2017-01-25 ENCOUNTER — Emergency Department (HOSPITAL_COMMUNITY)
Admission: EM | Admit: 2017-01-25 | Discharge: 2017-01-25 | Disposition: A | Discharge status: Home / Self Care | Payer: Medicare Other | Source: Home / Self Care | Attending: Emergency Medicine | Admitting: Emergency Medicine

## 2017-01-25 ENCOUNTER — Emergency Department (HOSPITAL_COMMUNITY): Payer: Medicare Other

## 2017-01-25 DIAGNOSIS — R296 Repeated falls: Secondary | ICD-10-CM | POA: Diagnosis not present

## 2017-01-25 DIAGNOSIS — E43 Unspecified severe protein-calorie malnutrition: Secondary | ICD-10-CM | POA: Diagnosis not present

## 2017-01-25 DIAGNOSIS — S098XXA Other specified injuries of head, initial encounter: Secondary | ICD-10-CM | POA: Diagnosis not present

## 2017-01-25 DIAGNOSIS — E119 Type 2 diabetes mellitus without complications: Secondary | ICD-10-CM | POA: Insufficient documentation

## 2017-01-25 DIAGNOSIS — I951 Orthostatic hypotension: Secondary | ICD-10-CM | POA: Diagnosis not present

## 2017-01-25 DIAGNOSIS — R410 Disorientation, unspecified: Secondary | ICD-10-CM | POA: Diagnosis not present

## 2017-01-25 DIAGNOSIS — W19XXXA Unspecified fall, initial encounter: Secondary | ICD-10-CM | POA: Insufficient documentation

## 2017-01-25 DIAGNOSIS — S0990XA Unspecified injury of head, initial encounter: Secondary | ICD-10-CM | POA: Diagnosis not present

## 2017-01-25 DIAGNOSIS — Y92129 Unspecified place in nursing home as the place of occurrence of the external cause: Secondary | ICD-10-CM

## 2017-01-25 DIAGNOSIS — S199XXA Unspecified injury of neck, initial encounter: Secondary | ICD-10-CM | POA: Diagnosis not present

## 2017-01-25 DIAGNOSIS — Y999 Unspecified external cause status: Secondary | ICD-10-CM | POA: Insufficient documentation

## 2017-01-25 DIAGNOSIS — S0101XA Laceration without foreign body of scalp, initial encounter: Secondary | ICD-10-CM

## 2017-01-25 DIAGNOSIS — Y939 Activity, unspecified: Secondary | ICD-10-CM

## 2017-01-25 DIAGNOSIS — S0083XA Contusion of other part of head, initial encounter: Secondary | ICD-10-CM | POA: Diagnosis not present

## 2017-01-25 DIAGNOSIS — I10 Essential (primary) hypertension: Secondary | ICD-10-CM | POA: Insufficient documentation

## 2017-01-25 DIAGNOSIS — S61411A Laceration without foreign body of right hand, initial encounter: Secondary | ICD-10-CM

## 2017-01-25 DIAGNOSIS — E039 Hypothyroidism, unspecified: Secondary | ICD-10-CM

## 2017-01-25 DIAGNOSIS — S161XXA Strain of muscle, fascia and tendon at neck level, initial encounter: Secondary | ICD-10-CM

## 2017-01-25 DIAGNOSIS — S0510XA Contusion of eyeball and orbital tissues, unspecified eye, initial encounter: Secondary | ICD-10-CM | POA: Diagnosis not present

## 2017-01-25 DIAGNOSIS — Z79899 Other long term (current) drug therapy: Secondary | ICD-10-CM

## 2017-01-25 DIAGNOSIS — N400 Enlarged prostate without lower urinary tract symptoms: Secondary | ICD-10-CM | POA: Diagnosis not present

## 2017-01-25 DIAGNOSIS — N390 Urinary tract infection, site not specified: Secondary | ICD-10-CM | POA: Diagnosis not present

## 2017-01-25 DIAGNOSIS — S0993XA Unspecified injury of face, initial encounter: Secondary | ICD-10-CM | POA: Diagnosis not present

## 2017-01-25 DIAGNOSIS — G8911 Acute pain due to trauma: Secondary | ICD-10-CM | POA: Diagnosis not present

## 2017-01-25 DIAGNOSIS — A419 Sepsis, unspecified organism: Secondary | ICD-10-CM | POA: Diagnosis not present

## 2017-01-25 DIAGNOSIS — S51019A Laceration without foreign body of unspecified elbow, initial encounter: Secondary | ICD-10-CM

## 2017-01-25 DIAGNOSIS — Z7984 Long term (current) use of oral hypoglycemic drugs: Secondary | ICD-10-CM | POA: Diagnosis not present

## 2017-01-25 DIAGNOSIS — I482 Chronic atrial fibrillation: Secondary | ICD-10-CM | POA: Diagnosis not present

## 2017-01-25 DIAGNOSIS — Z66 Do not resuscitate: Secondary | ICD-10-CM | POA: Diagnosis not present

## 2017-01-25 HISTORY — DX: Insomnia, unspecified: G47.00

## 2017-01-25 HISTORY — DX: Type 2 diabetes mellitus without complications: E11.9

## 2017-01-25 MED ORDER — LIDOCAINE HCL 1 % IJ SOLN
INTRAMUSCULAR | Status: AC
  Administered 2017-01-25: 20 mL
  Filled 2017-01-25: qty 20

## 2017-01-25 MED ORDER — BACITRACIN ZINC 500 UNIT/GM EX OINT
TOPICAL_OINTMENT | CUTANEOUS | Status: AC
  Administered 2017-01-25: 4
  Filled 2017-01-25: qty 0.9

## 2017-01-25 NOTE — Discharge Instructions (Signed)
Staples are to be removed in 7 days.  Return to the emergency department for any new or concerning symptoms.

## 2017-01-25 NOTE — ED Triage Notes (Signed)
Patient BIB [facility] from [facility] for fall. Pt known to wander facility at night, was found on floor in an open room, unwitnessed fall onto a bed frame. Pt has evulsion to scalp, hematoma under RT eye, skin tear to both left and right elbow. Facility told EMS that they think pt takes a blood thinner but were unable to confirm because they just moved med room and could not find all his paperwork. Pt is followed by [facility]. Facility attempted to call pts sister x3 but unsuccessful. Pt A/O x2 person and place at baseline.

## 2017-01-25 NOTE — ED Notes (Signed)
[facility] called for transportation back to facility 

## 2017-01-25 NOTE — ED Provider Notes (Signed)
[facility] DEPT Provider Note   CSN: [id number] Arrival date & time: 01/25/17  0415     History   Chief Complaint Chief Complaint  Patient presents with  . Fall    HPI Joe [name] is a 83 y.o. male.  Patient is an 83 year old male with past medical history of dementia, diabetes, hypertension, and A. fib with RVR. He presents today for evaluation of fall. He was found on the floor at his nursing home with a large laceration to the top of the head and abrasions to the face, hand, and arm. The patient adds little additional history secondary to history of dementia.   The history is provided by the patient and the EMS personnel.  Fall  This is a new problem. The current episode started less than 1 hour ago. The problem occurs constantly. The problem has not changed since onset.Pertinent negatives include no chest pain, no abdominal pain, no headaches and no shortness of breath. Nothing aggravates the symptoms. Nothing relieves the symptoms. He has tried nothing for the symptoms.    Past Medical History:  Diagnosis Date  . Anxiety   . Atrial fibrillation with RVR ([place]) 08/28/2014   [name] 08/28/2014  . BPH (benign prostatic hypertrophy)    [name] 08/28/2014  . Dementia   . Diabetes mellitus without complication ([place])   . Hypertension    [name] 08/28/2014  . Hypothyroidism    [name] 08/28/2014  . Insomnia   . Thyroid disease     Patient Active Problem List   Diagnosis Date Noted  . Adjustment disorder with mixed anxiety and depressed mood 11/13/2014  . Cognitive deficits 11/13/2014  . Syncope and collapse 11/10/2014  . Orthostatic hypotension 11/10/2014  . Hypothyroidism 08/28/2014  . Essential hypertension 08/28/2014  . Atrial fibrillation, new onset ([place]) 08/28/2014  . Dizziness 08/28/2014  . BPH (benign prostatic hypertrophy) 08/28/2014  . Diabetes mellitus with no complication ([place]) 40/98/1191  . Risky sexual behavior 08/28/2014  . A-fib ([place]) 08/28/2014  .  Hypothyroid 09/10/2013  . BPH (benign prostatic hyperplasia) 09/10/2013    Past Surgical History:  Procedure Laterality Date  . PARS PLANA VITRECTOMY Left 03/30/2013   Procedure: PARS PLANA VITRECTOMY 25 GAUGE FOR ENDOPHTHALMITIS;  Surgeon: Hurman Horn, MD;  Location: [facility];  Service: Ophthalmology;  Laterality: Left;  LOCAL RETROBULBAR,,  USE MARCAINE 0.5%       Home Medications    Prior to Admission medications   Medication Sig Start Date End Date Taking? Authorizing Provider  bacitracin ointment Apply 1 application topically 2 (two) times daily. To scalp laceration 12/06/16   Julianne Rice, MD  ketoconazole (NIZORAL) 2 % shampoo Apply 1 application topically 3 (three) times a week.    [provider]  levothyroxine (SYNTHROID, LEVOTHROID) 112 MCG tablet Take 112 mcg by mouth daily before breakfast.    [provider]  LORazepam (ATIVAN) 0.5 MG tablet Take 5 mg by mouth daily. May take 1 mg q6hprn for anxiety 08/24/16   [provider]  metoprolol tartrate (LOPRESSOR) 25 MG tablet Take 12.5 mg by mouth daily.     [provider]  neomycin-bacitracin-polymyxin (NEOSPORIN) [id number] ointment Apply 1 application topically 2 (two) times daily. To right ear    [provider]  QUEtiapine (SEROQUEL) 50 MG tablet Take 50 mg by mouth 2 (two) times daily.     [provider]  tamsulosin (FLOMAX) 0.4 MG CAPS capsule Take 0.4 mg by mouth daily.    [provider]  TRADJENTA 5 MG TABS tablet Take 5 mg by mouth daily. 08/26/16   [provider]  Vitamin D, Ergocalciferol, (DRISDOL) 50000 units CAPS capsule Take 50,000 Units by mouth every 7 (seven) days.    [provider]    Family History Family History  Problem Relation Age of Onset  . Hyperlipidemia Sister     Social History Social History  Substance Use Topics  . Smoking status: Never Smoker  . Smokeless tobacco: Never Used  . Alcohol use No      Allergies   No known allergies   Review of Systems Review of Systems  Respiratory: Negative for shortness of breath.   Cardiovascular: Negative for chest pain.  Gastrointestinal: Negative for abdominal pain.  Neurological: Negative for headaches.  All other systems reviewed and are negative.    Physical Exam Updated Vital Signs BP (!) 145/90   Pulse (!) 113   Resp 18   SpO2 95%   Physical Exam  Constitutional: He is oriented to person, place, and time. He appears well-developed and well-nourished. No distress.  HENT:  Head: Normocephalic.  Mouth/Throat: Oropharynx is clear and moist.  There is a large, V-shaped, degloving laceration to the top of the head measuring approximately 15 cm. There are also superficial abrasions below the right eye and bridge of the nose.  Eyes: Pupils are equal, round, and reactive to light.  There is swelling and ecchymosis in the soft tissues around the right eye. There is no hyphema. Extraocular movements are intact without evidence for entrapment.  Neck: Normal range of motion. Neck supple.  Cardiovascular: Normal rate and regular rhythm.  Exam reveals no friction rub.   No murmur heard. Pulmonary/Chest: Effort normal and breath sounds normal. No respiratory distress. He has no wheezes. He has no rales.  Abdominal: Soft. Bowel sounds are normal. He exhibits no distension. There is no tenderness.  Musculoskeletal: Normal range of motion. He exhibits no edema.  Pelvis is stable. There is no pain of the hips with internal or external rotation.  There is a laceration to the thenar aspect of the right hand, however is superficial and does not require sutures.  Neurological: He is alert and oriented to person, place, and time. Coordination normal.  Patient is awake, alert, and at his neurologic baseline. His exam is somewhat difficult secondary to him being uncooperative and demented. He moves all extremities.  Skin: Skin is warm and dry. He  is not diaphoretic.  Nursing note and vitals reviewed.    ED Treatments / Results  Labs (all labs ordered are listed, but only abnormal results are displayed) Labs Reviewed - No data to display  EKG  EKG Interpretation None       Radiology No results found.  Procedures Procedures (including critical care time)  Medications Ordered in ED Medications  lidocaine (XYLOCAINE) 1 % (with pres) injection (20 mLs  Given 01/25/17 0448)     Initial Impression / Assessment and Plan / ED Course  I have reviewed the triage vital signs and the nursing notes.  Pertinent labs & imaging results that were available during my care of the patient were reviewed by me and considered in my medical decision making (see chart for details).  Patient presents for evaluation of injuries sustained in a fall at his nursing home. He was found on the floor with a large laceration to his scalp and a smaller laceration to the right cheek. He also has soft tissue swelling surrounding the right eye.  The scalp laceration/avulsion was repaired with staples as per my procedure note. I see no indication for suturing of the cheek. He does have small skin tears elsewhere on his extremities, however none of these are in need of repair.  Imaging studies revealed no intracranial injury, no maxillofacial fractures, and no cervical spine fracture or misalignment.  He will be discharged, to undergo staple removal in one week.  LACERATION REPAIR Performed by: Veryl Speak Authorized by: Veryl Speak Consent: Verbal consent obtained. Risks and benefits: risks, benefits and alternatives were discussed Consent given by: patient Patient identity confirmed: provided demographic data Prepped and Draped in normal sterile fashion Wound explored  Laceration Location: Scalp  Laceration Length: 12 cm  No Foreign Bodies seen or palpated  Anesthesia: local infiltration  Local anesthetic: lidocaine 1 % without  epinephrine  Anesthetic total: 8 ml  Irrigation method: syringe Amount of cleaning: standard  Skin closure: Staples   Number of sutures: 11   Technique: Staples   Patient tolerance: Patient tolerated the procedure well with no immediate complications.   Final Clinical Impressions(s) / ED Diagnoses   Final diagnoses:  None    New Prescriptions New Prescriptions   No medications on file     Veryl Speak, MD 01/25/17 [phone]

## 2017-01-25 NOTE — ED Notes (Signed)
Report given to staff at [facility] and pt ready for transport

## 2017-01-25 NOTE — ED Notes (Signed)
Bed: KB52 Expected date:  Expected time:  Means of arrival:  Comments: 83 yr old, fall, multiple abrasions

## 2017-01-27 ENCOUNTER — Inpatient Hospital Stay (HOSPITAL_COMMUNITY)
Admission: EM | Admit: 2017-01-27 | Discharge: 2017-02-03 | DRG: 871 | Disposition: A | Discharge status: Skilled Nursing Facility | Payer: Medicare Other | Attending: Internal Medicine | Admitting: Internal Medicine

## 2017-01-27 DIAGNOSIS — E119 Type 2 diabetes mellitus without complications: Secondary | ICD-10-CM

## 2017-01-27 DIAGNOSIS — I10 Essential (primary) hypertension: Secondary | ICD-10-CM | POA: Diagnosis present

## 2017-01-27 DIAGNOSIS — E43 Unspecified severe protein-calorie malnutrition: Secondary | ICD-10-CM | POA: Insufficient documentation

## 2017-01-27 DIAGNOSIS — A419 Sepsis, unspecified organism: Secondary | ICD-10-CM | POA: Diagnosis not present

## 2017-01-27 DIAGNOSIS — Z681 Body mass index (BMI) 19 or less, adult: Secondary | ICD-10-CM | POA: Diagnosis not present

## 2017-01-27 DIAGNOSIS — N400 Enlarged prostate without lower urinary tract symptoms: Secondary | ICD-10-CM | POA: Diagnosis present

## 2017-01-27 DIAGNOSIS — I482 Chronic atrial fibrillation: Secondary | ICD-10-CM | POA: Diagnosis present

## 2017-01-27 DIAGNOSIS — M5489 Other dorsalgia: Secondary | ICD-10-CM | POA: Diagnosis not present

## 2017-01-27 DIAGNOSIS — T148XXA Other injury of unspecified body region, initial encounter: Secondary | ICD-10-CM | POA: Diagnosis not present

## 2017-01-27 DIAGNOSIS — F0391 Unspecified dementia with behavioral disturbance: Secondary | ICD-10-CM | POA: Diagnosis present

## 2017-01-27 DIAGNOSIS — Z66 Do not resuscitate: Secondary | ICD-10-CM | POA: Diagnosis present

## 2017-01-27 DIAGNOSIS — I951 Orthostatic hypotension: Secondary | ICD-10-CM | POA: Diagnosis present

## 2017-01-27 DIAGNOSIS — R410 Disorientation, unspecified: Secondary | ICD-10-CM

## 2017-01-27 DIAGNOSIS — I4891 Unspecified atrial fibrillation: Secondary | ICD-10-CM | POA: Diagnosis present

## 2017-01-27 DIAGNOSIS — R296 Repeated falls: Secondary | ICD-10-CM | POA: Diagnosis present

## 2017-01-27 DIAGNOSIS — Z79899 Other long term (current) drug therapy: Secondary | ICD-10-CM

## 2017-01-27 DIAGNOSIS — E039 Hypothyroidism, unspecified: Secondary | ICD-10-CM | POA: Diagnosis present

## 2017-01-27 DIAGNOSIS — N390 Urinary tract infection, site not specified: Secondary | ICD-10-CM | POA: Diagnosis not present

## 2017-01-27 DIAGNOSIS — Z7984 Long term (current) use of oral hypoglycemic drugs: Secondary | ICD-10-CM

## 2017-01-27 DIAGNOSIS — R55 Syncope and collapse: Secondary | ICD-10-CM | POA: Diagnosis present

## 2017-01-27 LAB — URINALYSIS, ROUTINE W REFLEX MICROSCOPIC
Bilirubin Urine: NEGATIVE
GLUCOSE, UA: NEGATIVE mg/dL
HGB URINE DIPSTICK: NEGATIVE
KETONES UR: NEGATIVE mg/dL
Nitrite: POSITIVE — AB
PH: 6 (ref 5.0–8.0)
PROTEIN: 100 mg/dL — AB
SQUAMOUS EPITHELIAL / LPF: NONE SEEN
Specific Gravity, Urine: 1.017 (ref 1.005–1.030)

## 2017-01-27 LAB — I-STAT CHEM 8, ED
BUN: 25 mg/dL — ABNORMAL HIGH (ref 6–20)
CALCIUM ION: 1.03 mmol/L — AB (ref 1.15–1.40)
CHLORIDE: 102 mmol/L (ref 101–111)
CREATININE: 1 mg/dL (ref 0.61–1.24)
GLUCOSE: 150 mg/dL — AB (ref 65–99)
HCT: 28 % — ABNORMAL LOW (ref 39.0–52.0)
Hemoglobin: 9.5 g/dL — ABNORMAL LOW (ref 13.0–17.0)
POTASSIUM: 3.9 mmol/L (ref 3.5–5.1)
Sodium: 137 mmol/L (ref 135–145)
TCO2: 27 mmol/L (ref 22–32)

## 2017-01-27 LAB — CBC WITH DIFFERENTIAL/PLATELET
Basophils Absolute: 0 10*3/uL (ref 0.0–0.1)
Basophils Relative: 0 %
EOS ABS: 0.1 10*3/uL (ref 0.0–0.7)
EOS PCT: 1 %
HCT: 28.7 % — ABNORMAL LOW (ref 39.0–52.0)
Hemoglobin: 9.8 g/dL — ABNORMAL LOW (ref 13.0–17.0)
Lymphocytes Relative: 13 %
Lymphs Abs: 1.2 10*3/uL (ref 0.7–4.0)
MCH: 31.3 pg (ref 26.0–34.0)
MCHC: 34.1 g/dL (ref 30.0–36.0)
MCV: 91.7 fL (ref 78.0–100.0)
MONO ABS: 0.8 10*3/uL (ref 0.1–1.0)
Monocytes Relative: 9 %
Neutro Abs: 7 10*3/uL (ref 1.7–7.7)
Neutrophils Relative %: 77 %
PLATELETS: 251 10*3/uL (ref 150–400)
RBC: 3.13 MIL/uL — ABNORMAL LOW (ref 4.22–5.81)
RDW: 15.1 % (ref 11.5–15.5)
WBC: 9.1 10*3/uL (ref 4.0–10.5)

## 2017-01-27 LAB — CBG MONITORING, ED
Glucose-Capillary: 169 mg/dL — ABNORMAL HIGH (ref 65–99)
Glucose-Capillary: 150 mg/dL — ABNORMAL HIGH (ref 65–99)

## 2017-01-27 LAB — LACTIC ACID, PLASMA: Lactic Acid, Venous: 1.2 mmol/L (ref 0.5–1.9)

## 2017-01-27 MED ORDER — ENOXAPARIN SODIUM 40 MG/0.4ML ~~LOC~~ SOLN
40.0000 mg | SUBCUTANEOUS | Status: DC
  Administered 2017-01-27: 40 mg via SUBCUTANEOUS
  Filled 2017-01-27: qty 0.4

## 2017-01-27 MED ORDER — INSULIN ASPART 100 UNIT/ML ~~LOC~~ SOLN
0.0000 [IU] | Freq: Every day | SUBCUTANEOUS | Status: DC
  Administered 2017-01-31: 22:00:00 2 [IU] via SUBCUTANEOUS
  Filled 2017-01-27 (×2): qty 1

## 2017-01-27 MED ORDER — DEXTROSE 5 % IV SOLN
1.0000 g | INTRAVENOUS | Status: DC
  Administered 2017-01-28 – 2017-01-29 (×2): 1 g via INTRAVENOUS
  Filled 2017-01-27 (×3): qty 10

## 2017-01-27 MED ORDER — ACETAMINOPHEN 325 MG PO TABS
650.0000 mg | ORAL_TABLET | Freq: Four times a day (QID) | ORAL | Status: DC | PRN
  Administered 2017-01-27 – 2017-02-02 (×3): 650 mg via ORAL
  Filled 2017-01-27 (×3): qty 2

## 2017-01-27 MED ORDER — METOPROLOL TARTRATE 12.5 MG HALF TABLET
12.5000 mg | ORAL_TABLET | Freq: Two times a day (BID) | ORAL | Status: DC
  Administered 2017-01-28 – 2017-02-03 (×13): 12.5 mg via ORAL
  Filled 2017-01-27 (×14): qty 1

## 2017-01-27 MED ORDER — HALOPERIDOL 0.5 MG PO TABS
0.5000 mg | ORAL_TABLET | Freq: Two times a day (BID) | ORAL | Status: DC
  Administered 2017-01-28 – 2017-01-29 (×3): 0.5 mg via ORAL
  Filled 2017-01-27 (×8): qty 1

## 2017-01-27 MED ORDER — LORAZEPAM 2 MG/ML IJ SOLN
1.0000 mg | INTRAMUSCULAR | Status: DC | PRN
  Administered 2017-01-27 – 2017-01-29 (×7): 1 mg via INTRAVENOUS
  Filled 2017-01-27 (×7): qty 1

## 2017-01-27 MED ORDER — ACETAMINOPHEN 650 MG RE SUPP: 650.0000 mg | Freq: Four times a day (QID) | RECTAL | Status: DC | PRN

## 2017-01-27 MED ORDER — ENOXAPARIN SODIUM 40 MG/0.4ML ~~LOC~~ SOLN
40.0000 mg | Freq: Once | SUBCUTANEOUS | Status: AC
  Administered 2017-01-28: 40 mg via SUBCUTANEOUS
  Filled 2017-01-27: qty 0.4

## 2017-01-27 MED ORDER — DEXTROSE 5 % IV SOLN
1.0000 g | Freq: Once | INTRAVENOUS | Status: AC
  Administered 2017-01-27: 1 g via INTRAVENOUS
  Filled 2017-01-27: qty 10

## 2017-01-27 MED ORDER — INSULIN ASPART 100 UNIT/ML ~~LOC~~ SOLN
0.0000 [IU] | Freq: Three times a day (TID) | SUBCUTANEOUS | Status: DC
  Administered 2017-01-27: 3 [IU] via SUBCUTANEOUS
  Administered 2017-02-01: 2 [IU] via SUBCUTANEOUS
  Administered 2017-02-01 – 2017-02-03 (×5): 3 [IU] via SUBCUTANEOUS
  Administered 2017-02-03: 2 [IU] via SUBCUTANEOUS
  Filled 2017-01-27 (×4): qty 1

## 2017-01-27 MED ORDER — QUETIAPINE FUMARATE 50 MG PO TABS
100.0000 mg | ORAL_TABLET | Freq: Every day | ORAL | Status: DC
  Administered 2017-01-27 – 2017-02-02 (×7): 100 mg via ORAL
  Filled 2017-01-27: qty 1
  Filled 2017-01-27 (×3): qty 2
  Filled 2017-01-27: qty 1
  Filled 2017-01-27 (×2): qty 2

## 2017-01-27 MED ORDER — LEVOTHYROXINE SODIUM 112 MCG PO TABS
112.0000 ug | ORAL_TABLET | Freq: Every day | ORAL | Status: DC
  Administered 2017-01-28 – 2017-02-03 (×6): 112 ug via ORAL
  Filled 2017-01-27 (×7): qty 1

## 2017-01-27 MED ORDER — SODIUM CHLORIDE 0.9 % IV SOLN
INTRAVENOUS | Status: DC
  Administered 2017-01-27 – 2017-01-30 (×5): via INTRAVENOUS

## 2017-01-27 MED ORDER — LORAZEPAM 1 MG PO TABS
1.0000 mg | ORAL_TABLET | ORAL | Status: DC | PRN
Start: 2017-01-27 — End: 2017-01-27
  Administered 2017-01-27: 1 mg via ORAL
  Filled 2017-01-27 (×2): qty 1

## 2017-01-27 NOTE — ED Provider Notes (Signed)
  Face-to-face evaluation   History: He presents for evaluation of multiple falls, and worsening behavioral issues at his facility.  Physical exam: Elderly man, alert, cooperative.  Right periorbital subacute contusion.  No significant conjunctival abnormality.  Abdomen soft nontender.  Chest nontender to palpation.  No dysarthria.  Medical screening examination/treatment/procedure(s) were conducted as a shared visit with non-physician practitioner(s) and myself.  I personally evaluated the patient during the encounter    Daleen Bo, MD 01/30/17 1037

## 2017-01-27 NOTE — ED Notes (Signed)
Bed: HK32 Expected date:  Expected time:  Means of arrival:  Comments: EMS- 89yoM, fall/unsure about complaint

## 2017-01-27 NOTE — ED Triage Notes (Signed)
Pt was sent from facility for fall from bed at 6am. Pt placed himself back in bed. Hospice RN noticed pt was agitated while sitting in wheelchair in lobby of facility. Pt is A&O x 3 and denies all pain at this time. Pt presents with old ecchymosis to right eye from fall and staples to anterior head. Pt has re-injured  skin tear to right hand.

## 2017-01-27 NOTE — ED Provider Notes (Signed)
[facility] DEPT Provider Note   CSN: [id number] Arrival date & time: 01/27/17  1244     History   Chief Complaint Chief Complaint  Patient presents with  . Laceration    right palm skin tear    HPI Joe [name] is a 83 y.o. male.  HPI   83 year old male with history of dementia, diabetes, atrial fibrillation not on anticoagulant brought here via ambulance from a nursing facility for evaluation of a fall. Patient was seen in the ER 2 days ago when he was found on the floor at his nursing home with a large laceration to the top of his head and abrasions to the face and arm. The appropriate labs and imaging was obtained, laceration on scalp was surgically stable. He also has several small skin tears in his extremities that doesn't need repair. Imaging studies revealed no intracranial injury, no maxillofacial fracture or cervical spinal fracture or misalignment. He was discharge back to his facility. According to nursing note, patient may have fallen again today. I did specifically called his facility at [facility] and spoke with his nurse. According to his nurse, for the past week patient has become more agitated, more hostile as well as having multiple unwitnessed falls. Patient has fell twice within the past 10 hours. Both were and witnessed and patient was found on the floor. He normally uses a walker to walk. His moods has changed from his baseline. No report of cough, report any change in medication. He is under hospice care. History is limited due to underlying dementia.  Past Medical History:  Diagnosis Date  . Anxiety   . Atrial fibrillation with RVR ([place]) 08/28/2014   [name] 08/28/2014  . BPH (benign prostatic hypertrophy)    [name] 08/28/2014  . Dementia   . Diabetes mellitus without complication ([place])   . Hypertension    [name] 08/28/2014  . Hypothyroidism    [name] 08/28/2014  . Insomnia   . Thyroid disease     Patient Active Problem List   Diagnosis Date Noted  .  Adjustment disorder with mixed anxiety and depressed mood 11/13/2014  . Cognitive deficits 11/13/2014  . Syncope and collapse 11/10/2014  . Orthostatic hypotension 11/10/2014  . Hypothyroidism 08/28/2014  . Essential hypertension 08/28/2014  . Atrial fibrillation, new onset ([place]) 08/28/2014  . Dizziness 08/28/2014  . BPH (benign prostatic hypertrophy) 08/28/2014  . Diabetes mellitus with no complication ([place]) 40/98/1191  . Risky sexual behavior 08/28/2014  . A-fib ([place]) 08/28/2014  . Hypothyroid 09/10/2013  . BPH (benign prostatic hyperplasia) 09/10/2013    Past Surgical History:  Procedure Laterality Date  . PARS PLANA VITRECTOMY Left 03/30/2013   Procedure: PARS PLANA VITRECTOMY 25 GAUGE FOR ENDOPHTHALMITIS;  Surgeon: Hurman Horn, MD;  Location: [facility];  Service: Ophthalmology;  Laterality: Left;  LOCAL RETROBULBAR,,  USE MARCAINE 0.5%       Home Medications    Prior to Admission medications   Medication Sig Start Date End Date Taking? Authorizing Provider  bacitracin ointment Apply 1 application topically 2 (two) times daily. To scalp laceration 12/06/16   Julianne Rice, MD  ketoconazole (NIZORAL) 2 % shampoo Apply 1 application topically 3 (three) times a week.    [provider]  levothyroxine (SYNTHROID, LEVOTHROID) 112 MCG tablet Take 112 mcg by mouth daily before breakfast.    [provider]  LORazepam (ATIVAN) 0.5 MG tablet Take 5 mg by mouth daily. May take 1 mg q6hprn for anxiety 08/24/16   [provider]  metoprolol tartrate (LOPRESSOR) 25 MG tablet Take 12.5 mg by mouth daily.     [provider]  neomycin-bacitracin-polymyxin (NEOSPORIN) [phone] ointment Apply 1 application topically 2 (two) times daily. To right ear    [provider]  QUEtiapine (SEROQUEL) 50 MG tablet Take 50 mg by mouth 2 (two) times daily.     [provider]  tamsulosin (FLOMAX) 0.4 MG CAPS capsule Take 0.4 mg by mouth daily.     [provider]  TRADJENTA 5 MG TABS tablet Take 5 mg by mouth daily. 08/26/16   [provider]  Vitamin D, Ergocalciferol, (DRISDOL) 50000 units CAPS capsule Take 50,000 Units by mouth every 7 (seven) days.    [provider]    Family History Family History  Problem Relation Age of Onset  . Hyperlipidemia Sister     Social History Social History  Substance Use Topics  . Smoking status: Never Smoker  . Smokeless tobacco: Never Used  . Alcohol use No     Allergies   No known allergies   Review of Systems Review of Systems  Unable to perform ROS: Dementia     Physical Exam Updated Vital Signs BP (!) 111/49 (BP Location: Right Arm)   Pulse 97   Temp (!) 97.5 F (36.4 C) (Oral)   Resp 18   SpO2 98%   Physical Exam  Constitutional:  Elderly male appears disheveled, laying in bed in no acute discomfort.  HENT:  Frontal scalp laceration with surgical staple in place and surrounding scab noted no active bleeding.  Eyes:  Right periorbital ecchymosis noted  Neck:  Cervical spine nontender to palpation.  Cardiovascular: Normal rate and regular rhythm.   Pulmonary/Chest: Effort normal and breath sounds normal.  Abdominal: Soft. He exhibits no distension. There is no tenderness.  Neurological: GCS eye subscore is 4. GCS verbal subscore is 5. GCS motor subscore is 5.  Skin:  Skin tears noted to palmar aspect of both hands not actively bleeding. Small skin abrasion noted to the posterior aspect of bilateral elbow and bilateral knee without any deformity.  Nursing note and vitals reviewed.    ED Treatments / Results  Labs (all labs ordered are listed, but only abnormal results are displayed) Labs Reviewed  URINALYSIS, ROUTINE W REFLEX MICROSCOPIC - Abnormal; Notable for the following:       Result Value   APPearance TURBID (*)    Protein, ur 100 (*)    Nitrite POSITIVE (*)    Leukocytes, UA LARGE (*)    Bacteria, UA MANY (*)    All  other components within normal limits  CBC WITH DIFFERENTIAL/PLATELET - Abnormal; Notable for the following:    RBC 3.13 (*)    Hemoglobin 9.8 (*)    HCT 28.7 (*)    All other components within normal limits  I-STAT CHEM 8, ED - Abnormal; Notable for the following:    BUN 25 (*)    Glucose, Bld 150 (*)    Calcium, Ion 1.03 (*)    Hemoglobin 9.5 (*)    HCT 28.0 (*)    All other components within normal limits    EKG  EKG Interpretation  Date/Time:  Friday January 27 2017 14:01:18 EDT Ventricular Rate:  102 PR Interval:    QRS Duration: 114 QT Interval:  383 QTC Calculation: 499 R Axis:   97 Text Interpretation:  Atrial fibrillation Ventricular premature complex Incomplete right bundle branch block Low voltage, extremity leads Borderline prolonged QT interval since  last tracing no significant change Confirmed by Daleen Bo [phone]) on 01/27/2017 2:40:25 PM       Radiology No results found.  Procedures Procedures (including critical care time)  Medications Ordered in ED Medications  cefTRIAXone (ROCEPHIN) 1 g in dextrose 5 % 50 mL IVPB (1 g Intravenous New Bag/Given 01/27/17 1447)     Initial Impression / Assessment and Plan / ED Course  I have reviewed the triage vital signs and the nursing notes.  Pertinent labs & imaging results that were available during my care of the patient were reviewed by me and considered in my medical decision making (see chart for details).     BP 103/78 (BP Location: Left Arm)   Pulse 95   Temp (!) 97.5 F (36.4 C) (Oral)   Resp 15   SpO2 98%    Final Clinical Impressions(s) / ED Diagnoses   Final diagnoses:  Recurrent falls  Delirium  Acute lower UTI    New Prescriptions New Prescriptions   No medications on file   1:38 PM This is a patient with history of dementia, currently under hospice care here with multiple recurrent falls and associated skin laceration skin tear because of it. Was seen in the ER 2 days ago for  his falls, sent here again today since due to persistent change in his demeanor, which includes being more aggressive and less cooperative. History is limited, will check UA, EKG, basic labs. Skin tear to right hand will be dressed with bacitracin and nonocclusive dressing.  Care discussed with Dr. Eulis Foster.   3:41 PM Urine shows signs of urinary tract infection. Urine culture sent. Patient started on Rocephin. Labs are otherwise at baseline. EKG shows atrial fibrillation, unchanged from prior. It is rate controlled. Appreciate consultation from hospice, Dr. Wyline Copas who agrees to see patient in the ER and will admit for further management.   Domenic Moras, PA-C 01/27/17 1542    Daleen Bo, MD 01/30/17 1037

## 2017-01-27 NOTE — H&P (Signed)
History and Physical    Joe [name] Joe [name] DOB: Oct 29, 1924 DOA: 01/27/2017  PCP: Anda Kraft, MD  Patient coming from: skilled nursing facility  Chief Complaint: confusion  HPI: Joe [name] is a 83 y.o. male with medical history significant of advanced dementia, atrial fibrillation, BPH,diabetes, hypertension who presents from facility following multiple falls resulting in superficial injury. Of note, patient has advanced dementia and is a very poor historian. Family is not present in room, case was discussed with nurse at facility was able to provide some additional information. Patient was seen and evaluated in emergency department following recent fall with unremarkable head CT.on the day of hospital mission, patient was noted to be increasingly confused and agitated. Patient again had continued falls. Patient was simply brought to the emergency department for further workup  ED Course: in the emergency department, patient was noted have a urinalysis suggestive of UTI. Patient was noted to be mildly hypothermic. No leukocytosis was identified. Patient was started on empiric Rocephin. Hospital service consulted for consideration for admission. Of note, patient is currently active with hospice care services.  Review of Systems:  Review of Systems  Unable to perform ROS: Dementia    Past Medical History:  Diagnosis Date  . Anxiety   . Atrial fibrillation with RVR ([place]) 08/28/2014   [name] 08/28/2014  . BPH (benign prostatic hypertrophy)    [name] 08/28/2014  . Dementia   . Diabetes mellitus without complication ([place])   . Hypertension    [name] 08/28/2014  . Hypothyroidism    [name] 08/28/2014  . Insomnia   . Thyroid disease     Past Surgical History:  Procedure Laterality Date  . PARS PLANA VITRECTOMY Left 03/30/2013   Procedure: PARS PLANA VITRECTOMY 25 GAUGE FOR ENDOPHTHALMITIS;  Surgeon: Hurman Horn, MD;  Location: [facility];  Service: Ophthalmology;   Laterality: Left;  LOCAL RETROBULBAR,,  USE MARCAINE 0.5%     reports that he has never smoked. He has never used smokeless tobacco. He reports that he does not drink alcohol or use drugs.  Allergies  Allergen Reactions  . No Known Allergies     Family History  Problem Relation Age of Onset  . Hyperlipidemia Sister     Prior to Admission medications   Medication Sig Start Date End Date Taking? Authorizing Provider  acetaminophen (TYLENOL) 325 MG tablet Take 650 mg by mouth every 6 (six) hours as needed for mild pain.   Yes [provider]  haloperidol (HALDOL) 0.5 MG tablet Take 0.5 mg by mouth 2 (two) times daily.   Yes [provider]  ketoconazole (NIZORAL) 2 % shampoo Apply 1 application topically 3 (three) times a week.   Yes [provider]  levofloxacin (LEVAQUIN) 500 MG tablet Take 500 mg by mouth daily.   Yes [provider]  levothyroxine (SYNTHROID, LEVOTHROID) 112 MCG tablet Take 112 mcg by mouth daily before breakfast.   Yes [provider]  LORazepam (ATIVAN) 0.5 MG tablet Take 5 mg by mouth daily. May take 1 mg q4hprn for agitation 08/24/16  Yes [provider]  metoprolol tartrate (LOPRESSOR) 25 MG tablet Take 12.5 mg by mouth daily.    Yes [provider]  QUEtiapine (SEROQUEL) 100 MG tablet Take 100 mg by mouth at bedtime.    Yes [provider]  tamsulosin (FLOMAX) 0.4 MG CAPS capsule Take 0.4 mg by mouth daily.   Yes [provider]  TRADJENTA 5 MG TABS tablet Take 5 mg by  mouth daily. 08/26/16  Yes [provider]  Vitamin D, Ergocalciferol, (DRISDOL) 50000 units CAPS capsule Take 50,000 Units by mouth every 7 (seven) days.   Yes [provider]    Physical Exam: Vitals:   01/27/17 1258 01/27/17 1301 01/27/17 1429 01/27/17 1545  BP: (!) 111/49 (!) 111/49 103/78 104/71  Pulse: 100 97 95 94  Resp: 16 18 15 13   Temp: (!) 97.5 F (36.4 C) (!) 97.5 F (36.4 C)      TempSrc: Oral Oral    SpO2: 98% 98% 98% 98%    Constitutional: NAD, calm, comfortable Vitals:   01/27/17 1258 01/27/17 1301 01/27/17 1429 01/27/17 1545  BP: (!) 111/49 (!) 111/49 103/78 104/71  Pulse: 100 97 95 94  Resp: 16 18 15 13   Temp: (!) 97.5 F (36.4 C) (!) 97.5 F (36.4 C)    TempSrc: Oral Oral    SpO2: 98% 98% 98% 98%   Eyes: PERRL, lids and conjunctivae normal ENMT: Mucous membranes are moist. Posterior pharynx clear of any exudate or lesions.Normal dentition.  Neck: normal, supple, no masses, no thyromegaly Respiratory: clear to auscultation bilaterally, no wheezing, no crackles. Normal respiratory effort. No accessory muscle use.  Cardiovascular: Regular rate and rhythm, no murmurs / rubs / gallops. No extremity edema. 2+ pedal pulses. No carotid bruits.  Abdomen: no tenderness, no masses palpated. No hepatosplenomegaly. Bowel sounds positive.  Musculoskeletal: no clubbing / cyanosis. No joint deformity upper and lower extremities. Good ROM, no contractures. Normal muscle tone.  Skin: no rashes, lesions, ulcers. No induration Neurologic: CN 2-12 grossly intact. Sensation intact, DTR normal. Strength 5/5 in all 4.  Psychiatric: unable to obtain secondary to advanced dementia   Labs on Admission: I have personally reviewed following labs and imaging studies  CBC:  Recent Labs Lab 01/27/17 1337 01/27/17 1440  WBC  --  9.1  NEUTROABS  --  7.0  HGB 9.5* 9.8*  HCT 28.0* 28.7*  MCV  --  91.7  PLT  --  419   Basic Metabolic Panel:  Recent Labs Lab 01/27/17 1337  NA 137  K 3.9  CL 102  GLUCOSE 150*  BUN 25*  CREATININE 1.00   GFR: CrCl cannot be calculated (Unknown ideal weight.). Liver Function Tests: No results for input(s): AST, ALT, ALKPHOS, BILITOT, PROT, ALBUMIN in the last 168 hours. No results for input(s): LIPASE, AMYLASE in the last 168 hours. No results for input(s): AMMONIA in the last 168 hours. Coagulation Profile: No results for  input(s): INR, PROTIME in the last 168 hours. Cardiac Enzymes: No results for input(s): CKTOTAL, CKMB, CKMBINDEX, TROPONINI in the last 168 hours. BNP (last 3 results) No results for input(s): PROBNP in the last 8760 hours. HbA1C: No results for input(s): HGBA1C in the last 72 hours. CBG: No results for input(s): GLUCAP in the last 168 hours. Lipid Profile: No results for input(s): CHOL, HDL, LDLCALC, TRIG, CHOLHDL, LDLDIRECT in the last 72 hours. Thyroid Function Tests: No results for input(s): TSH, T4TOTAL, FREET4, T3FREE, THYROIDAB in the last 72 hours. Anemia Panel: No results for input(s): VITAMINB12, FOLATE, FERRITIN, TIBC, IRON, RETICCTPCT in the last 72 hours. Urine analysis:    Component Value Date/Time   COLORURINE YELLOW 01/27/2017 1325   APPEARANCEUR TURBID (A) 01/27/2017 1325   LABSPEC 1.017 01/27/2017 1325   PHURINE 6.0 01/27/2017 1325   GLUCOSEU NEGATIVE 01/27/2017 1325   HGBUR NEGATIVE 01/27/2017 1325   BILIRUBINUR NEGATIVE 01/27/2017 1325   KETONESUR NEGATIVE 01/27/2017 1325   PROTEINUR  100 (A) 01/27/2017 1325   UROBILINOGEN 0.2 08/28/2014 1144   NITRITE POSITIVE (A) 01/27/2017 1325   LEUKOCYTESUR LARGE (A) 01/27/2017 1325   Sepsis Labs: !!!!!!!!!!!!!!!!!!!!!!!!!!!!!!!!!!!!!!!!!!!! @LABRCNTIP (procalcitonin:4,lacticidven:4) )No results found for this or any previous visit (from the past 240 hour(s)).   Radiological Exams on Admission: No results found.  EKG: Independently reviewed. Atrial fibrillation, QTC 499  Assessment/Plan Principal Problem:   Sepsis secondary to UTI [facility]) Active Problems:   Essential hypertension   Diabetes mellitus with no complication ([facility])   A-fib ([facility])   Syncope and collapse   Orthostatic hypotension   1. Sepsis secondary to UTI 1. Patient presents with increased confusion with mild hypothermia, urinalysis suggestive of UTI 2. We'll check lactic acid 3. Continue empiric Rocephin 4. Follow culture  results 2. Hypertension 1. Low pressure currently stable, albeit soft 2. We'll decrease dose of metoprolol from 25 mg to 12.5 mg twice a day with hold parameters 3. Diabetes 1. Stable at present 2. Continue patient on sliding scale insulin as needed 4. chronic atrial fibrillation 1. Presently rate controlled. 2. Continue metoprolol at lower dose as per above 3. Given multiple recent falls, will not anticoagulate given fall risk.  5. Recent falls 1. We'll consult PT/OT 6. End-of-life 1. Patient noted to have prior DO NOT RESUSCITATE wishes. Patient does not have a DO NOT RESUSCITATE form with him. Unable to contact family members to confirm patient's CODE STATUS. Instead, have discussed case with patient's nurse at facility who states patient does not have DO NOT RESUSCITATE wishes there.  2. We'll keep patient for a CODE STATUS for now, attempt to contact family to address CODE STATUS.  DVT prophylaxis: Heparin subq  Code Status: Full Family Communication: Pt in room, family not present  Disposition Plan: Uncertain at this tiem  Consults called:  Admission status: Inpatient, as will require more than 2 midnight stay to treat sepsis   Tonyia Marschall, Orpah Melter MD [facility] Pager [phone]  If 7PM-7AM, please contact night-coverage [email or web address] Password [email or web address]  01/27/2017, 4:33 PM

## 2017-01-28 LAB — COMPREHENSIVE METABOLIC PANEL
ALBUMIN: 2.1 g/dL — AB (ref 3.5–5.0)
ALK PHOS: 90 U/L (ref 38–126)
ALT: 10 U/L — ABNORMAL LOW (ref 17–63)
AST: 15 U/L (ref 15–41)
Anion gap: 7 (ref 5–15)
BILIRUBIN TOTAL: 0.5 mg/dL (ref 0.3–1.2)
BUN: 22 mg/dL — AB (ref 6–20)
CALCIUM: 7.6 mg/dL — AB (ref 8.9–10.3)
CO2: 24 mmol/L (ref 22–32)
Chloride: 111 mmol/L (ref 101–111)
Creatinine, Ser: 1.08 mg/dL (ref 0.61–1.24)
GFR calc Af Amer: >60 mL/min (ref >60)
GFR calc non Af Amer: 59 mL/min — ABNORMAL LOW (ref >60)
GLUCOSE: 81 mg/dL (ref 65–99)
Potassium: 3.3 mmol/L — ABNORMAL LOW (ref 3.5–5.1)
SODIUM: 142 mmol/L (ref 135–145)
TOTAL PROTEIN: 5 g/dL — AB (ref 6.5–8.1)

## 2017-01-28 LAB — MRSA PCR SCREENING: MRSA BY PCR: POSITIVE — AB

## 2017-01-28 LAB — GLUCOSE, CAPILLARY
GLUCOSE-CAPILLARY: 112 mg/dL — AB (ref 65–99)
Glucose-Capillary: 83 mg/dL (ref 65–99)
Glucose-Capillary: 90 mg/dL (ref 65–99)
Glucose-Capillary: 148 mg/dL — ABNORMAL HIGH (ref 65–99)

## 2017-01-28 LAB — CBC
HEMATOCRIT: 24.7 % — AB (ref 39.0–52.0)
Hemoglobin: 8.4 g/dL — ABNORMAL LOW (ref 13.0–17.0)
MCH: 31.2 pg (ref 26.0–34.0)
MCHC: 34 g/dL (ref 30.0–36.0)
MCV: 91.8 fL (ref 78.0–100.0)
Platelets: 255 10*3/uL (ref 150–400)
RBC: 2.69 MIL/uL — ABNORMAL LOW (ref 4.22–5.81)
RDW: 15.3 % (ref 11.5–15.5)
WBC: 7.8 10*3/uL (ref 4.0–10.5)

## 2017-01-28 MED ORDER — CHLORHEXIDINE GLUCONATE CLOTH 2 % EX PADS
6.0000 | MEDICATED_PAD | Freq: Every day | CUTANEOUS | Status: AC
  Administered 2017-01-29 – 2017-01-31 (×2): 6 via TOPICAL

## 2017-01-28 MED ORDER — POTASSIUM CHLORIDE CRYS ER 20 MEQ PO TBCR
40.0000 meq | EXTENDED_RELEASE_TABLET | Freq: Once | ORAL | Status: AC
  Administered 2017-01-28: 40 meq via ORAL
  Filled 2017-01-28: qty 2

## 2017-01-28 MED ORDER — MUPIROCIN 2 % EX OINT
1.0000 "application " | TOPICAL_OINTMENT | Freq: Two times a day (BID) | CUTANEOUS | Status: AC
  Administered 2017-01-28 – 2017-02-01 (×10): 1 via NASAL
  Filled 2017-01-28 (×3): qty 22

## 2017-01-28 NOTE — Progress Notes (Signed)
Pt was restless throughout the day. Medicated as needed. Family did visit today. They were spoke with MD caring for the patient. They were able to get him to eat and drink. Condom cath on. Cont with plan of care

## 2017-01-28 NOTE — Progress Notes (Signed)
PT Cancellation Note  Patient Details Name: Joe [name] MRN: [id number] DOB: [birth year]   Cancelled Treatment:    Reason Eval/Treat Not Completed: Medical issues which prohibited therapy, per RN, patient is too agitated. Will check back another day.   Claretha Cooper 01/28/2017, 11:18 AM Tresa Endo PT [phone]

## 2017-01-28 NOTE — Progress Notes (Signed)
Patient ID: Joe [name], male   DOB: 1924-12-22, 83 y.o.   MRN: [id number]  PROGRESS NOTE    CHIN WACHTER  YKZ:[id number] DOB: 23-Dec-1924 DOA: 01/27/2017  PCP: Anda Kraft, MD   Brief Narrative:  83 year old male with history of advanced dementia, atrial fibrillation, BPH, hypertension who presented to ED from the facility because of multiple falls. On admission he was slightly hypothermic with temperature of 97.69F. His blood work was significant for hemoglobin of 9.5, glucose 150. Urinalysis was suggestive of UTI and he was started on empiric Rocephin while awaiting culture results.   Assessment & Plan:   Principal Problem:   Sepsis secondary to UTI ([place]) - Sepsis criteria met on admission with hypothermia, tachycardia, hypotension - Lactic acid is WNL - UA showed large leukocytes  - Blood cultures and urine culture ordered - Started rocephin   Active Problems:   Essential hypertension - Continue metoprolol    Diabetes mellitus with no complication (HCC) - Continue sliding scale insulin    A-fib, chronic (HCC) - CHA2DS2-VASc Score is 3 - Rate controlled with apixaban - Not on AC due to advanced dementia and risk of fall    Dementia with behavioral disturbance - May use Ativan and/or Haldol for agitation or restlessness - Continue Seroquel 100 mg at bedtime   DVT prophylaxis: Lovenox subcutaneous Code Status: full code  Family Communication: No family at the bedside Disposition Plan: Needs PT evaluation   Consultants:   PT  Procedures:   None   Antimicrobials:   Rocephin 01/27/2017 -->    Subjective: No overnight events.   Objective: Vitals:   01/27/17 2227 01/27/17 2345 01/28/17 0300 01/28/17 0658  BP: 114/71 (!) 131/59  106/70  Pulse: (!) 101 (!) 118  (!) 103  Resp: _0 Temp:  98 F (36.7 C)  99.9 F (37.7 C)  TempSrc:  Axillary  Axillary  SpO2: 100% 97%  99%  Weight:   60 kg (132 lb 4.4 oz)     Intake/Output Summary  (Last 24 hours) at 01/28/17 9390 Last data filed at 01/27/17 1517  Gross per 24 hour  Intake               50 ml  Output                0 ml  Net               50 ml   Filed Weights   01/28/17 0300  Weight: 60 kg (132 lb 4.4 oz)    Examination:  General exam: Appears restless, no distress   Respiratory system: Clear to auscultation. Respiratory effort normal. Cardiovascular system: S1 & S2 heard, Rate controlled  Gastrointestinal system: Abdomen is nondistended, soft and nontender. No organomegaly or masses felt. Normal bowel sounds heard. Central nervous system: Alert and oriented. No focal neurological deficits. Extremities: Symmetric 5 x 5 power. Skin: No rashes, lesions or ulcers Psychiatry: restless, not agitated   Data Reviewed: I have personally reviewed following labs and imaging studies  CBC:  Recent Labs Lab 01/27/17 1337 01/27/17 1440 01/28/17 0422  WBC  --  9.1 7.8  NEUTROABS  --  7.0  --   HGB 9.5* 9.8* 8.4*  HCT 28.0* 28.7* 24.7*  MCV  --  91.7 91.8  PLT  --  251 300   Basic Metabolic Panel:  Recent Labs Lab 01/27/17 1337 01/28/17 0422  NA 137 142  K 3.9 3.3*  CL 102  111  CO2  --  24  GLUCOSE 150* 81  BUN 25* 22*  CREATININE 1.00 1.08  CALCIUM  --  7.6*   GFR: Estimated Creatinine Clearance: 39.4 mL/min (by C-G formula based on SCr of 1.08 mg/dL). Liver Function Tests:  Recent Labs Lab 01/28/17 0422  AST 15  ALT 10*  ALKPHOS 90  BILITOT 0.5  PROT 5.0*  ALBUMIN 2.1*   No results for input(s): LIPASE, AMYLASE in the last 168 hours. No results for input(s): AMMONIA in the last 168 hours. Coagulation Profile: No results for input(s): INR, PROTIME in the last 168 hours. Cardiac Enzymes: No results for input(s): CKTOTAL, CKMB, CKMBINDEX, TROPONINI in the last 168 hours. BNP (last 3 results) No results for input(s): PROBNP in the last 8760 hours. HbA1C: No results for input(s): HGBA1C in the last 72 hours. CBG:  Recent Labs Lab  01/27/17 1805 01/27/17 2149  GLUCAP 169* 150*   Lipid Profile: No results for input(s): CHOL, HDL, LDLCALC, TRIG, CHOLHDL, LDLDIRECT in the last 72 hours. Thyroid Function Tests: No results for input(s): TSH, T4TOTAL, FREET4, T3FREE, THYROIDAB in the last 72 hours. Anemia Panel: No results for input(s): VITAMINB12, FOLATE, FERRITIN, TIBC, IRON, RETICCTPCT in the last 72 hours. Urine analysis:    Component Value Date/Time   COLORURINE YELLOW 01/27/2017 1325   APPEARANCEUR TURBID (A) 01/27/2017 1325   LABSPEC 1.017 01/27/2017 1325   PHURINE 6.0 01/27/2017 1325   GLUCOSEU NEGATIVE 01/27/2017 1325   HGBUR NEGATIVE 01/27/2017 1325   BILIRUBINUR NEGATIVE 01/27/2017 1325   KETONESUR NEGATIVE 01/27/2017 1325   PROTEINUR 100 (A) 01/27/2017 1325   UROBILINOGEN 0.2 08/28/2014 1144   NITRITE POSITIVE (A) 01/27/2017 1325   LEUKOCYTESUR LARGE (A) 01/27/2017 1325   Sepsis Labs: _0 (procalcitonin:4,lacticidven:4)   ) Recent Results (from the past 240 hour(s))  Culture, blood (x 2)     Status: None (Preliminary result)   Collection Time: 01/27/17  9:51 PM  Result Value Ref Range Status   Specimen Description   Final    BLOOD LEFT FOREARM Performed at [facility], [place] [address] [place], [place] [address]    Special Requests   Final    BOTTLES DRAWN AEROBIC AND ANAEROBIC Blood Culture adequate volume   Culture PENDING  Incomplete   Report Status PENDING  Incomplete  MRSA PCR Screening     Status: Abnormal   Collection Time: 01/28/17 12:26 AM  Result Value Ref Range Status   MRSA by PCR POSITIVE (A) NEGATIVE Final    Comment:        The GeneXpert MRSA Assay (FDA approved for NASAL specimens only), is one component of a comprehensive MRSA colonization surveillance program. It is not intended to diagnose MRSA infection nor to guide or monitor treatment for MRSA infections. RESULT CALLED TO, READ BACK BY AND VERIFIED WITH: J SCOTTON AT 0236 ON 09.29.2018 BY  NBROOKS       Radiology Studies: Ct Head Wo Contrast  Result Date: 01/25/2017 CLINICAL DATA:  Post unwitnessed fall with facial hematoma and scalp injury. EXAM: CT HEAD WITHOUT CONTRAST CT MAXILLOFACIAL WITHOUT CONTRAST CT CERVICAL SPINE WITHOUT CONTRAST TECHNIQUE: Multidetector CT imaging of the head, cervical spine, and maxillofacial structures were performed using the standard protocol without intravenous contrast. Multiplanar CT image reconstructions of the cervical spine and maxillofacial structures were also generated. COMPARISON:  Head and cervical spine CT 12/06/2016, additional priors. FINDINGS: CT HEAD FINDINGS Brain: Stable degree of atrophy and chronic small vessel ischemia. No intracranial hemorrhage, mass  effect, or midline shift. No hydrocephalus. The basilar cisterns are patent. No evidence of territorial infarct or acute ischemia. No extra-axial or intracranial fluid collection. Vascular: Atherosclerosis of skullbase vasculature without hyperdense vessel or abnormal calcification. Skull: No fracture or focal lesion. Other: Right frontal skin staples with hematoma. Scattered air in the scalp about the vertex. CT MAXILLOFACIAL FINDINGS Osseous: Nasal bone, zygomatic arches and mandibles are intact. The temporomandibular joints are congruent with mild degenerative change. There are multiple missing teeth with poor dentition and dental caries. Mild motion artifact limits assessment. Orbits: Both orbits and globes are intact. No orbital fracture. Bilateral cataract resection. Sinuses: Mild mucous retention cyst in the left maxillary sinus, otherwise clear. Soft tissues: Right periorbital soft tissue hematoma. No radiopaque foreign body. CT CERVICAL SPINE FINDINGS Alignment: Unchanged from prior exam. Mild degenerative anterolisthesis of C6 on C7 is stable. Reversal of normal cervical lordosis, unchanged. Skull base and vertebrae: No acute fracture. Remote C7 and T2 compression fractures,  unchanged. Skullbase and dens are intact. Soft tissues and spinal canal: No prevertebral fluid or swelling. No visible canal hematoma. Disc levels: Diffuse disc space narrowing and endplate spurring, most prominent at C3-C4. Multilevel facet arthropathy. Degenerative changes are stable from prior. Upper chest: Negative. Other: Carotid calcifications. IMPRESSION: 1. Scalp injury with skin staples in place and hematoma. No acute intracranial abnormality. No skull fracture. Stable atrophy and chronic small vessel ischemia. 2. Right periorbital soft tissue edema. No acute facial bone fracture. 3. No acute fracture or subluxation of the cervical spine. Degenerative changes are stable from prior. 4. Carotid and skullbase atherosclerosis. Electronically Signed   By: Jeb Levering M.D.   On: 01/25/2017 05:50   Ct Cervical Spine Wo Contrast  Result Date: 01/25/2017 CLINICAL DATA:  Post unwitnessed fall with facial hematoma and scalp injury. EXAM: CT HEAD WITHOUT CONTRAST CT MAXILLOFACIAL WITHOUT CONTRAST CT CERVICAL SPINE WITHOUT CONTRAST TECHNIQUE: Multidetector CT imaging of the head, cervical spine, and maxillofacial structures were performed using the standard protocol without intravenous contrast. Multiplanar CT image reconstructions of the cervical spine and maxillofacial structures were also generated. COMPARISON:  Head and cervical spine CT 12/06/2016, additional priors. FINDINGS: CT HEAD FINDINGS Brain: Stable degree of atrophy and chronic small vessel ischemia. No intracranial hemorrhage, mass effect, or midline shift. No hydrocephalus. The basilar cisterns are patent. No evidence of territorial infarct or acute ischemia. No extra-axial or intracranial fluid collection. Vascular: Atherosclerosis of skullbase vasculature without hyperdense vessel or abnormal calcification. Skull: No fracture or focal lesion. Other: Right frontal skin staples with hematoma. Scattered air in the scalp about the vertex. CT  MAXILLOFACIAL FINDINGS Osseous: Nasal bone, zygomatic arches and mandibles are intact. The temporomandibular joints are congruent with mild degenerative change. There are multiple missing teeth with poor dentition and dental caries. Mild motion artifact limits assessment. Orbits: Both orbits and globes are intact. No orbital fracture. Bilateral cataract resection. Sinuses: Mild mucous retention cyst in the left maxillary sinus, otherwise clear. Soft tissues: Right periorbital soft tissue hematoma. No radiopaque foreign body. CT CERVICAL SPINE FINDINGS Alignment: Unchanged from prior exam. Mild degenerative anterolisthesis of C6 on C7 is stable. Reversal of normal cervical lordosis, unchanged. Skull base and vertebrae: No acute fracture. Remote C7 and T2 compression fractures, unchanged. Skullbase and dens are intact. Soft tissues and spinal canal: No prevertebral fluid or swelling. No visible canal hematoma. Disc levels: Diffuse disc space narrowing and endplate spurring, most prominent at C3-C4. Multilevel facet arthropathy. Degenerative changes are stable from prior. Upper chest: Negative.  Other: Carotid calcifications. IMPRESSION: 1. Scalp injury with skin staples in place and hematoma. No acute intracranial abnormality. No skull fracture. Stable atrophy and chronic small vessel ischemia. 2. Right periorbital soft tissue edema. No acute facial bone fracture. 3. No acute fracture or subluxation of the cervical spine. Degenerative changes are stable from prior. 4. Carotid and skullbase atherosclerosis. Electronically Signed   By: Jeb Levering M.D.   On: 01/25/2017 05:50   Ct Maxillofacial Wo Contrast  Result Date: 01/25/2017 CLINICAL DATA:  Post unwitnessed fall with facial hematoma and scalp injury. EXAM: CT HEAD WITHOUT CONTRAST CT MAXILLOFACIAL WITHOUT CONTRAST CT CERVICAL SPINE WITHOUT CONTRAST TECHNIQUE: Multidetector CT imaging of the head, cervical spine, and maxillofacial structures were performed  using the standard protocol without intravenous contrast. Multiplanar CT image reconstructions of the cervical spine and maxillofacial structures were also generated. COMPARISON:  Head and cervical spine CT 12/06/2016, additional priors. FINDINGS: CT HEAD FINDINGS Brain: Stable degree of atrophy and chronic small vessel ischemia. No intracranial hemorrhage, mass effect, or midline shift. No hydrocephalus. The basilar cisterns are patent. No evidence of territorial infarct or acute ischemia. No extra-axial or intracranial fluid collection. Vascular: Atherosclerosis of skullbase vasculature without hyperdense vessel or abnormal calcification. Skull: No fracture or focal lesion. Other: Right frontal skin staples with hematoma. Scattered air in the scalp about the vertex. CT MAXILLOFACIAL FINDINGS Osseous: Nasal bone, zygomatic arches and mandibles are intact. The temporomandibular joints are congruent with mild degenerative change. There are multiple missing teeth with poor dentition and dental caries. Mild motion artifact limits assessment. Orbits: Both orbits and globes are intact. No orbital fracture. Bilateral cataract resection. Sinuses: Mild mucous retention cyst in the left maxillary sinus, otherwise clear. Soft tissues: Right periorbital soft tissue hematoma. No radiopaque foreign body. CT CERVICAL SPINE FINDINGS Alignment: Unchanged from prior exam. Mild degenerative anterolisthesis of C6 on C7 is stable. Reversal of normal cervical lordosis, unchanged. Skull base and vertebrae: No acute fracture. Remote C7 and T2 compression fractures, unchanged. Skullbase and dens are intact. Soft tissues and spinal canal: No prevertebral fluid or swelling. No visible canal hematoma. Disc levels: Diffuse disc space narrowing and endplate spurring, most prominent at C3-C4. Multilevel facet arthropathy. Degenerative changes are stable from prior. Upper chest: Negative. Other: Carotid calcifications. IMPRESSION: 1. Scalp injury  with skin staples in place and hematoma. No acute intracranial abnormality. No skull fracture. Stable atrophy and chronic small vessel ischemia. 2. Right periorbital soft tissue edema. No acute facial bone fracture. 3. No acute fracture or subluxation of the cervical spine. Degenerative changes are stable from prior. 4. Carotid and skullbase atherosclerosis. Electronically Signed   By: Jeb Levering M.D.   On: 01/25/2017 05:50        Scheduled Meds: . Chlorhexidine Gluconate Cloth  6 each Topical Q0600  . enoxaparin (LOVENOX) injection  40 mg Subcutaneous Once  . haloperidol  0.5 mg Oral BID  . insulin aspart  0-15 Units Subcutaneous TID WC  . insulin aspart  0-5 Units Subcutaneous QHS  . levothyroxine  112 mcg Oral QAC breakfast  . metoprolol tartrate  12.5 mg Oral BID  . mupirocin ointment  1 application Nasal BID  . QUEtiapine  100 mg Oral QHS   Continuous Infusions: . sodium chloride 75 mL/hr at 01/27/17 2049  . cefTRIAXone (ROCEPHIN)  IV Stopped (01/27/17 2158)     LOS: 1 day    Time spent: 25 minutes  Greater than 50% of the time spent on counseling and coordinating the care.  Leisa Lenz, MD [facility] Pager [phone]  If 7PM-7AM, please contact night-coverage [email or web address] Password [email or web address] 01/28/2017, 8:22 AM

## 2017-01-29 ENCOUNTER — Encounter (HOSPITAL_COMMUNITY): Payer: Self-pay

## 2017-01-29 LAB — BASIC METABOLIC PANEL
Anion gap: 6 (ref 5–15)
BUN: 12 mg/dL (ref 6–20)
CALCIUM: 7.6 mg/dL — AB (ref 8.9–10.3)
CHLORIDE: 111 mmol/L (ref 101–111)
CO2: 24 mmol/L (ref 22–32)
CREATININE: 0.88 mg/dL (ref 0.61–1.24)
GFR calc Af Amer: >60 mL/min (ref >60)
GFR calc non Af Amer: >60 mL/min (ref >60)
GLUCOSE: 111 mg/dL — AB (ref 65–99)
Potassium: 3.5 mmol/L (ref 3.5–5.1)
Sodium: 141 mmol/L (ref 135–145)

## 2017-01-29 LAB — CBC
HCT: 26.6 % — ABNORMAL LOW (ref 39.0–52.0)
Hemoglobin: 9 g/dL — ABNORMAL LOW (ref 13.0–17.0)
MCH: 31.4 pg (ref 26.0–34.0)
MCHC: 33.8 g/dL (ref 30.0–36.0)
MCV: 92.7 fL (ref 78.0–100.0)
PLATELETS: 275 10*3/uL (ref 150–400)
RBC: 2.87 MIL/uL — AB (ref 4.22–5.81)
RDW: 15.5 % (ref 11.5–15.5)
WBC: 8.8 10*3/uL (ref 4.0–10.5)

## 2017-01-29 LAB — GLUCOSE, CAPILLARY
GLUCOSE-CAPILLARY: 87 mg/dL (ref 65–99)
GLUCOSE-CAPILLARY: 190 mg/dL — AB (ref 65–99)
Glucose-Capillary: 89 mg/dL (ref 65–99)
Glucose-Capillary: 111 mg/dL — ABNORMAL HIGH (ref 65–99)

## 2017-01-29 MED ORDER — ENSURE ENLIVE PO LIQD
237.0000 mL | Freq: Three times a day (TID) | ORAL | Status: DC
  Administered 2017-01-29 – 2017-02-03 (×13): 237 mL via ORAL

## 2017-01-29 NOTE — Progress Notes (Addendum)
Patient ID: Joe [name], male   DOB: 04-28-1925, 83 y.o.   MRN: [id number]  PROGRESS NOTE    Joe [name]  WNI:[id number] DOB: 1925-01-28 DOA: 01/27/2017  PCP: Anda Kraft, MD   Brief Narrative:  83 year old male with history of advanced dementia, atrial fibrillation, BPH, hypertension who presented to ED from the facility because of multiple falls. On admission he was slightly hypothermic with temperature of 97.59F. His blood work was significant for hemoglobin of 9.5, glucose 150. Urinalysis was suggestive of UTI and he was started on empiric Rocephin while awaiting culture results.   Assessment & Plan:   Principal Problem:   Sepsis secondary to UTI ([place]) - Sepsis criteria met on admission with hypothermia, tachycardia, hypotension - UA showed large leukocytes  - Lactic acid is WNL - Blood cx negative so far - Urine cx pending - Continue rocephin   Active Problems:   Essential hypertension - Continue metoprolol     Diabetes mellitus with no complication (HCC) - Continue SSI    A-fib, chronic (HCC) - CHA2DS2-VASc Score is 3 - Not on AC due to advanced dementia and risk of fall - Rate controlled with metoprolol     Dementia with behavioral disturbance - May use ativan PRN - Continue Seroquel    DVT prophylaxis: Lovenox subQ Code Status: full code  Family Communication: spoke with family over the phone yesterday 9/29 Disposition Plan: not yet stable for discharge, awaiting culture results    Consultants:   PT  Procedures:   None   Antimicrobials:   Rocephin 01/27/2017 -->    Subjective: Restless overnight.  Objective: Vitals:   01/28/17 0658 01/28/17 1520 01/28/17 2216 01/29/17 0624  BP: 106/70 (!) 144/79 (!) 123/93 116/65  Pulse: (!) 103 (!) 106 (!) 106 100  Resp: '18 18 18 18  '$ Temp: 99.9 F (37.7 C) 98 F (36.7 C) (!) 97.5 F (36.4 C) 98 F (36.7 C)  TempSrc: Axillary Oral Axillary Axillary  SpO2: 99% 100% 97% 96%  Weight:         Intake/Output Summary (Last 24 hours) at 01/29/17 1025 Last data filed at 01/29/17 0900  Gross per 24 hour  Intake           1860.5 ml  Output              700 ml  Net           1160.5 ml   Filed Weights   01/28/17 0300  Weight: 60 kg (132 lb 4.4 oz)    Physical Exam  Constitutional: Appears in no distress.  CVS: RRR, S1/S2 +, Pulmonary: Effort and breath sounds normal, no wheezes, rales.  Abdominal: Soft. BS +,  no distension, tenderness, rebound or guarding.  Musculoskeletal: No edema and no tenderness.  Neuro: demented otherwise non focal Skin: Skin is warm and dry. Multiple bruises on face, lacerations Psychiatric: Normal mood, not restless at this time    Data Reviewed: I have personally reviewed following labs and imaging studies  CBC:  Recent Labs Lab 01/27/17 1337 01/27/17 1440 01/28/17 0422 01/29/17 0526  WBC  --  9.1 7.8 8.8  NEUTROABS  --  7.0  --   --   HGB 9.5* 9.8* 8.4* 9.0*  HCT 28.0* 28.7* 24.7* 26.6*  MCV  --  91.7 91.8 92.7  PLT  --  251 255 381   Basic Metabolic Panel:  Recent Labs Lab 01/27/17 1337 01/28/17 0422 01/29/17 0526  NA 137 142 141  K  3.9 3.3* 3.5  CL 102 111 111  CO2  --  24 24  GLUCOSE 150* 81 111*  BUN 25* 22* 12  CREATININE 1.00 1.08 0.88  CALCIUM  --  7.6* 7.6*   GFR: Estimated Creatinine Clearance: 48.3 mL/min (by C-G formula based on SCr of 0.88 mg/dL). Liver Function Tests:  Recent Labs Lab 01/28/17 0422  AST 15  ALT 10*  ALKPHOS 90  BILITOT 0.5  PROT 5.0*  ALBUMIN 2.1*   No results for input(s): LIPASE, AMYLASE in the last 168 hours. No results for input(s): AMMONIA in the last 168 hours. Coagulation Profile: No results for input(s): INR, PROTIME in the last 168 hours. Cardiac Enzymes: No results for input(s): CKTOTAL, CKMB, CKMBINDEX, TROPONINI in the last 168 hours. BNP (last 3 results) No results for input(s): PROBNP in the last 8760 hours. HbA1C: No results for input(s): HGBA1C in the last  72 hours. CBG:  Recent Labs Lab 01/28/17 1022 01/28/17 1130 01/28/17 1712 01/28/17 2220 01/29/17 0740  GLUCAP 83 90 112* 148* 87   Lipid Profile: No results for input(s): CHOL, HDL, LDLCALC, TRIG, CHOLHDL, LDLDIRECT in the last 72 hours. Thyroid Function Tests: No results for input(s): TSH, T4TOTAL, FREET4, T3FREE, THYROIDAB in the last 72 hours. Anemia Panel: No results for input(s): VITAMINB12, FOLATE, FERRITIN, TIBC, IRON, RETICCTPCT in the last 72 hours. Urine analysis:    Component Value Date/Time   COLORURINE YELLOW 01/27/2017 1325   APPEARANCEUR TURBID (A) 01/27/2017 1325   LABSPEC 1.017 01/27/2017 1325   PHURINE 6.0 01/27/2017 1325   GLUCOSEU NEGATIVE 01/27/2017 1325   HGBUR NEGATIVE 01/27/2017 1325   BILIRUBINUR NEGATIVE 01/27/2017 1325   KETONESUR NEGATIVE 01/27/2017 1325   PROTEINUR 100 (A) 01/27/2017 1325   UROBILINOGEN 0.2 08/28/2014 1144   NITRITE POSITIVE (A) 01/27/2017 1325   LEUKOCYTESUR LARGE (A) 01/27/2017 1325   Sepsis Labs: '@LABRCNTIP'$ (procalcitonin:4,lacticidven:4)   ) Recent Results (from the past 240 hour(s))  Culture, blood (x 2)     Status: None (Preliminary result)   Collection Time: 01/27/17  5:19 PM  Result Value Ref Range Status   Specimen Description BLOOD RIGHT FOREARM  Final   Special Requests   Final    BOTTLES DRAWN AEROBIC ONLY Blood Culture adequate volume   Culture   Final    NO GROWTH < 24 HOURS Performed at [facility], [place] [address], [place], [place] [address]    Report Status PENDING  Incomplete  Culture, blood (x 2)     Status: None (Preliminary result)   Collection Time: 01/27/17  9:51 PM  Result Value Ref Range Status   Specimen Description   Final    BLOOD LEFT FOREARM Performed at [facility], [place] [address], [place], [place] [address]    Special Requests   Final    BOTTLES DRAWN AEROBIC AND ANAEROBIC Blood Culture adequate volume   Culture PENDING  Incomplete   Report Status PENDING  Incomplete   MRSA PCR Screening     Status: Abnormal   Collection Time: 01/28/17 12:26 AM  Result Value Ref Range Status   MRSA by PCR POSITIVE (A) NEGATIVE Final      Radiology Studies: No results found.    Scheduled Meds: . Chlorhexidine Gluconate Cloth  6 each Topical Q0600  . haloperidol  0.5 mg Oral BID  . insulin aspart  0-15 Units Subcutaneous TID WC  . insulin aspart  0-5 Units Subcutaneous QHS  . levothyroxine  112 mcg Oral QAC breakfast  .  metoprolol tartrate  12.5 mg Oral BID  . mupirocin ointment  1 application Nasal BID  . QUEtiapine  100 mg Oral QHS   Continuous Infusions: . sodium chloride 75 mL/hr at 01/29/17 0500  . cefTRIAXone (ROCEPHIN)  IV Stopped (01/28/17 1817)     LOS: 2 days    Time spent: 25 minutes  Greater than 50% of the time spent on counseling and coordinating the care.   Leisa Lenz, MD [facility] Pager [phone]  If 7PM-7AM, please contact night-coverage [email or web address] Password [email or web address] 01/29/2017, 10:25 AM

## 2017-01-29 NOTE — Progress Notes (Signed)
Report received by phone from  4w RN Darrow Bussing, RN. Hence Derrick, [employer]

## 2017-01-29 NOTE — Progress Notes (Signed)
Pt received to 1612 from 4w. Reggie Bise, [employer]

## 2017-01-29 NOTE — Progress Notes (Signed)
Initial Nutrition Assessment  DOCUMENTATION CODES:   Severe malnutrition in context of chronic illness  INTERVENTION:   Pt would likely need a feeding tube to meet estimated needs; however, this may not be in line with pt's goals of care  Ensure Enlive po BID, each supplement provides 350 kcal and 20 grams of protein  NUTRITION DIAGNOSIS:   Malnutrition (severe) related to  (dementia, advanced age ) as evidenced by severe depletion of muscle mass, severe depletion of body fat, 27 percent weight loss in 5 months.   GOAL:   Patient will meet greater than or equal to 90% of their needs  MONITOR:   PO intake, Supplement acceptance, Labs, Weight trends, Skin  REASON FOR ASSESSMENT:   Malnutrition Screening Tool    ASSESSMENT:   83 year old male with history of advanced dementia, atrial fibrillation, BPH, hypertension who presented to ED from the facility because of multiple falls. On admission he was slightly hypothermic with temperature of 97.42F. His blood work was significant for hemoglobin of 9.5, glucose 150. Urinalysis was suggestive of UTI and he was started on empiric Rocephin while awaiting culture results.   Unable to speak with pt r/t severe dementia. Per sitter at bedside, pt has been sedated on ativan today and has had no interest in eating. Pt was able to drink some water and sitter felt he may drink Ensure. Per chart, pt has lost 48lbs(27%) in 5 months; this is severe weight loss. Pt has not been meeting his estimated needs for some time now. Ideally, pt would likely need a feeding tube to meet his estimated needs; however, this may not be in line with pt's goals of care.   Medications reviewed and include: insulin, synthroid, ceftriaxone, ativan  Labs reviewed: Ca 7.6(L) adj. 9.12 wnl, alb 2.1(L) Hgb 9.0(L), Hct 26.6(L)  Nutrition-Focused physical exam completed. Findings are severe fat and muscle depletions over entire body, and no edema.   Diet Order:  Diet  regular Room service appropriate? Yes; Fluid consistency: Thin  Skin:    multiple wounds and skin tears secondary to falls   Last BM:  PTA  Height:   Ht Readings from Last 1 Encounters:  01/28/17 5\' 10"  (1.778 m)    Weight:   Wt Readings from Last 1 Encounters:  01/28/17 132 lb 4.4 oz (60 kg)    Ideal Body Weight:  75.4 kg  BMI:  Body mass index is 18.98 kg/m.  Estimated Nutritional Needs:   Kcal:  1800-2100kcal/day   Protein:  90-102g/day   Fluid:  >1.8L/day   EDUCATION NEEDS:   Education needs no appropriate at this time  Koleen Distance MS, RD, [place] Pager #[phone] After Hours Pager: [phone]

## 2017-01-30 DIAGNOSIS — E43 Unspecified severe protein-calorie malnutrition: Secondary | ICD-10-CM | POA: Insufficient documentation

## 2017-01-30 LAB — GLUCOSE, CAPILLARY
GLUCOSE-CAPILLARY: 140 mg/dL — AB (ref 65–99)
GLUCOSE-CAPILLARY: 114 mg/dL — AB (ref 65–99)
GLUCOSE-CAPILLARY: 124 mg/dL — AB (ref 65–99)

## 2017-01-30 MED ORDER — HALOPERIDOL 0.5 MG PO TABS
0.5000 mg | ORAL_TABLET | Freq: Two times a day (BID) | ORAL | Status: DC
  Administered 2017-01-30 – 2017-02-03 (×7): 0.5 mg via ORAL
  Filled 2017-01-30 (×9): qty 1

## 2017-01-30 MED ORDER — CIPROFLOXACIN HCL 500 MG PO TABS
500.0000 mg | ORAL_TABLET | Freq: Two times a day (BID) | ORAL | Status: AC
  Administered 2017-01-30 – 2017-02-02 (×8): 500 mg via ORAL
  Filled 2017-01-30 (×9): qty 1

## 2017-01-30 NOTE — Progress Notes (Signed)
PT Cancellation Note  Patient Details Name: Joe [name] MRN: [id number] DOB: 1925-03-12   Cancelled Treatment:    Reason Eval/Treat Not Completed: Patient's level of consciousness, currently asleep, in mitts, RN  Does not want patient to be disturbed.   Marcelino Freestone PT [phone]  01/30/2017, 9:40 AM

## 2017-01-30 NOTE — Progress Notes (Addendum)
Patient ID: Joe [name], male   DOB: 12-16-1924, 83 y.o.   MRN: [id number]  PROGRESS NOTE    DYMOND SPREEN  JKD:[id number] DOB: Feb 18, 1925 DOA: 01/27/2017  PCP: Anda Kraft, MD   Brief Narrative:  83 year old male with history of advanced dementia, atrial fibrillation, BPH, hypertension who presented to ED from the facility because of multiple falls. On admission he was slightly hypothermic with temperature of 97.73F. His blood work was significant for hemoglobin of 9.5, glucose 150. Urinalysis was suggestive of UTI and he was started on empiric Rocephin while awaiting culture results.   Assessment & Plan:   Principal Problem:   Sepsis secondary to UTI ([place]) - Sepsis criteria met on admission with hypothermia, tachycardia, hypotension - Source of infection is UTI, urinalysis showed large leukocytes - Urine culture and blood cultures negative - Patient is on Rocephin. Will change to Cipro to complete a total of 7 days of antibiotic treatment - Lactic acid within normal limits - Appreciate physical therapy evaluation and recommendation    Active Problems:   Essential hypertension - Continue metoprolol    Diabetes mellitus with no complication (HCC) - Continue SSI - CBGs in past 24 hours: 111, 190, 140    A-fib, chronic (HCC) - CHA2DS2-VASc Score is 3 - Not on anticoagulation due to the advanced dementia - Rate controlled with metoprolol    Dementia with behavioral disturbance - Continue Seroquel - Continue Haldol    Hypothyroidism - Continue Synthroid 112 g daily    Severe protein calorie malnutrition - In the context of chronic illness - Nutrition consulted  DVT prophylaxis: SCDs Code Status: full code  Family Communication: No family at the bedside this morning Disposition Plan: Awaiting PT evaluation, discharge in the next 48 hours   Consultants:   PLT  Procedures:   None   Antimicrobials:   Rocephin 01/27/2017 --> 01/30/2017  Cipro  01/30/2017 -->   Subjective: No overnight events.   Objective: Vitals:   01/29/17 0624 01/29/17 1336 01/29/17 2247 01/30/17 0519  BP: 116/65 140/80 (!) 153/100 128/86  Pulse: 100 90 (!) 110 (!) 104  Resp: _0 Temp: 98 F (36.7 C) 98.3 F (36.8 C) 97.9 F (36.6 C) 98.2 F (36.8 C)  TempSrc: Axillary Axillary Oral Axillary  SpO2: 96% 95% 96% 97%  Weight:      Height:        Intake/Output Summary (Last 24 hours) at 01/30/17 0836 Last data filed at 01/30/17 0643  Gross per 24 hour  Intake              945 ml  Output              725 ml  Net              220 ml   Filed Weights   01/28/17 0300  Weight: 60 kg (132 lb 4.4 oz)    Physical Exam  Constitutional: Appears calm, no distress  CVS: Rate controlled, S1/S2 + Pulmonary: Effort and breath sounds normal, no stridor, rhonchi, wheezes, rales.  Abdominal: Soft. BS +,  no distension, tenderness, rebound or guarding.  Musculoskeletal: Normal range of motion. No edema and no tenderness.  Lymphadenopathy: No lymphadenopathy noted, cervical, inguinal. Neuro: Has dementia, no focal deficits Skin: Multiple ulcerations on the scan, forehead  Psychiatric: Not agitated or restless at this time     Data Reviewed: I have personally reviewed following labs and imaging studies  CBC:  Recent Labs Lab  01/27/17 1337 01/27/17 1440 01/28/17 0422 01/29/17 0526  WBC  --  9.1 7.8 8.8  NEUTROABS  --  7.0  --   --   HGB 9.5* 9.8* 8.4* 9.0*  HCT 28.0* 28.7* 24.7* 26.6*  MCV  --  91.7 91.8 92.7  PLT  --  251 255 151   Basic Metabolic Panel:  Recent Labs Lab 01/27/17 1337 01/28/17 0422 01/29/17 0526  NA 137 142 141  K 3.9 3.3* 3.5  CL 102 111 111  CO2  --  24 24  GLUCOSE 150* 81 111*  BUN 25* 22* 12  CREATININE 1.00 1.08 0.88  CALCIUM  --  7.6* 7.6*   GFR: Estimated Creatinine Clearance: 48.3 mL/min (by C-G formula based on SCr of 0.88 mg/dL). Liver Function Tests:  Recent Labs Lab 01/28/17 0422  AST 15   ALT 10*  ALKPHOS 90  BILITOT 0.5  PROT 5.0*  ALBUMIN 2.1*   No results for input(s): LIPASE, AMYLASE in the last 168 hours. No results for input(s): AMMONIA in the last 168 hours. Coagulation Profile: No results for input(s): INR, PROTIME in the last 168 hours. Cardiac Enzymes: No results for input(s): CKTOTAL, CKMB, CKMBINDEX, TROPONINI in the last 168 hours. BNP (last 3 results) No results for input(s): PROBNP in the last 8760 hours. HbA1C: No results for input(s): HGBA1C in the last 72 hours. CBG:  Recent Labs Lab 01/29/17 0740 01/29/17 1256 01/29/17 1707 01/29/17 2234 01/30/17 0711  GLUCAP 87 89 111* 190* 140*   Lipid Profile: No results for input(s): CHOL, HDL, LDLCALC, TRIG, CHOLHDL, LDLDIRECT in the last 72 hours. Thyroid Function Tests: No results for input(s): TSH, T4TOTAL, FREET4, T3FREE, THYROIDAB in the last 72 hours. Anemia Panel: No results for input(s): VITAMINB12, FOLATE, FERRITIN, TIBC, IRON, RETICCTPCT in the last 72 hours. Urine analysis:    Component Value Date/Time   COLORURINE YELLOW 01/27/2017 1325   APPEARANCEUR TURBID (A) 01/27/2017 1325   LABSPEC 1.017 01/27/2017 1325   PHURINE 6.0 01/27/2017 1325   GLUCOSEU NEGATIVE 01/27/2017 1325   HGBUR NEGATIVE 01/27/2017 1325   BILIRUBINUR NEGATIVE 01/27/2017 1325   KETONESUR NEGATIVE 01/27/2017 1325   PROTEINUR 100 (A) 01/27/2017 1325   UROBILINOGEN 0.2 08/28/2014 1144   NITRITE POSITIVE (A) 01/27/2017 1325   LEUKOCYTESUR LARGE (A) 01/27/2017 1325   Sepsis Labs: _0 (procalcitonin:4,lacticidven:4)   Recent Results (from the past 240 hour(s))  Culture, blood (x 2)     Status: None (Preliminary result)   Collection Time: 01/27/17  5:19 PM  Result Value Ref Range Status   Specimen Description BLOOD RIGHT FOREARM  Final   Special Requests   Final    BOTTLES DRAWN AEROBIC ONLY Blood Culture adequate volume   Culture   Final    NO GROWTH < 24 HOURS Performed at [facility],  [place] [address] [place], [place] [address]    Report Status PENDING  Incomplete  Culture, blood (x 2)     Status: None (Preliminary result)   Collection Time: 01/27/17  9:51 PM  Result Value Ref Range Status   Specimen Description   Final    BLOOD LEFT FOREARM Performed at [facility], [place] [address] [place], [place] [address]    Special Requests   Final    BOTTLES DRAWN AEROBIC AND ANAEROBIC Blood Culture adequate volume   Culture PENDING  Incomplete   Report Status PENDING  Incomplete  MRSA PCR Screening     Status: Abnormal   Collection Time: 01/28/17  12:26 AM  Result Value Ref Range Status   MRSA by PCR POSITIVE (A) NEGATIVE Final      Radiology Studies: No results found.    Scheduled Meds: . Chlorhexidine Gluconate Cloth  6 each Topical Q0600  . feeding supplement (ENSURE ENLIVE)  237 mL Oral TID BM  . haloperidol  0.5 mg Oral BID  . insulin aspart  0-15 Units Subcutaneous TID WC  . insulin aspart  0-5 Units Subcutaneous QHS  . levothyroxine  112 mcg Oral QAC breakfast  . metoprolol tartrate  12.5 mg Oral BID  . mupirocin ointment  1 application Nasal BID  . QUEtiapine  100 mg Oral QHS   Continuous Infusions: . sodium chloride 75 mL/hr at 01/30/17 5927  . cefTRIAXone (ROCEPHIN)  IV Stopped (01/29/17 1737)     LOS: 3 days    Time spent: 25 minutes  Greater than 50% of the time spent on counseling and coordinating the care.   Leisa Lenz, MD [facility] Pager [phone]  If 7PM-7AM, please contact night-coverage [email or web address] Password [email or web address] 01/30/2017, 8:36 AM

## 2017-01-31 LAB — GLUCOSE, CAPILLARY
GLUCOSE-CAPILLARY: 105 mg/dL — AB (ref 65–99)
GLUCOSE-CAPILLARY: 134 mg/dL — AB (ref 65–99)
Glucose-Capillary: 97 mg/dL (ref 65–99)
Glucose-Capillary: 270 mg/dL — ABNORMAL HIGH (ref 65–99)

## 2017-01-31 MED ORDER — HALOPERIDOL 0.5 MG PO TABS: 0.5000 mg | ORAL_TABLET | Freq: Two times a day (BID) | ORAL | 0 refills | Status: AC

## 2017-01-31 MED ORDER — INSULIN ASPART 100 UNIT/ML ~~LOC~~ SOLN: 0.0000 [IU] | Freq: Three times a day (TID) | SUBCUTANEOUS | 11 refills | Status: AC

## 2017-01-31 MED ORDER — LORAZEPAM 0.5 MG PO TABS: 0.5000 mg | ORAL_TABLET | Freq: Every day | ORAL | 0 refills | Status: AC

## 2017-01-31 MED ORDER — LORAZEPAM 1 MG PO TABS
1.0000 mg | ORAL_TABLET | Freq: Once | ORAL | Status: AC
  Administered 2017-01-31: 1 mg via ORAL
  Filled 2017-01-31: qty 1

## 2017-01-31 MED ORDER — CIPROFLOXACIN HCL 500 MG PO TABS: 500.0000 mg | ORAL_TABLET | Freq: Two times a day (BID) | ORAL | 0 refills | Status: AC

## 2017-01-31 NOTE — Clinical Social Work Note (Signed)
Clinical Social Work Assessment  Patient Details  Name: Joe [name] MRN: [id number] Date of Birth: Mar 09, 1925  Date of referral:  01/31/17               Reason for consult:  Discharge Planning                Permission sought to share information with:  Case Manager, Facility Sport and exercise psychologist, Family Supports Permission granted to share information::  Yes, Verbal Permission Granted  Name::        Agency::  [facility]  Relationship::  Joe [name] (POA) sister  Contact Information:     Housing/Transportation Living arrangements for the past 2 months:  [place] (Memory Care Unit) Source of Information:  Medical Team, Case Manager, Facility Patient Interpreter Needed:  None Criminal Activity/Legal Involvement Pertinent to Current Situation/Hospitalization:  No - Comment as needed Significant Relationships:  Other Family Members, Siblings, Warehouse manager Lives with:  Facility Resident Do you feel safe going back to the place where you live?  Yes Need for family participation in patient care:  Yes (Comment)  Care giving concerns:  Patient admitted to [facility] post fall.  He is from memory care unit (locked unit) where he has been living for the last 6 months.  Patient wanders and up most of the night where he endures these falls and sleeps most of the day per the facility. He requires supervision and assistance with ADLs, but does not use assistive devices to walk.  Patient does use a wheelchair from time to time. Patient also currently has and being treated for a UTI. Patient has been agitated throughout admission and needing restraint means: Mitts.  Per facility this is  Baseline for him and he can be agitated, however redirected.  His POA is his 52 year old sister Joe [name].  He was admitted from home when he came to [facility].  [facility] is not refusing to accept patient back, but wants to explore if SNF (even for a week) would be helpful for  patient.  Patient has been unable to work with PT due to agitation and mitts.  Per facility he is independent at baseline, just becomes confused and will fall.  Patient also receives hospice at [facility] but facility was unclear to tell me which agency.   Due to patient not able to participate in therapy per notes, it is unclear if he would be able to admit to facility under his [facility] Medicare.  If patient revokes hospice while in care at facility he may be able to admit under medicare for only a short time if there was a facility willing to accept patient.   Social Worker assessment / plan:  Assessment and consult completed. Patient has active orders for discharge today per MD.  LCSW was alerted on day of discharge patient needs and requested PT to also work with patient.  LCSW will speak with POA and define her wishes for patient's care for return or attempting higher level with SNF.  Patient needs to be sitter free/mitts free for 24 hours prior to any return to placement.  Plan: still pending and working on disposition.  Employment status:  Retired Nurse, adult PT Recommendations:  Not assessed at this time (unable to assess) Information / Referral to community resources:  [facility]  Patient/Family's Response to care:  Assessing  Patient/Family's Understanding of and Emotional Response to Diagnosis, Current Treatment, and Prognosis:  Assessing wishes of sister.  Emotional Assessment Appearance:  Appears stated age Attitude/Demeanor/Rapport:  Reactive, Other (Agitated, in Mitts) Affect (typically observed):  Agitated Orientation:  Oriented to Self Alcohol / Substance use:  Not Applicable Psych involvement (Current and /or in the community):  No (Comment)  Discharge Needs  Concerns to be addressed:  Care Coordination, Financial / Insurance Concerns Readmission within the last 30 days:  Yes Current discharge risk:  None Barriers to  Discharge:  Facility will not accept until restraint criteria met, Requiring sitter/restraints   [place], LCSW 01/31/2017, 1:12 PM

## 2017-01-31 NOTE — Discharge Instructions (Signed)
Delirium Delirium is a state of mental confusion. It comes on quickly and causes significant changes in a person's thinking and behavior. People with delirium usually have trouble paying attention to what is going on or knowing where they are. They may become very withdrawn or very emotional and unable to sit still. They may even see or feel things that are not there (hallucinations). Delirium is a sign of a serious underlying medical condition. What are the causes? Delirium occurs when something suddenly affects the signals that the brain sends out. Brain signals can be affected by anything that puts severe stress on the body and brain and causes brain chemicals to be out of balance. The most common causes of delirium include:  Infections. These may be bacterial, viral, fungal, or protozoal.  Medicines. These include many over-the-counter and prescription medicines.  Recreational drugs.  Substance withdrawal. This occurs with sudden discontinuation of alcohol, certain medicines, or recreational drugs.  Surgery.  Sudden vascular events, such as stroke, brain hemorrhage, and severe migraine.  Other brain disorders, such as tumors, seizures, and physical head trauma.  Metabolic disorders, such as kidney or liver failure.  Low blood oxygen (anoxia). This may occur with lung disease, cardiac arrest, or carbon monoxide poisoning.  Hormone imbalances (endocrinopathies), such as an overactive thyroid (hyperthyroidism) or underactive thyroid (hypothyroidism).  Vitamin deficiencies.  What increases the risk? This condition is more likely to develop in:  Children.  Older people.  People who live alone.  People who have vision loss or hearing loss.  People who have existing brain disease, such as dementia.  People who have long-lasting (chronic) medical conditions, such as heart disease.  People who are hospitalized for long periods of time.  What are the signs or symptoms? Delirium  starts with a sudden change in a person's thinking or behavior. Symptoms come and go (fluctuate) over time, and they are often worse at the end of the day. Symptoms include:  Not being able to stay awake (drowsiness) or pay attention.  Being confused about places, time, and people.  Forgetfulness.  Having extreme energy levels. These may be low or high.  Changes in sleep patterns.  Extreme mood swings, such as anger or anxiety.  Focusing on things or ideas that are not important.  Rambling and senseless talking.  Difficulty speaking, understanding speech, or both.  Hallucinations.  Tremor or unsteady gait.  How is this diagnosed? People with delirium may not realize that they have the condition. Often, a family member or health care provider is the first person to notice the changes. The health care provider will obtain a detailed history of current symptoms, medical issues, medicines, and recreational drug use. The health care provider will perform a mental status examination by:  Asking questions to check for confusion.  Watching for abnormal behavior.  The health care provider may perform a physical exam and order lab tests or additional studies to determine the cause of the delirium. How is this treated? Treatment of delirium depends on the cause and severity. Delirium usually goes away within days or weeks of treating the underlying cause. In the meantime, the person should not be left alone because he or she may accidentally cause self-harm. Treatment includes supportive care, such as:  Increased light during the day and decreased light at night.  Low noise level.  Uninterrupted sleep.  A regular daily schedule.  Clocks and calendars to help with orientation.  Familiar objects, including the person's pictures and clothing.  Frequent visits   from familiar family and friends.  Healthy diet.  Exercise.  In more severe cases of delirium, medicine may be  prescribed to help the person to keep calm and think more clearly. Follow these instructions at home:  Any supportive care should be continued as told by the health care provider.  All medicines should be used as told by the health care provider. This is important.  The health care provider should be consulted before over-the-counter medicines, herbs, or supplements are used.  All follow-up visits should be kept as told by the health care provider. This is important.  Alcohol and recreational drugs should be avoided as told by the health care provider. Contact a health care provider if:  Symptoms do not get better or they become worse.  New symptoms of delirium develop.  Caring for the person at home does not seem safe.  Eating, drinking, or communicating stops.  There are side effects of medicines, such as changes in sleep patterns, dizziness, weight gain, restlessness, movement changes, or tremors. Get help right away if:  Serious thoughts occur about self-harm or about hurting others.  There are serious side effects of medicine, such as: ? Swelling of the face, lips, tongue, or throat. ? Fever, confusion, muscle spasms, or seizures. This information is not intended to replace advice given to you by your health care provider. Make sure you discuss any questions you have with your health care provider. Document Released: 01/11/2012 Document Revised: 09/24/2015 Document Reviewed: 06/11/2014 Elsevier Interactive Patient Education  2018 Elsevier Inc.  

## 2017-01-31 NOTE — Progress Notes (Signed)
PT Cancellation Note  Patient Details Name: Joe [name] MRN: [id number] DOB: [birth year]   Cancelled Treatment:    Reason Eval/Treat Not Completed: Patient's level of consciousness , patient is unable to participate in  Skilled PT at this time. PT will sign off.   Marcelino Freestone PT [phone]  01/31/2017, 1:05 PM

## 2017-01-31 NOTE — Progress Notes (Signed)
PT Cancellation Note  Patient Details Name: DEUNDRA BARD MRN: [id number] DOB: 08/05/1924   Cancelled Treatment:    Reason Eval/Treat Not Completed: Patient's level of consciousness, patient is  Unable to participate in skilled PT at this time, requires medication  For agitation.  PT to sign off   Claretha Cooper 01/31/2017, 1:09 PM Tresa Endo PT [phone]

## 2017-01-31 NOTE — Progress Notes (Addendum)
-  LCSW following for disposition:  Return to [facility]   Due to patient unable to participate in therapy and PT signing off as patient not appropriate, patient will have to return to [facility].  Call placed to facility, discussed current barriers with PT and SNF not being an option. Facility is Geophysical data processor regarding if patient can return today as he has been in [employer]. Awaiting call back from facility to confirm if patient can DC back today or tomorrow due to policy of restraints.  LCSW will update FL2 and assist with disposition once call returned. Patient is active with community hospice, spoke to [facility] and updated regarding plans for patient.  Will resume hospice at the facility.   1:59 PM; Discussed with [facility] that patient needs to be 24 hour restraint free.  Will update RN and MD along with family regarding delay.   3:20 PM LCSW attempted to speak with administrator again regarding patient return.  At this time they continue to report they are not taking him back due to need of assessment.    3:46 PM LCSW sat with POA sister and her husband Roselie Awkward to discuss plans for patient.  Both have difficulty with hearing, but report they are in no position financially to pay privately for patient to go to nursing facility under private pay.  Roselie Awkward is asking if they could hire a sitter at the facility to assist as patient wanders and does not listen to anyone.  LCSW will discuss with facility.  Both family members are aware he cannot participate in therapy due to memory.   Call placed to AD regarding assistance as patient has been discharged inappropriately with no plan and not issued a 30 day notice.  Patient remains in room at this time resting comfortably. LCSW will allow AD to follow up with facility regarding discharge plan as this is the only option for patient.  RN on unit is aware of barriers regarding discharge and updated on plan. Roselie Awkward to be called in the  morning [phone] and updated on plan.  Both POA and Roselie Awkward are wanting patient to return to memory care unit as planned from admission [facility]).  3:53 PM Alinda from facility called back.  Continues to report that patient cannot return at this time as regional RNs report he is not appropriate, but no one has completed a face to face as requested by this Probation officer. Facility will send RN tomorrow to complete a face to face, however time is unknown. LCSW informed facility that family cannot pay privately for long term care and this was explored and addressed.  Facility to call family this evening.   Plan: TBD.  Per [facility]:  " Cannot accept him back today". AD with CSW has been informed regarding barriers to discharge. Will discuss in LOS meeting on Thursday morning.    Lane Hacker, MSW Clinical Social Work: [employer] Coverage for :  [phone]

## 2017-01-31 NOTE — Discharge Summary (Signed)
Physician Discharge Summary  Joe [name] KNL:[id number] DOB: [birth year] DOA: 01/27/2017  PCP: Anda Kraft, MD  Admit date: 01/27/2017 Discharge date: 01/31/2017  Recommendations for Outpatient Follow-up:  1. Continue Cipro for 4 days on discharge  Discharge Diagnoses:  Principal Problem:   Sepsis secondary to UTI [facility]) Active Problems:   Essential hypertension   Diabetes mellitus with no complication (HCC)   A-fib (HCC)   Syncope and collapse   Orthostatic hypotension   Protein-calorie malnutrition, severe    Discharge Condition: stable   Diet recommendation: as tolerated   History of present illness:  83 year old male with history of advanced dementia, atrial fibrillation, BPH, hypertension who presented to ED from the facility because of multiple falls. On admission he was slightly hypothermic with temperature of 97.29F. His blood work was significant for hemoglobin of 9.5, glucose 150. Urinalysis was suggestive of UTI and he was started on empiric Rocephin while awaiting culture results.   Hospital Course:   Principal Problem:   Sepsis secondary to UTI ([place]) - Sepsis criteria met on admission with hypothermia, tachycardia, hypotension - Source of infection is UTI, urinalysis showed large leukocytes - Blood cultures negative - Lactic acid is within normal limits  - Change to ciprofloxacin and will continue for 4 days on discharge   Active Problems:   Essential hypertension - Continue metoprolol    Diabetes mellitus with no complication (HCC) - Continue sliding scale insulin and Tradjenta     A-fib, chronic (HCC) - CHA2DS2-VASc Score is 3 - Not on anticoagulation due to the advanced dementia - Rate controlled with metoprolol    Dementia with behavioral disturbance - Continue Zoloft, Seroquel, Haldol    Hypothyroidism - Continue Synthroid    Severe protein calorie malnutrition - In the context of chronic illness - Nutrition consulted  DVT  prophylaxis:  SCDs Code Status: full code  Family Communication:  no family at the bedside this morning, tred calling patient's sister, left my cell number to call me back for updates    Consultants:   PLT  Procedures:   None   Antimicrobials:   Rocephin 01/27/2017 --> 01/30/2017  Cipro 01/30/2017 --> for 4 days on discharge  Signed:  Leisa Lenz, MD  [facility] 01/31/2017, 12:14 PM  Pager #: [phone]  Time spent in minutes: more than 30 minutes    Discharge Exam: Vitals:   01/30/17 2222 01/31/17 0655  BP: (!) 153/98 (!) 129/93  Pulse: (!) 134 (!) 114  Resp: (!) 22 19  Temp: 98.8 F (37.1 C) 98.7 F (37.1 C)  SpO2: 97% 99%   Vitals:   01/30/17 1106 01/30/17 1322 01/30/17 2222 01/31/17 0655  BP: 121/87 123/72 (!) 153/98 (!) 129/93  Pulse: 97 (!) 104 (!) 134 (!) 114  Resp:  19 (!) 22 19  Temp: 97.6 F (36.4 C) 97.7 F (36.5 C) 98.8 F (37.1 C) 98.7 F (37.1 C)  TempSrc: Axillary Axillary Axillary Axillary  SpO2:  97% 97% 99%  Weight:      Height:        General: Pt is alert, not in acute distress Cardiovascular: Regular rate and rhythm, S1/S2 + Respiratory: Clear to auscultation bilaterally, no wheezing Abdominal: Soft, non tender, non distended, bowel sounds +, no guarding Extremities: no edema, no cyanosis, pulses palpable bilaterally DP and PT Neuro: has dementia   Discharge Instructions  Discharge Instructions    Call MD for:  persistant nausea and vomiting    Complete by:  As directed  Call MD for:  redness, tenderness, or signs of infection (pain, swelling, redness, odor or green/yellow discharge around incision site)    Complete by:  As directed    Call MD for:  severe uncontrolled pain    Complete by:  As directed    Diet - low sodium heart healthy    Complete by:  As directed    Increase activity slowly    Complete by:  As directed      Allergies as of 01/31/2017      Reactions   No Known Allergies        Medication List    STOP taking these medications   levofloxacin 500 MG tablet Commonly known as:  LEVAQUIN     TAKE these medications   acetaminophen 325 MG tablet Commonly known as:  TYLENOL Take 650 mg by mouth every 6 (six) hours as needed for mild pain.   ciprofloxacin 500 MG tablet Commonly known as:  CIPRO Take 1 tablet (500 mg total) by mouth 2 (two) times daily.   haloperidol 0.5 MG tablet Commonly known as:  HALDOL Take 1 tablet (0.5 mg total) by mouth 2 (two) times daily.   insulin aspart 100 UNIT/ML injection Commonly known as:  novoLOG Inject 0-15 Units into the skin 3 (three) times daily with meals.   ketoconazole 2 % shampoo Commonly known as:  NIZORAL Apply 1 application topically 3 (three) times a week.   levothyroxine 112 MCG tablet Commonly known as:  SYNTHROID, LEVOTHROID Take 112 mcg by mouth daily before breakfast.   LORazepam 0.5 MG tablet Commonly known as:  ATIVAN Take 1 tablet (0.5 mg total) by mouth daily. May take 1 mg q4hprn for agitation   metoprolol tartrate 25 MG tablet Commonly known as:  LOPRESSOR Take 12.5 mg by mouth daily.   QUEtiapine 100 MG tablet Commonly known as:  SEROQUEL Take 100 mg by mouth at bedtime.   tamsulosin 0.4 MG Caps capsule Commonly known as:  FLOMAX Take 0.4 mg by mouth daily.   TRADJENTA 5 MG Tabs tablet Generic drug:  linagliptin Take 5 mg by mouth daily.   Vitamin D (Ergocalciferol) 50000 units Caps capsule Commonly known as:  DRISDOL Take 50,000 Units by mouth every 7 (seven) days.      Follow-up Information    Anda Kraft, MD. Schedule an appointment as soon as possible for a visit in 1 week(s).   Specialty:  Endocrinology Contact information: [address] [place] [address] [phone]            The results of significant diagnostics from this hospitalization (including imaging, microbiology, ancillary and laboratory) are listed below for reference.     Significant Diagnostic Studies: Ct Head Wo Contrast  Result Date: 01/25/2017 CLINICAL DATA:  Post unwitnessed fall with facial hematoma and scalp injury. EXAM: CT HEAD WITHOUT CONTRAST CT MAXILLOFACIAL WITHOUT CONTRAST CT CERVICAL SPINE WITHOUT CONTRAST TECHNIQUE: Multidetector CT imaging of the head, cervical spine, and maxillofacial structures were performed using the standard protocol without intravenous contrast. Multiplanar CT image reconstructions of the cervical spine and maxillofacial structures were also generated. COMPARISON:  Head and cervical spine CT 12/06/2016, additional priors. FINDINGS: CT HEAD FINDINGS Brain: Stable degree of atrophy and chronic small vessel ischemia. No intracranial hemorrhage, mass effect, or midline shift. No hydrocephalus. The basilar cisterns are patent. No evidence of territorial infarct or acute ischemia. No extra-axial or intracranial fluid collection. Vascular: Atherosclerosis of skullbase vasculature without hyperdense vessel or abnormal calcification. Skull:  No fracture or focal lesion. Other: Right frontal skin staples with hematoma. Scattered air in the scalp about the vertex. CT MAXILLOFACIAL FINDINGS Osseous: Nasal bone, zygomatic arches and mandibles are intact. The temporomandibular joints are congruent with mild degenerative change. There are multiple missing teeth with poor dentition and dental caries. Mild motion artifact limits assessment. Orbits: Both orbits and globes are intact. No orbital fracture. Bilateral cataract resection. Sinuses: Mild mucous retention cyst in the left maxillary sinus, otherwise clear. Soft tissues: Right periorbital soft tissue hematoma. No radiopaque foreign body. CT CERVICAL SPINE FINDINGS Alignment: Unchanged from prior exam. Mild degenerative anterolisthesis of C6 on C7 is stable. Reversal of normal cervical lordosis, unchanged. Skull base and vertebrae: No acute fracture. Remote C7 and T2 compression fractures, unchanged.  Skullbase and dens are intact. Soft tissues and spinal canal: No prevertebral fluid or swelling. No visible canal hematoma. Disc levels: Diffuse disc space narrowing and endplate spurring, most prominent at C3-C4. Multilevel facet arthropathy. Degenerative changes are stable from prior. Upper chest: Negative. Other: Carotid calcifications. IMPRESSION: 1. Scalp injury with skin staples in place and hematoma. No acute intracranial abnormality. No skull fracture. Stable atrophy and chronic small vessel ischemia. 2. Right periorbital soft tissue edema. No acute facial bone fracture. 3. No acute fracture or subluxation of the cervical spine. Degenerative changes are stable from prior. 4. Carotid and skullbase atherosclerosis. Electronically Signed   By: Jeb Levering M.D.   On: 01/25/2017 05:50   Ct Cervical Spine Wo Contrast  Result Date: 01/25/2017 CLINICAL DATA:  Post unwitnessed fall with facial hematoma and scalp injury. EXAM: CT HEAD WITHOUT CONTRAST CT MAXILLOFACIAL WITHOUT CONTRAST CT CERVICAL SPINE WITHOUT CONTRAST TECHNIQUE: Multidetector CT imaging of the head, cervical spine, and maxillofacial structures were performed using the standard protocol without intravenous contrast. Multiplanar CT image reconstructions of the cervical spine and maxillofacial structures were also generated. COMPARISON:  Head and cervical spine CT 12/06/2016, additional priors. FINDINGS: CT HEAD FINDINGS Brain: Stable degree of atrophy and chronic small vessel ischemia. No intracranial hemorrhage, mass effect, or midline shift. No hydrocephalus. The basilar cisterns are patent. No evidence of territorial infarct or acute ischemia. No extra-axial or intracranial fluid collection. Vascular: Atherosclerosis of skullbase vasculature without hyperdense vessel or abnormal calcification. Skull: No fracture or focal lesion. Other: Right frontal skin staples with hematoma. Scattered air in the scalp about the vertex. CT MAXILLOFACIAL  FINDINGS Osseous: Nasal bone, zygomatic arches and mandibles are intact. The temporomandibular joints are congruent with mild degenerative change. There are multiple missing teeth with poor dentition and dental caries. Mild motion artifact limits assessment. Orbits: Both orbits and globes are intact. No orbital fracture. Bilateral cataract resection. Sinuses: Mild mucous retention cyst in the left maxillary sinus, otherwise clear. Soft tissues: Right periorbital soft tissue hematoma. No radiopaque foreign body. CT CERVICAL SPINE FINDINGS Alignment: Unchanged from prior exam. Mild degenerative anterolisthesis of C6 on C7 is stable. Reversal of normal cervical lordosis, unchanged. Skull base and vertebrae: No acute fracture. Remote C7 and T2 compression fractures, unchanged. Skullbase and dens are intact. Soft tissues and spinal canal: No prevertebral fluid or swelling. No visible canal hematoma. Disc levels: Diffuse disc space narrowing and endplate spurring, most prominent at C3-C4. Multilevel facet arthropathy. Degenerative changes are stable from prior. Upper chest: Negative. Other: Carotid calcifications. IMPRESSION: 1. Scalp injury with skin staples in place and hematoma. No acute intracranial abnormality. No skull fracture. Stable atrophy and chronic small vessel ischemia. 2. Right periorbital soft tissue edema. No acute facial  bone fracture. 3. No acute fracture or subluxation of the cervical spine. Degenerative changes are stable from prior. 4. Carotid and skullbase atherosclerosis. Electronically Signed   By: Jeb Levering M.D.   On: 01/25/2017 05:50   Ct Maxillofacial Wo Contrast  Result Date: 01/25/2017 CLINICAL DATA:  Post unwitnessed fall with facial hematoma and scalp injury. EXAM: CT HEAD WITHOUT CONTRAST CT MAXILLOFACIAL WITHOUT CONTRAST CT CERVICAL SPINE WITHOUT CONTRAST TECHNIQUE: Multidetector CT imaging of the head, cervical spine, and maxillofacial structures were performed using the  standard protocol without intravenous contrast. Multiplanar CT image reconstructions of the cervical spine and maxillofacial structures were also generated. COMPARISON:  Head and cervical spine CT 12/06/2016, additional priors. FINDINGS: CT HEAD FINDINGS Brain: Stable degree of atrophy and chronic small vessel ischemia. No intracranial hemorrhage, mass effect, or midline shift. No hydrocephalus. The basilar cisterns are patent. No evidence of territorial infarct or acute ischemia. No extra-axial or intracranial fluid collection. Vascular: Atherosclerosis of skullbase vasculature without hyperdense vessel or abnormal calcification. Skull: No fracture or focal lesion. Other: Right frontal skin staples with hematoma. Scattered air in the scalp about the vertex. CT MAXILLOFACIAL FINDINGS Osseous: Nasal bone, zygomatic arches and mandibles are intact. The temporomandibular joints are congruent with mild degenerative change. There are multiple missing teeth with poor dentition and dental caries. Mild motion artifact limits assessment. Orbits: Both orbits and globes are intact. No orbital fracture. Bilateral cataract resection. Sinuses: Mild mucous retention cyst in the left maxillary sinus, otherwise clear. Soft tissues: Right periorbital soft tissue hematoma. No radiopaque foreign body. CT CERVICAL SPINE FINDINGS Alignment: Unchanged from prior exam. Mild degenerative anterolisthesis of C6 on C7 is stable. Reversal of normal cervical lordosis, unchanged. Skull base and vertebrae: No acute fracture. Remote C7 and T2 compression fractures, unchanged. Skullbase and dens are intact. Soft tissues and spinal canal: No prevertebral fluid or swelling. No visible canal hematoma. Disc levels: Diffuse disc space narrowing and endplate spurring, most prominent at C3-C4. Multilevel facet arthropathy. Degenerative changes are stable from prior. Upper chest: Negative. Other: Carotid calcifications. IMPRESSION: 1. Scalp injury with skin  staples in place and hematoma. No acute intracranial abnormality. No skull fracture. Stable atrophy and chronic small vessel ischemia. 2. Right periorbital soft tissue edema. No acute facial bone fracture. 3. No acute fracture or subluxation of the cervical spine. Degenerative changes are stable from prior. 4. Carotid and skullbase atherosclerosis. Electronically Signed   By: Jeb Levering M.D.   On: 01/25/2017 05:50    Microbiology: Culture, blood (x 2)     Status: None (Preliminary result)   Collection Time: 01/27/17  5:19 PM  Result Value Ref Range Status   Specimen Description BLOOD RIGHT FOREARM  Final   Special Requests   Final    BOTTLES DRAWN AEROBIC ONLY Blood Culture adequate volume   Culture   Final    NO GROWTH 3 DAYS Performed at [facility], [address], [place], [place] [address]    Report Status PENDING  Incomplete  Culture, blood (x 2)     Status: None (Preliminary result)   Collection Time: 01/27/17  9:51 PM  Result Value Ref Range Status   Specimen Description BLOOD LEFT FOREARM  Final   Special Requests   Final    BOTTLES DRAWN AEROBIC AND ANAEROBIC Blood Culture adequate volume   Culture   Final    NO GROWTH 2 DAYS Performed at [facility], [place] [address] [place], [place] [address]    Report  Status PENDING  Incomplete  MRSA PCR Screening     Status: Abnormal   Collection Time: 01/28/17 12:26 AM  Result Value Ref Range Status   MRSA by PCR POSITIVE (A) NEGATIVE Final     Labs: Basic Metabolic Panel:  Recent Labs Lab 01/27/17 1337 01/28/17 0422 01/29/17 0526  NA 137 142 141  K 3.9 3.3* 3.5  CL 102 111 111  CO2  --  24 24  GLUCOSE 150* 81 111*  BUN 25* 22* 12  CREATININE 1.00 1.08 0.88  CALCIUM  --  7.6* 7.6*   Liver Function Tests:  Recent Labs Lab 01/28/17 0422  AST 15  ALT 10*  ALKPHOS 90  BILITOT 0.5  PROT 5.0*  ALBUMIN 2.1*   No results for input(s): LIPASE, AMYLASE in the last 168 hours. No results for  input(s): AMMONIA in the last 168 hours. CBC:  Recent Labs Lab 01/27/17 1337 01/27/17 1440 01/28/17 0422 01/29/17 0526  WBC  --  9.1 7.8 8.8  NEUTROABS  --  7.0  --   --   HGB 9.5* 9.8* 8.4* 9.0*  HCT 28.0* 28.7* 24.7* 26.6*  MCV  --  91.7 91.8 92.7  PLT  --  251 255 275   Cardiac Enzymes: No results for input(s): CKTOTAL, CKMB, CKMBINDEX, TROPONINI in the last 168 hours. BNP: BNP (last 3 results) No results for input(s): BNP in the last 8760 hours.  ProBNP (last 3 results) No results for input(s): PROBNP in the last 8760 hours.  CBG:  Recent Labs Lab 01/30/17 0711 01/30/17 1143 01/30/17 1730 01/31/17 0044 01/31/17 0722  GLUCAP 140* 114* 124* 134* 97

## 2017-01-31 NOTE — NC FL2 (Signed)
[place] LEVEL OF CARE SCREENING TOOL     IDENTIFICATION  Patient Name: Joe [name] Birthdate: 05-22-1924 Sex: male Admission Date (Current Location): 01/27/2017  [facility] and [place] Number:  Herbalist and Address:  [facility],  [place], [place]      Provider Number: [id number]  Attending Physician Name and Address:  Robbie Lis, MD  Relative Name and Phone Number:       Current Level of Care: Hospital Recommended Level of Care: Memory Care Prior Approval Number:    Date Approved/Denied:   PASRR Number:    Discharge Plan: Other (Comment) (Return to [facility] )    Current Diagnoses: Patient Active Problem List   Diagnosis Date Noted  . Protein-calorie malnutrition, severe 01/30/2017  . Sepsis secondary to UTI ([place]) 01/27/2017  . Adjustment disorder with mixed anxiety and depressed mood 11/13/2014  . Cognitive deficits 11/13/2014  . Syncope and collapse 11/10/2014  . Orthostatic hypotension 11/10/2014  . Hypothyroidism 08/28/2014  . Essential hypertension 08/28/2014  . Atrial fibrillation, new onset ([place]) 08/28/2014  . Dizziness 08/28/2014  . BPH (benign prostatic hypertrophy) 08/28/2014  . Diabetes mellitus with no complication ([place]) 71/69/6789  . Risky sexual behavior 08/28/2014  . A-fib ([place]) 08/28/2014  . Hypothyroid 09/10/2013  . BPH (benign prostatic hyperplasia) 09/10/2013    Orientation RESPIRATION BLADDER Height & Weight     Self  Normal Incontinent Weight: 132 lb 4.4 oz (60 kg) Height:  5\' 10"  (177.8 cm)  BEHAVIORAL SYMPTOMS/MOOD NEUROLOGICAL BOWEL NUTRITION STATUS  [place]   Incontinent Diet (resume diet)  AMBULATORY STATUS COMMUNICATION OF NEEDS Skin   Supervision Verbally Skin abrasions, Bruising, Other (Comment) (laceration)                       Personal Care Assistance Level of Assistance  Bathing, Feeding, Dressing Bathing Assistance: Maximum assistance Feeding  assistance: Limited assistance Dressing Assistance: Maximum assistance     Functional Limitations Info             SPECIAL CARE FACTORS FREQUENCY                       Contractures Contractures Info: Not present    Additional Factors Info  Code Status, Allergies, Isolation Precautions Code Status Info: Full Code Allergies Info: NKA     Isolation Precautions Info: On Contact: MRSA     Current Medications (01/31/2017):  This is the current hospital active medication list Current Facility-Administered Medications  Medication Dose Route Frequency Provider Last Rate Last Dose  . 0.9 %  sodium chloride infusion   Intravenous Continuous Donne Hazel, MD 75 mL/hr at 01/30/17 [phone]    . acetaminophen (TYLENOL) tablet 650 mg  650 mg Oral Q6H PRN Donne Hazel, MD   650 mg at 01/31/17 0456   Or  . acetaminophen (TYLENOL) suppository 650 mg  650 mg Rectal Q6H PRN Donne Hazel, MD      . Chlorhexidine Gluconate Cloth 2 % PADS 6 each  6 each Topical Q0600 Robbie Lis, MD   6 each at 01/31/17 0457  . ciprofloxacin (CIPRO) tablet 500 mg  500 mg Oral BID Robbie Lis, MD   500 mg at 01/31/17 1751  . feeding supplement (ENSURE ENLIVE) (ENSURE ENLIVE) liquid 237 mL  237 mL Oral TID BM Robbie Lis, MD   237 mL at 01/31/17 0928  . haloperidol (HALDOL)  tablet 0.5 mg  0.5 mg Oral BID Robbie Lis, MD   0.5 mg at 01/31/17 0456  . insulin aspart (novoLOG) injection 0-15 Units  0-15 Units Subcutaneous TID WC Donne Hazel, MD   Stopped at 01/30/17 1329  . insulin aspart (novoLOG) injection 0-5 Units  0-5 Units Subcutaneous QHS Donne Hazel, MD      . levothyroxine (SYNTHROID, LEVOTHROID) tablet 112 mcg  112 mcg Oral QAC breakfast Donne Hazel, MD   112 mcg at 01/31/17 1610  . LORazepam (ATIVAN) injection 1 mg  1 mg Intravenous Q4H PRN Lovey Newcomer T, NP   1 mg at 01/29/17 1804  . metoprolol tartrate (LOPRESSOR) tablet 12.5 mg  12.5 mg Oral BID Donne Hazel, MD   12.5  mg at 01/31/17 9604  . mupirocin ointment (BACTROBAN) 2 % 1 application  1 application Nasal BID Robbie Lis, MD   1 application at 54/09/81 [phone]  . QUEtiapine (SEROQUEL) tablet 100 mg  100 mg Oral QHS Donne Hazel, MD   100 mg at 01/30/17 2212   Facility-Administered Medications Ordered in Other Encounters  Medication Dose Route Frequency Provider Last Rate Last Dose  . cyclopentolate (CYCLODRYL,CYCLOGYL) 1 % ophthalmic solution 1 drop  1 drop Left Eye PRN Rankin, Clent Demark, MD      . gatifloxacin (ZYMAXID) 0.5 % ophthalmic drops 1 drop  1 drop Left Eye PRN Rankin, Clent Demark, MD         Discharge Medications: STOP taking these medications   levofloxacin 500 MG tablet Commonly known as:  LEVAQUIN     TAKE these medications   acetaminophen 325 MG tablet Commonly known as:  TYLENOL Take 650 mg by mouth every 6 (six) hours as needed for mild pain.   ciprofloxacin 500 MG tablet Commonly known as:  CIPRO Take 1 tablet (500 mg total) by mouth 2 (two) times daily.   haloperidol 0.5 MG tablet Commonly known as:  HALDOL Take 1 tablet (0.5 mg total) by mouth 2 (two) times daily.   insulin aspart 100 UNIT/ML injection Commonly known as:  novoLOG Inject 0-15 Units into the skin 3 (three) times daily with meals.   ketoconazole 2 % shampoo Commonly known as:  NIZORAL Apply 1 application topically 3 (three) times a week.   levothyroxine 112 MCG tablet Commonly known as:  SYNTHROID, LEVOTHROID Take 112 mcg by mouth daily before breakfast.   LORazepam 0.5 MG tablet Commonly known as:  ATIVAN Take 1 tablet (0.5 mg total) by mouth daily. May take 1 mg q4hprn for agitation   metoprolol tartrate 25 MG tablet Commonly known as:  LOPRESSOR Take 12.5 mg by mouth daily.   QUEtiapine 100 MG tablet Commonly known as:  SEROQUEL Take 100 mg by mouth at bedtime.   tamsulosin 0.4 MG Caps capsule Commonly known as:  FLOMAX Take 0.4 mg by mouth daily.   TRADJENTA 5 MG Tabs  tablet Generic drug:  linagliptin Take 5 mg by mouth daily.   Vitamin D (Ergocalciferol) 50000 units Caps capsule Commonly known as:  DRISDOL Take 50,000 Units by mouth every 7 (seven) days.     Relevant Imaging Results:  Relevant Lab Results:   Additional Information resume hospice services at the facility.  Lilly [place], LCSW

## 2017-02-01 LAB — GLUCOSE, CAPILLARY
GLUCOSE-CAPILLARY: 157 mg/dL — AB (ref 65–99)
GLUCOSE-CAPILLARY: 148 mg/dL — AB (ref 65–99)
Glucose-Capillary: 71 mg/dL (ref 65–99)
Glucose-Capillary: 135 mg/dL — ABNORMAL HIGH (ref 65–99)

## 2017-02-01 LAB — CULTURE, BLOOD (ROUTINE X 2)
CULTURE: NO GROWTH
SPECIAL REQUESTS: ADEQUATE

## 2017-02-01 MED ORDER — VITAMINS A & D EX OINT
TOPICAL_OINTMENT | CUTANEOUS | Status: AC
  Filled 2017-02-01: qty 5

## 2017-02-01 MED ORDER — LORAZEPAM 1 MG PO TABS
1.0000 mg | ORAL_TABLET | Freq: Once | ORAL | Status: AC
  Administered 2017-02-01: 1 mg via ORAL
  Filled 2017-02-01: qty 1

## 2017-02-01 NOTE — Progress Notes (Addendum)
LCSW: Following for disposition:  Return to memory care: [facility] followed up with RN this morning.  Patient had a good night with no sitters or mitts. He will be mitts free after 12:00 today, thus will anticipate his return to [facility]  Will need updated DC summary note, noting not changes. Will update facility regarding return.  All clinicals sent on 10/2.  LCSW will speak with facility to arrange for transport, once orders placed confirming DC.  11:45 AM LCSW spoke with [facility] who will call back with time for patient/discharge. Updated MD note sent via fax.  Awaiting call back from [facility] regarding plans and will facilitate DC.  LCSW called Bambi to update regarding status of patient. No answer, awaiting call back.  LCSW has spoken to [facility] again. They are awaiting DON to approve return.   2:16 PM LCSW coordinated care with [facility] Hospice.  She too is working with facility to coordinate care for patient and return him to [facility]. She has called and left messages as this Probation officer is also doing and awaiting call back.    Lane Hacker, MSW Clinical Social Work: [employer] Coverage for :  [phone]

## 2017-02-01 NOTE — Progress Notes (Addendum)
Miss [name] from [name] who stated pt's admission to [name] was denied.  Miss [name] stated the nurse administrators who originally reviewed pt's case "stand by their original decision not to admit" the pt.  10:07 PM CSW updated CSW Asst Director who stated next step would be for CSW Asst Director to contact authorities regarding improper D/C.  CSW will leave handoff for daytime medical CSW.  Please reconsult if future social work needs arise.  CSW signing off, as social work intervention is no longer needed.  Joe [name]. [name], LCSW, LCAS, CSI Clinical Social Worker Ph: [phone]

## 2017-02-01 NOTE — Progress Notes (Addendum)
CSW received a call from [facility] is en route to assess the pt for possible placement.   CSW met with [facility] admissions and answered questions about medications and faxed progress notes related to when pt's restraints/mits were removed.  [facility] will review pt's notes sent via the hub and will call CSW with decision on admittance to [facility].   Please reconsult if future social work needs arise.  CSW signing off, as social work intervention is no longer needed.  Alphonse Guild. Maraki Macquarrie, LCSW, LCAS, CSI Clinical Social Worker Ph: [phone]

## 2017-02-01 NOTE — Plan of Care (Signed)
Problem: Activity: Goal: Risk for activity intolerance will decrease Outcome: Not Progressing Patient remains very unsteady and is only minimally able to follow directions.

## 2017-02-01 NOTE — Progress Notes (Signed)
Patient seen and examined. Patient was waiting on discharge to his nursing facility. Discharge summary reviewed. No changes made to the summary. No new events since yesterday. Patient did well off of restraints. Patient with history of advanced dementia who presented with a urinary tract infection. He is being discharged on oral antibiotics. It also appears that he is under hospice care at the nursing facility. Appears to be stable for discharge today. Please review discharge summary dictated by Dr. Charlies Silvers for further details.  Bonnielee Haff

## 2017-02-02 LAB — CBC
HCT: 27.4 % — ABNORMAL LOW (ref 39.0–52.0)
Hemoglobin: 9.3 g/dL — ABNORMAL LOW (ref 13.0–17.0)
MCH: 31.2 pg (ref 26.0–34.0)
MCHC: 33.9 g/dL (ref 30.0–36.0)
MCV: 91.9 fL (ref 78.0–100.0)
PLATELETS: 304 10*3/uL (ref 150–400)
RBC: 2.98 MIL/uL — ABNORMAL LOW (ref 4.22–5.81)
RDW: 15.1 % (ref 11.5–15.5)
WBC: 8.9 10*3/uL (ref 4.0–10.5)

## 2017-02-02 LAB — BASIC METABOLIC PANEL
ANION GAP: 6 (ref 5–15)
BUN: 9 mg/dL (ref 6–20)
CALCIUM: 7.9 mg/dL — AB (ref 8.9–10.3)
CO2: 28 mmol/L (ref 22–32)
Chloride: 105 mmol/L (ref 101–111)
Creatinine, Ser: 0.84 mg/dL (ref 0.61–1.24)
GFR calc Af Amer: >60 mL/min (ref >60)
GFR calc non Af Amer: >60 mL/min (ref >60)
GLUCOSE: 185 mg/dL — AB (ref 65–99)
Potassium: 3.8 mmol/L (ref 3.5–5.1)
Sodium: 139 mmol/L (ref 135–145)

## 2017-02-02 LAB — CULTURE, BLOOD (ROUTINE X 2)
Culture: NO GROWTH
Special Requests: ADEQUATE

## 2017-02-02 LAB — GLUCOSE, CAPILLARY
GLUCOSE-CAPILLARY: 173 mg/dL — AB (ref 65–99)
GLUCOSE-CAPILLARY: 152 mg/dL — AB (ref 65–99)
Glucose-Capillary: 174 mg/dL — ABNORMAL HIGH (ref 65–99)
Glucose-Capillary: 167 mg/dL — ABNORMAL HIGH (ref 65–99)

## 2017-02-02 MED ORDER — LORAZEPAM 0.5 MG PO TABS
0.5000 mg | ORAL_TABLET | Freq: Four times a day (QID) | ORAL | Status: DC | PRN
  Administered 2017-02-02 – 2017-02-03 (×3): 0.5 mg via ORAL
  Filled 2017-02-02 (×3): qty 1

## 2017-02-02 NOTE — Progress Notes (Addendum)
LCSW continues to follow for disposition of needs: Return to [facility]  Patient discussed in LOS meeting as well as with Engineer, site regarding patient returning to memory care facility.  AD reports that if sitters can be arranged, patient can return to memory care unit. Facility has been having difficulty reaching POA/sister, thus LCSW has another number to give and call placed to [facility].  Regional DON reviewing case as well, but per AD and Deanna with [facility], this should not be a problem with return. LCSW called Alinda, Administrator with [facility] and gave alternative phone number for Roselie Awkward as the facility has had trouble reaching sister.  LCSW also had another company/memory care facility come and see patient in the event patient is truly unable to return. Dorian Pod has been given referral and has laid eyes on patient and will follow up with possible bed at [facility] if needed. She too will follow up with phone call.  Plan:  Awaiting call from Administrator and [facility] regarding return with sitters today. Family aware of barriers and plans.  LCSW has completed calls and referrals for sitters as required for patient to return to [facility]. LCSW has coordinated care with [facility] regarding setting up three shifts.  Patient has 2nd and 3rd shift set up starting at 3pm tomorrow 10/5.   Company: [facility]  [phone]  LCSW called [facility] (spoke with [facility]) regarding plans for patient to continue services for hospice at facility. She confirms and plans to readmit him once he physically leaves the hospital.   Family participated throughout assessment with [facility] with LCSW helping with phone and hearing (as both POA and her husband are [facility] and could not understand company via phone).  LCSW will update CM and ensure hospice services will resume at discharge. [facility] will complete assessment at facility and  continue arranging schedule for sitters 24/7 at [facility].  Treatment team is aware of barriers at this time. Call placed to AD to notify of plans as he has also been involved. Unable to reach at this time.   Plan: Anticipate discharge back to ALF on Friday. Unsure of transport, but will see if facility can provide transport or default to EMS. [place] will call in the morning regarding any additional needs and schedule for patient. Hospice: [facility] will also coordinate care with LCSW resume services.   Lane Hacker, MSW Clinical Social Work: [employer] Coverage for :  [phone]

## 2017-02-02 NOTE — Care Management Important Message (Signed)
Important Message  Patient Details IM Letter given to Suzanne/Case Manager to present to Patient Name: TEOFILO LUPINACCI MRN: [id number] Date of Birth: 1925/01/23   Medicare Important Message Given:  Yes    Kerin Salen 02/02/2017, 11:45 AMImportant Message  Patient Details  Name: DAVYN MORANDI MRN: [id number] Date of Birth: 1924/08/14   Medicare Important Message Given:  Yes    Kerin Salen 02/02/2017, 11:45 AM

## 2017-02-02 NOTE — Progress Notes (Signed)
Patient seen and examined. Patient was supposed to discharge back to his nursing facility yesterday. However, that did not occur. Social worker and Tourist information centre manager following and facilitating discharge. Patient remains medically stable. Discussed with the patient's nurse. Patient more cooperative and is eating his meals.  Remains confused , which is his usual baseline. Denies any complaints.  Vital signs reviewed. Lungs are clear to auscultation. S1, S2 is normal regular Abdomen is soft Remains disoriented due to dementia. No focal neurological deficits.  Labs were repeated this morning and reviewed. No significant abnormality identified.  Overall, patient remains stable. He is being treated for UTI. We continue to await placement. No changes to discharge summary.  Bonnielee Haff

## 2017-02-03 LAB — GLUCOSE, CAPILLARY
GLUCOSE-CAPILLARY: 147 mg/dL — AB (ref 65–99)
GLUCOSE-CAPILLARY: 192 mg/dL — AB (ref 65–99)

## 2017-02-03 NOTE — NC FL2 (Signed)
[place] LEVEL OF CARE SCREENING TOOL     IDENTIFICATION  Patient Name: Joe [name] Birthdate: 1924/10/18 Sex: male Admission Date (Current Location): 01/27/2017  [facility] and [place] Number:  Herbalist and Address:  [facility],  [place] [address], [place]      Provider Number: [id number]  Attending Physician Name and Address:  Bonnielee Haff, MD  Relative Name and Phone Number:       Current Level of Care: Hospital Recommended Level of Care: Memory Care Prior Approval Number:    Date Approved/Denied:   PASRR Number:    Discharge Plan: Other (Comment) (Return to [facility])    Current Diagnoses: Patient Active Problem List   Diagnosis Date Noted  . Protein-calorie malnutrition, severe 01/30/2017  . Sepsis secondary to UTI ([place]) 01/27/2017  . Adjustment disorder with mixed anxiety and depressed mood 11/13/2014  . Cognitive deficits 11/13/2014  . Syncope and collapse 11/10/2014  . Orthostatic hypotension 11/10/2014  . Hypothyroidism 08/28/2014  . Essential hypertension 08/28/2014  . Atrial fibrillation, new onset ([place]) 08/28/2014  . Dizziness 08/28/2014  . BPH (benign prostatic hypertrophy) 08/28/2014  . Diabetes mellitus with no complication ([place]) 95/12/3265  . Risky sexual behavior 08/28/2014  . A-fib ([place]) 08/28/2014  . Hypothyroid 09/10/2013  . BPH (benign prostatic hyperplasia) 09/10/2013    Orientation RESPIRATION BLADDER Height & Weight     Self  Normal Incontinent Weight: 132 lb 4.4 oz (60 kg) Height:  5\' 10"  (177.8 cm)  BEHAVIORAL SYMPTOMS/MOOD NEUROLOGICAL BOWEL NUTRITION STATUS  [place]   Incontinent Diet (Resume Diet)  AMBULATORY STATUS COMMUNICATION OF NEEDS Skin   Supervision Verbally Skin abrasions, Bruising, Other (Comment) ([place])                       Personal Care Assistance Level of Assistance  Bathing, Feeding, Dressing Bathing Assistance: Maximum assistance Feeding  assistance: Limited assistance Dressing Assistance: Maximum assistance     Functional Limitations Info             SPECIAL CARE FACTORS FREQUENCY                       Contractures Contractures Info: Not present    Additional Factors Info  Code Status, Allergies, Isolation Precautions Code Status Info: Fullcode Allergies Info: NKA     Isolation Precautions Info: On contact:MRSA     Current Medications (02/03/2017):  This is the current hospital active medication list Current Facility-Administered Medications  Medication Dose Route Frequency Provider Last Rate Last Dose  . acetaminophen (TYLENOL) tablet 650 mg  650 mg Oral Q6H PRN Donne Hazel, MD   650 mg at 02/02/17 2029   Or  . acetaminophen (TYLENOL) suppository 650 mg  650 mg Rectal Q6H PRN Donne Hazel, MD      . feeding supplement (ENSURE ENLIVE) (ENSURE ENLIVE) liquid 237 mL  237 mL Oral TID BM Robbie Lis, MD   237 mL at 02/03/17 0918  . haloperidol (HALDOL) tablet 0.5 mg  0.5 mg Oral BID Robbie Lis, MD   0.5 mg at 02/03/17 0636  . insulin aspart (novoLOG) injection 0-15 Units  0-15 Units Subcutaneous TID WC Donne Hazel, MD   2 Units at 02/03/17 [phone]  . insulin aspart (novoLOG) injection 0-5 Units  0-5 Units Subcutaneous QHS Donne Hazel, MD   2 Units at 01/31/17 2159  . levothyroxine (SYNTHROID, LEVOTHROID) tablet 112 mcg  112 mcg Oral QAC breakfast Donne Hazel, MD   112 mcg at 02/03/17 1030  . LORazepam (ATIVAN) tablet 0.5 mg  0.5 mg Oral Q6H PRN Bonnielee Haff, MD   0.5 mg at 02/02/17 2148  . metoprolol tartrate (LOPRESSOR) tablet 12.5 mg  12.5 mg Oral BID Donne Hazel, MD   12.5 mg at 02/03/17 1314  . QUEtiapine (SEROQUEL) tablet 100 mg  100 mg Oral QHS Donne Hazel, MD   100 mg at 02/02/17 2023   Facility-Administered Medications Ordered in Other Encounters  Medication Dose Route Frequency Provider Last Rate Last Dose  . cyclopentolate (CYCLODRYL,CYCLOGYL) 1 % ophthalmic  solution 1 drop  1 drop Left Eye PRN Rankin, Clent Demark, MD      . gatifloxacin (ZYMAXID) 0.5 % ophthalmic drops 1 drop  1 drop Left Eye PRN Rankin, Clent Demark, MD         Discharge Medications: Please see discharge summary for a list of discharge medications.  Relevant Imaging Results:  Relevant Lab Results:   Additional Information resume hospice services at the facility.  Lia Hopping, LCSW

## 2017-02-03 NOTE — Discharge Summary (Signed)
[facility]  Physician Discharge Summary   Patient ID: Joe [name] MRN: [id number] DOB/AGE: [birth year] 81 y.o.  Admit date: 01/27/2017 Discharge date: 02/03/2017  PCP: Anda Kraft, MD  DISCHARGE DIAGNOSES:  Principal Problem:   Sepsis secondary to UTI [facility]) Active Problems:   Essential hypertension   Diabetes mellitus with no complication ([place])   A-fib ([place])   Syncope and collapse   Orthostatic hypotension   Protein-calorie malnutrition, severe   RECOMMENDATIONS FOR OUTPATIENT FOLLOW UP: 1. Continue Cipro for 4 days on discharge 2. Hospice to continue to follow the patient.  DISCHARGE CONDITION: stable  Diet recommendation: Soft Diet with thin liquids. Follow-up full aspiration precautions.  Filed Weights   01/28/17 0300  Weight: 60 kg (132 lb 4.4 oz)    INITIAL HISTORY: 83 year old male with history of advanced dementia, atrial fibrillation, BPH, hypertension who presented to ED from the facility because of multiple falls. On admission he was slightly hypothermic with temperature of 97.43F. His blood work was significant for hemoglobin of 9.5, glucose 150. Urinalysis was suggestive of UTI and he was started on empiric Rocephin while awaiting culture results.   HOSPITAL COURSE:   Sepsis secondary to UTI Sepsis criteria met on admission with hypothermia, tachycardia, hypotension. Source of infection is UTI, urinalysis showed large leukocytes. Blood cultures negative. Lactic acid was within normal limits. Changed to ciprofloxacin and will continue for 4 days on discharge.  Essential hypertension Continue metoprolol  Diabetes mellitus with no complication Continue sliding scale insulin and Tradjenta   A-fib, chronic CHA2DS2-VASc Score is 3. Not on anticoagulation due to the advanced dementia. Rate controlled with metoprolol.  Dementia with behavioral disturbance Continue Zoloft, Seroquel, Haldol. Ativan as needed.  Hypothyroidism Continue  Synthroid  Severe protein calorie malnutrition In the context of chronic illness.   Overall, stable. Patient is stable for discharge today. Hospice to follow patient at the facility. Patient has been waiting 3 days for him to be accepted at the facility. No new issues noted in these last 3 days.    PERTINENT LABS:  The results of significant diagnostics from this hospitalization (including imaging, microbiology, ancillary and laboratory) are listed below for reference.    Microbiology: Recent Results (from the past 240 hour(s))  Culture, blood (x 2)     Status: None   Collection Time: 01/27/17  5:19 PM  Result Value Ref Range Status   Specimen Description BLOOD RIGHT FOREARM  Final   Special Requests   Final    BOTTLES DRAWN AEROBIC ONLY Blood Culture adequate volume   Culture   Final    NO GROWTH 5 DAYS Performed at [facility], [address] [place], [place] [address]    Report Status 02/01/2017 FINAL  Final  Culture, blood (x 2)     Status: None   Collection Time: 01/27/17  9:51 PM  Result Value Ref Range Status   Specimen Description BLOOD LEFT FOREARM  Final   Special Requests   Final    BOTTLES DRAWN AEROBIC AND ANAEROBIC Blood Culture adequate volume   Culture   Final    NO GROWTH 5 DAYS Performed at [facility], [place] [address] [place],  [address]    Report Status 02/02/2017 FINAL  Final  MRSA PCR Screening     Status: Abnormal   Collection Time: 01/28/17 12:26 AM  Result Value Ref Range Status   MRSA by PCR POSITIVE (A) NEGATIVE Final    Comment:  The GeneXpert MRSA Assay (FDA approved for NASAL specimens only), is one component of a comprehensive MRSA colonization surveillance program. It is not intended to diagnose MRSA infection nor to guide or monitor treatment for MRSA infections. RESULT CALLED TO, READ BACK BY AND VERIFIED WITH: J SCOTTON AT 0236 ON 09.29.2018 BY NBROOKS      Labs: Basic Metabolic Panel:  Recent  Labs Lab 01/27/17 1337 01/28/17 0422 01/29/17 0526 02/02/17 1035  NA 137 142 141 139  K 3.9 3.3* 3.5 3.8  CL 102 111 111 105  CO2  --  _0 GLUCOSE 150* 81 111* 185*  BUN 25* 22* 12 9  CREATININE 1.00 1.08 0.88 0.84  CALCIUM  --  7.6* 7.6* 7.9*   Liver Function Tests:  Recent Labs Lab 01/28/17 0422  AST 15  ALT 10*  ALKPHOS 90  BILITOT 0.5  PROT 5.0*  ALBUMIN 2.1*   CBC:  Recent Labs Lab 01/27/17 1337 01/27/17 1440 01/28/17 0422 01/29/17 0526 02/02/17 1035  WBC  --  9.1 7.8 8.8 8.9  NEUTROABS  --  7.0  --   --   --   HGB 9.5* 9.8* 8.4* 9.0* 9.3*  HCT 28.0* 28.7* 24.7* 26.6* 27.4*  MCV  --  91.7 91.8 92.7 91.9  PLT  --  251 255 275 304   CBG:  Recent Labs Lab 02/02/17 0747 02/02/17 1139 02/02/17 1648 02/02/17 2117 02/03/17 0741  GLUCAP 174* 167* 173* 152* 147*     IMAGING STUDIES Ct Head Wo Contrast  Result Date: 01/25/2017 CLINICAL DATA:  Post unwitnessed fall with facial hematoma and scalp injury. EXAM: CT HEAD WITHOUT CONTRAST CT MAXILLOFACIAL WITHOUT CONTRAST CT CERVICAL SPINE WITHOUT CONTRAST TECHNIQUE: Multidetector CT imaging of the head, cervical spine, and maxillofacial structures were performed using the standard protocol without intravenous contrast. Multiplanar CT image reconstructions of the cervical spine and maxillofacial structures were also generated. COMPARISON:  Head and cervical spine CT 12/06/2016, additional priors. FINDINGS: CT HEAD FINDINGS Brain: Stable degree of atrophy and chronic small vessel ischemia. No intracranial hemorrhage, mass effect, or midline shift. No hydrocephalus. The basilar cisterns are patent. No evidence of territorial infarct or acute ischemia. No extra-axial or intracranial fluid collection. Vascular: Atherosclerosis of skullbase vasculature without hyperdense vessel or abnormal calcification. Skull: No fracture or focal lesion. Other: Right frontal skin staples with hematoma. Scattered air in the scalp  about the vertex. CT MAXILLOFACIAL FINDINGS Osseous: Nasal bone, zygomatic arches and mandibles are intact. The temporomandibular joints are congruent with mild degenerative change. There are multiple missing teeth with poor dentition and dental caries. Mild motion artifact limits assessment. Orbits: Both orbits and globes are intact. No orbital fracture. Bilateral cataract resection. Sinuses: Mild mucous retention cyst in the left maxillary sinus, otherwise clear. Soft tissues: Right periorbital soft tissue hematoma. No radiopaque foreign body. CT CERVICAL SPINE FINDINGS Alignment: Unchanged from prior exam. Mild degenerative anterolisthesis of C6 on C7 is stable. Reversal of normal cervical lordosis, unchanged. Skull base and vertebrae: No acute fracture. Remote C7 and T2 compression fractures, unchanged. Skullbase and dens are intact. Soft tissues and spinal canal: No prevertebral fluid or swelling. No visible canal hematoma. Disc levels: Diffuse disc space narrowing and endplate spurring, most prominent at C3-C4. Multilevel facet arthropathy. Degenerative changes are stable from prior. Upper chest: Negative. Other: Carotid calcifications. IMPRESSION: 1. Scalp injury with skin staples in place and hematoma. No acute intracranial abnormality. No skull fracture. Stable atrophy and chronic small vessel ischemia. 2. Right  periorbital soft tissue edema. No acute facial bone fracture. 3. No acute fracture or subluxation of the cervical spine. Degenerative changes are stable from prior. 4. Carotid and skullbase atherosclerosis. Electronically Signed   By: Jeb Levering M.D.   On: 01/25/2017 05:50   Ct Cervical Spine Wo Contrast  Result Date: 01/25/2017 CLINICAL DATA:  Post unwitnessed fall with facial hematoma and scalp injury. EXAM: CT HEAD WITHOUT CONTRAST CT MAXILLOFACIAL WITHOUT CONTRAST CT CERVICAL SPINE WITHOUT CONTRAST TECHNIQUE: Multidetector CT imaging of the head, cervical spine, and maxillofacial  structures were performed using the standard protocol without intravenous contrast. Multiplanar CT image reconstructions of the cervical spine and maxillofacial structures were also generated. COMPARISON:  Head and cervical spine CT 12/06/2016, additional priors. FINDINGS: CT HEAD FINDINGS Brain: Stable degree of atrophy and chronic small vessel ischemia. No intracranial hemorrhage, mass effect, or midline shift. No hydrocephalus. The basilar cisterns are patent. No evidence of territorial infarct or acute ischemia. No extra-axial or intracranial fluid collection. Vascular: Atherosclerosis of skullbase vasculature without hyperdense vessel or abnormal calcification. Skull: No fracture or focal lesion. Other: Right frontal skin staples with hematoma. Scattered air in the scalp about the vertex. CT MAXILLOFACIAL FINDINGS Osseous: Nasal bone, zygomatic arches and mandibles are intact. The temporomandibular joints are congruent with mild degenerative change. There are multiple missing teeth with poor dentition and dental caries. Mild motion artifact limits assessment. Orbits: Both orbits and globes are intact. No orbital fracture. Bilateral cataract resection. Sinuses: Mild mucous retention cyst in the left maxillary sinus, otherwise clear. Soft tissues: Right periorbital soft tissue hematoma. No radiopaque foreign body. CT CERVICAL SPINE FINDINGS Alignment: Unchanged from prior exam. Mild degenerative anterolisthesis of C6 on C7 is stable. Reversal of normal cervical lordosis, unchanged. Skull base and vertebrae: No acute fracture. Remote C7 and T2 compression fractures, unchanged. Skullbase and dens are intact. Soft tissues and spinal canal: No prevertebral fluid or swelling. No visible canal hematoma. Disc levels: Diffuse disc space narrowing and endplate spurring, most prominent at C3-C4. Multilevel facet arthropathy. Degenerative changes are stable from prior. Upper chest: Negative. Other: Carotid calcifications.  IMPRESSION: 1. Scalp injury with skin staples in place and hematoma. No acute intracranial abnormality. No skull fracture. Stable atrophy and chronic small vessel ischemia. 2. Right periorbital soft tissue edema. No acute facial bone fracture. 3. No acute fracture or subluxation of the cervical spine. Degenerative changes are stable from prior. 4. Carotid and skullbase atherosclerosis. Electronically Signed   By: Jeb Levering M.D.   On: 01/25/2017 05:50   Ct Maxillofacial Wo Contrast  Result Date: 01/25/2017 CLINICAL DATA:  Post unwitnessed fall with facial hematoma and scalp injury. EXAM: CT HEAD WITHOUT CONTRAST CT MAXILLOFACIAL WITHOUT CONTRAST CT CERVICAL SPINE WITHOUT CONTRAST TECHNIQUE: Multidetector CT imaging of the head, cervical spine, and maxillofacial structures were performed using the standard protocol without intravenous contrast. Multiplanar CT image reconstructions of the cervical spine and maxillofacial structures were also generated. COMPARISON:  Head and cervical spine CT 12/06/2016, additional priors. FINDINGS: CT HEAD FINDINGS Brain: Stable degree of atrophy and chronic small vessel ischemia. No intracranial hemorrhage, mass effect, or midline shift. No hydrocephalus. The basilar cisterns are patent. No evidence of territorial infarct or acute ischemia. No extra-axial or intracranial fluid collection. Vascular: Atherosclerosis of skullbase vasculature without hyperdense vessel or abnormal calcification. Skull: No fracture or focal lesion. Other: Right frontal skin staples with hematoma. Scattered air in the scalp about the vertex. CT MAXILLOFACIAL FINDINGS Osseous: Nasal bone, zygomatic arches and mandibles are intact.  The temporomandibular joints are congruent with mild degenerative change. There are multiple missing teeth with poor dentition and dental caries. Mild motion artifact limits assessment. Orbits: Both orbits and globes are intact. No orbital fracture. Bilateral cataract  resection. Sinuses: Mild mucous retention cyst in the left maxillary sinus, otherwise clear. Soft tissues: Right periorbital soft tissue hematoma. No radiopaque foreign body. CT CERVICAL SPINE FINDINGS Alignment: Unchanged from prior exam. Mild degenerative anterolisthesis of C6 on C7 is stable. Reversal of normal cervical lordosis, unchanged. Skull base and vertebrae: No acute fracture. Remote C7 and T2 compression fractures, unchanged. Skullbase and dens are intact. Soft tissues and spinal canal: No prevertebral fluid or swelling. No visible canal hematoma. Disc levels: Diffuse disc space narrowing and endplate spurring, most prominent at C3-C4. Multilevel facet arthropathy. Degenerative changes are stable from prior. Upper chest: Negative. Other: Carotid calcifications. IMPRESSION: 1. Scalp injury with skin staples in place and hematoma. No acute intracranial abnormality. No skull fracture. Stable atrophy and chronic small vessel ischemia. 2. Right periorbital soft tissue edema. No acute facial bone fracture. 3. No acute fracture or subluxation of the cervical spine. Degenerative changes are stable from prior. 4. Carotid and skullbase atherosclerosis. Electronically Signed   By: Jeb Levering M.D.   On: 01/25/2017 05:50    DISCHARGE EXAMINATION: Vitals:   02/02/17 2023 02/02/17 2056 02/03/17 0630 02/03/17 0701  BP: (!) 137/91 118/80 (!) 168/112 (!) 139/92  Pulse: (!) 107 99 (!) 106 91  Resp:  14 16   Temp:  97.7 F (36.5 C) (!) 96.8 F (36 C)   TempSrc:   Oral   SpO2:  98% 97%   Weight:      Height:       General appearance: alert, distracted and no distress. Not fully cooperative at times. Resp: clear to auscultation bilaterally Cardio: regular rate and rhythm, S1, S2 normal, no murmur, click, rub or gallop GI: soft, non-tender; bowel sounds normal; no masses,  no organomegaly  DISPOSITION: [place]  Discharge Instructions    Call MD for:  persistant nausea and vomiting     Complete by:  As directed    Call MD for:  redness, tenderness, or signs of infection (pain, swelling, redness, odor or green/yellow discharge around incision site)    Complete by:  As directed    Call MD for:  severe uncontrolled pain    Complete by:  As directed    Diet - low sodium heart healthy    Complete by:  As directed    Increase activity slowly    Complete by:  As directed       ALLERGIES:  Allergies  Allergen Reactions  . No Known Allergies      Current Discharge Medication List    START taking these medications   Details  ciprofloxacin (CIPRO) 500 MG tablet Take 1 tablet (500 mg total) by mouth 2 (two) times daily. Qty: 10 tablet, Refills: 0    insulin aspart (NOVOLOG) 100 UNIT/ML injection Inject 0-15 Units into the skin 3 (three) times daily with meals. Qty: 10 mL, Refills: 11      CONTINUE these medications which have CHANGED   Details  haloperidol (HALDOL) 0.5 MG tablet Take 1 tablet (0.5 mg total) by mouth 2 (two) times daily. Qty: 60 tablet, Refills: 0    LORazepam (ATIVAN) 0.5 MG tablet Take 1 tablet (0.5 mg total) by mouth daily. May take 1 mg q4hprn for agitation Qty: 30 tablet, Refills: 0  CONTINUE these medications which have NOT CHANGED   Details  acetaminophen (TYLENOL) 325 MG tablet Take 650 mg by mouth every 6 (six) hours as needed for mild pain.    ketoconazole (NIZORAL) 2 % shampoo Apply 1 application topically 3 (three) times a week.    levothyroxine (SYNTHROID, LEVOTHROID) 112 MCG tablet Take 112 mcg by mouth daily before breakfast.    metoprolol tartrate (LOPRESSOR) 25 MG tablet Take 12.5 mg by mouth daily.     QUEtiapine (SEROQUEL) 100 MG tablet Take 100 mg by mouth at bedtime.     tamsulosin (FLOMAX) 0.4 MG CAPS capsule Take 0.4 mg by mouth daily.    TRADJENTA 5 MG TABS tablet Take 5 mg by mouth daily.    Vitamin D, Ergocalciferol, (DRISDOL) 50000 units CAPS capsule Take 50,000 Units by mouth every 7 (seven) days.        STOP taking these medications     levofloxacin (LEVAQUIN) 500 MG tablet           Contact information for follow-up providers    Anda Kraft, MD. Schedule an appointment as soon as possible for a visit in 1 week(s).   Specialty:  Endocrinology Contact information: [address] [place] [address] [phone]            Contact information for after-discharge care    Destination    [facility] Follow up.   Specialty:  [place] Contact information: [place] [phone]                  TOTAL DISCHARGE TIME: 61 minutes  [facility]  [facility] Pager [phone]  02/03/2017, 10:32 AM

## 2017-02-03 NOTE — Progress Notes (Addendum)
9:03am CSW received call from [facility] w/ [place], she reports [facility] will order equipment for patient today. A hospital bed will be delivered to [facility] this afternoon.  CSW called Vaughan Basta, to determine pt. transportation arrangement, waiting for return phone call.   12:15pm.  CSW confirmed with Vaughan Basta, patient will transport by [facility] back to facility. [facility] informed her the patient bed will arrive between 12:30pm and 4:00pm. She will inform CSW when bed is there. CSW scheduled transport for 4:00 pick up. Med necessity complete. CSW informed patient sister and brother in [employer] [place].   12:50pm D/C Summary and updated Fl2 routed to [phone] via Epic because facility cannot access [facility] today.   2:59pm  CSW checked in w/ linda. Patient has not arrived yet. Vaughan Basta is agreeable for the patient to be picked up at four "even if the bed has not arrived."  CSW received call from [place], she has arranged for the sitter to be on site at 4:30pm.   CSW updated Nurse AL,provided number to give report.

## 2017-02-03 NOTE — Progress Notes (Signed)
Report called to [facility] at [facility].  AVS and discharge supplied by Education officer, museum given to [employer].  Patient escorted via stretcher to facility by paramedics.

## 2017-02-03 NOTE — Progress Notes (Signed)
[facility] Liaison: RN visit  Notified by Jarrett Soho CSW, of patient/family request for [facility] services at [facility] after discharge. Chart and patient information under review by [facility] physician. Hospice eligibility pending at this time.  Writer spoke with family at bedside and [employer] by phone, to initiate education related to hospice philosophy, services and team approach to care.  Family  verbalized understanding of information given.  Per discussion, plan is for discharge by [facility] on 02/03/2017.  Patient will need prescriptions for discharge comfort medications.  DME needs have been discussed, patient currently has the following equipment in the facility:  none.  Patient/family requests the following DME for delivery to the [facility]: hospital bed, OBT and W/C.  [facility] equipment manager has been notified and will contact [place] to arrange delivery to the facility.  The address has been verified and is correct in the chart. [facility]  Is the  contact to arrange time of delivery.  [facility]erral [facility] aware of the above.  Completed discharge summary will need to be faxed to [facility] at [phone] when final.  Please notify [facility] when patient is ready to leave the unit at discharge. (Call [phone] or [phone] after 5pm.)  [facility] information and contact numbers given to family  during this visit.  Above information shared with Elmyra Ricks CSW.  Please call with any Hospice related questions.  Thank you for this referral.  Farrel Gordon, RN, [facility] Liaison [phone]   All hospital liaisons are on [place].

## 2017-03-02 ENCOUNTER — Emergency Department (HOSPITAL_COMMUNITY)
Admission: EM | Admit: 2017-03-02 | Discharge: 2017-03-02 | Disposition: A | Discharge status: Home / Self Care | Attending: Emergency Medicine | Admitting: Emergency Medicine

## 2017-03-02 ENCOUNTER — Encounter (HOSPITAL_COMMUNITY): Payer: Self-pay

## 2017-03-02 ENCOUNTER — Emergency Department (HOSPITAL_COMMUNITY)

## 2017-03-02 DIAGNOSIS — Y939 Activity, unspecified: Secondary | ICD-10-CM | POA: Diagnosis not present

## 2017-03-02 DIAGNOSIS — I10 Essential (primary) hypertension: Secondary | ICD-10-CM | POA: Insufficient documentation

## 2017-03-02 DIAGNOSIS — S51012A Laceration without foreign body of left elbow, initial encounter: Secondary | ICD-10-CM | POA: Diagnosis not present

## 2017-03-02 DIAGNOSIS — Z7984 Long term (current) use of oral hypoglycemic drugs: Secondary | ICD-10-CM | POA: Insufficient documentation

## 2017-03-02 DIAGNOSIS — E119 Type 2 diabetes mellitus without complications: Secondary | ICD-10-CM | POA: Insufficient documentation

## 2017-03-02 DIAGNOSIS — Y92199 Unspecified place in other specified residential institution as the place of occurrence of the external cause: Secondary | ICD-10-CM | POA: Diagnosis not present

## 2017-03-02 DIAGNOSIS — W19XXXA Unspecified fall, initial encounter: Secondary | ICD-10-CM | POA: Diagnosis not present

## 2017-03-02 DIAGNOSIS — Y999 Unspecified external cause status: Secondary | ICD-10-CM | POA: Insufficient documentation

## 2017-03-02 DIAGNOSIS — Z79899 Other long term (current) drug therapy: Secondary | ICD-10-CM | POA: Diagnosis not present

## 2017-03-02 DIAGNOSIS — E039 Hypothyroidism, unspecified: Secondary | ICD-10-CM | POA: Insufficient documentation

## 2017-03-02 DIAGNOSIS — T148XXA Other injury of unspecified body region, initial encounter: Secondary | ICD-10-CM | POA: Diagnosis not present

## 2017-03-02 DIAGNOSIS — F0390 Unspecified dementia without behavioral disturbance: Secondary | ICD-10-CM | POA: Insufficient documentation

## 2017-03-02 NOTE — Progress Notes (Signed)
[facility] Liaison:  RN visit  Saw patient at bedside in the Emergency Room.  Patient was asleep and NAD.  Patient does have kerlix to L elbow covering the wounded area.  Per RN, patient will be getting the laceration closed with dermabond and will be returned to the facility.   I did clarify with [place] that patient is to go back to facility by [facility], as he is currently a hospice patient.    Thank you.  Edyth Gunnels, RN, BSN [facility] Liaison [phone]  All hospital liaisons are now on [place].

## 2017-03-02 NOTE — ED Provider Notes (Signed)
[facility] DEPT Provider Note   CSN: [id number] Arrival date & time: 03/02/17  2202     History   Chief Complaint Chief Complaint  Patient presents with  . Laceration   Level 5 caveat: Dementia  HPI Joe [name] is a 83 y.o. male.  HPI Patient presents to the emergency department from a memory care unit where he had a witnessed fall.  His back was up against a wall and he slid down landing elbow.  He presents with a laceration to his left elbow.  No head injury.  No obvious deformity of his lower extremities.  Patient reports his left elbow hurts.  He is unable to provide any other additional information given his advanced dementia.  Staff from the facility is with him who witnessed the fall.  No recent illness.  No recent vomiting or fevers.  No change in his mental status today.   Past Medical History:  Diagnosis Date  . Anxiety   . Atrial fibrillation with RVR ([place]) 08/28/2014   [name] 08/28/2014  . BPH (benign prostatic hypertrophy)    [name] 08/28/2014  . Dementia   . Diabetes mellitus without complication ([place])   . Hypertension    [name] 08/28/2014  . Hypothyroidism    [name] 08/28/2014  . Insomnia   . Thyroid disease     Patient Active Problem List   Diagnosis Date Noted  . Protein-calorie malnutrition, severe 01/30/2017  . Sepsis secondary to UTI ([place]) 01/27/2017  . Adjustment disorder with mixed anxiety and depressed mood 11/13/2014  . Cognitive deficits 11/13/2014  . Syncope and collapse 11/10/2014  . Orthostatic hypotension 11/10/2014  . Hypothyroidism 08/28/2014  . Essential hypertension 08/28/2014  . Atrial fibrillation, new onset ([place]) 08/28/2014  . Dizziness 08/28/2014  . BPH (benign prostatic hypertrophy) 08/28/2014  . Diabetes mellitus with no complication ([place]) 54/27/0623  . Risky sexual behavior 08/28/2014  . A-fib ([place]) 08/28/2014  . Hypothyroid 09/10/2013  . BPH (benign prostatic hyperplasia) 09/10/2013     Past Surgical History:  Procedure Laterality Date  . PARS PLANA VITRECTOMY Left 03/30/2013   Procedure: PARS PLANA VITRECTOMY 25 GAUGE FOR ENDOPHTHALMITIS;  Surgeon: Hurman Horn, MD;  Location: [facility];  Service: Ophthalmology;  Laterality: Left;  LOCAL RETROBULBAR,,  USE MARCAINE 0.5%       Home Medications    Prior to Admission medications   Medication Sig Start Date End Date Taking? Authorizing Provider  acetaminophen (TYLENOL) 325 MG tablet Take 650 mg by mouth every 6 (six) hours as needed for mild pain.   Yes [provider]  guaiFENesin (MUCINEX) 600 MG 12 hr tablet Take 600 mg by mouth 2 (two) times daily.   Yes [provider]  haloperidol (HALDOL) 0.5 MG tablet Take 1 tablet (0.5 mg total) by mouth 2 (two) times daily. 01/31/17  Yes Robbie Lis, MD  ketoconazole (NIZORAL) 2 % shampoo Apply 1 application topically 3 (three) times a week.   Yes [provider]  levothyroxine (SYNTHROID, LEVOTHROID) 112 MCG tablet Take 112 mcg by mouth daily before breakfast.   Yes [provider]  LORazepam (ATIVAN) 0.5 MG tablet Take 1 tablet (0.5 mg total) by mouth daily. May take 1 mg q4hprn for agitation Patient taking differently: Take 0.5 mg by mouth daily as needed for anxiety. May take 1 mg q4hprn for agitation 01/31/17  Yes Robbie Lis, MD  metFORMIN (GLUCOPHAGE) 500 MG tablet Take 500 mg by mouth 2 (two) times daily with  a meal.   Yes [provider]  metoprolol tartrate (LOPRESSOR) 25 MG tablet Take 12.5 mg by mouth daily.    Yes [provider]  QUEtiapine (SEROQUEL) 100 MG tablet Take 100 mg by mouth at bedtime.    Yes [provider]  senna (SENOKOT) 8.6 MG TABS tablet Take 2 tablets by mouth daily as needed for mild constipation.   Yes [provider]  tamsulosin (FLOMAX) 0.4 MG CAPS capsule Take 0.4 mg by mouth daily.   Yes [provider]  insulin aspart (NOVOLOG) 100 UNIT/ML injection Inject  0-15 Units into the skin 3 (three) times daily with meals. Patient not taking: Reported on 03/02/2017 01/31/17   Robbie Lis, MD    Family History Family History  Problem Relation Age of Onset  . Hyperlipidemia Sister     Social History Social History  Substance Use Topics  . Smoking status: Never Smoker  . Smokeless tobacco: Never Used  . Alcohol use No     Allergies   No known allergies   Review of Systems Review of Systems  All other systems reviewed and are negative.    Physical Exam Updated Vital Signs BP 110/68   Pulse 87   Temp 98 F (36.7 C) (Oral)   Resp 18   Wt 60 kg (132 lb 4.4 oz)   SpO2 98%   BMI 18.98 kg/m   Physical Exam  Constitutional: He is oriented to person, place, and time. He appears well-developed and well-nourished.  HENT:  Head: Normocephalic.  Eyes: EOM are normal.  Neck: Normal range of motion.  Cardiovascular: Normal rate.   Pulmonary/Chest: Effort normal and breath sounds normal. He exhibits no tenderness.  Abdominal: Soft. He exhibits no distension. There is no tenderness.  Musculoskeletal:  Full range of motion of bilateral shoulders, elbows and wrists. Full range of motion of bilateral hips, knees and ankles.  Nonbleeding laceration of his left posterior elbow.   Neurological: He is alert and oriented to person, place, and time.  Psychiatric: He has a normal mood and affect.  Nursing note and vitals reviewed.    ED Treatments / Results  Labs (all labs ordered are listed, but only abnormal results are displayed) Labs Reviewed - No data to display  EKG  EKG Interpretation None       Radiology Dg Elbow Complete Left  Result Date: 03/02/2017 CLINICAL DATA:  Left elbow laceration. The mechanism of trauma is un clear. EXAM: LEFT ELBOW - COMPLETE 3+ VIEW COMPARISON:  Left elbow series of August 07, 2016 FINDINGS: The bones are subjectively adequately mineralized. There is no acute fracture nor dislocation. There is a  tiny olecranon spur. No joint effusion is observed. IMPRESSION: There is no acute bony abnormality of the left elbow. Electronically Signed   By: David  Martinique M.D.   On: 03/02/2017 10:32    Procedures .[place] KitchenLaceration Repair Performed by: Jola Schmidt Authorized by: Jola Schmidt      LACERATION REPAIR Performed by: Hoy Morn Consent: Verbal consent obtained. Risks and benefits: risks, benefits and alternatives were discussed Patient identity confirmed: provided demographic data Time out performed prior to procedure Prepped and Draped in normal sterile fashion Wound explored Laceration Location: left elbow Laceration Length: 4cm No Foreign Bodies seen or palpated Anesthesia: none Irrigation method: syringe Amount of cleaning: standard Skin closure: dermabond, steristrips Number of sutures or staples: none Technique: see above Patient tolerance: Patient tolerated the procedure well with no immediate complications.   Medications Ordered in  ED Medications - No data to display   Initial Impression / Assessment and Plan / ED Course  I have reviewed the triage vital signs and the nursing notes.  Pertinent labs & imaging results that were available during my care of the patient were reviewed by me and considered in my medical decision making (see chart for details).     Full range of motion of major joints.  No signs of head trauma.  Laceration of the left elbow was repaired to the best my ability given his advanced dementia and uncooperativeness.  Final Clinical Impressions(s) / ED Diagnoses   Final diagnoses:  Elbow laceration, left, initial encounter    New Prescriptions New Prescriptions   No medications on file     Jola Schmidt, MD 03/02/17 1234

## 2017-03-02 NOTE — Discharge Instructions (Signed)
Please contact [facility] for Acute care needs for any new or worsening symptoms.  If bleeding reoccurs, please hold pressure for 10 minutes, then recheck

## 2017-03-02 NOTE — ED Notes (Signed)
Bed: BM21 Expected date:  Expected time:  Means of arrival:  Comments: EMS: 83 yo laceration

## 2017-03-02 NOTE — ED Triage Notes (Signed)
Patient brought in by EMS from [facility] (off of [address]). Patient is hospice patient. Patient has laceration to left elbow- bleeding controlled in triage. Per report, facility is unsure of the origin of the laceration. Per report, patient is not on blood thinners. Patient does have a history of dementia and has limited verbal abilities. Patient is accompanied by a CNA from his facility.

## 2017-03-28 DIAGNOSIS — R1310 Dysphagia, unspecified: Secondary | ICD-10-CM | POA: Diagnosis not present

## 2017-03-28 DIAGNOSIS — G301 Alzheimer's disease with late onset: Secondary | ICD-10-CM | POA: Diagnosis not present

## 2017-05-09 DIAGNOSIS — G301 Alzheimer's disease with late onset: Secondary | ICD-10-CM | POA: Diagnosis not present

## 2017-05-09 DIAGNOSIS — R1310 Dysphagia, unspecified: Secondary | ICD-10-CM | POA: Diagnosis not present

## 2017-05-09 DIAGNOSIS — R451 Restlessness and agitation: Secondary | ICD-10-CM | POA: Diagnosis not present

## 2017-05-17 DIAGNOSIS — G301 Alzheimer's disease with late onset: Secondary | ICD-10-CM | POA: Diagnosis not present

## 2017-05-17 DIAGNOSIS — R1310 Dysphagia, unspecified: Secondary | ICD-10-CM | POA: Diagnosis not present

## 2017-05-17 DIAGNOSIS — I10 Essential (primary) hypertension: Secondary | ICD-10-CM | POA: Diagnosis not present

## 2017-05-17 DIAGNOSIS — M545 Low back pain: Secondary | ICD-10-CM | POA: Diagnosis not present

## 2017-06-02 DEATH — deceased

## 2019-02-28 IMAGING — CT CT CERVICAL SPINE W/O CM
3 series · 11 of 33 positions shown, 13 images · non-contrast
Comparison: CT head and CT cervical spine August 16, 2016 ; CT head
and CT cervical spine August 07, 2016

CLINICAL DATA: Pain following fall

EXAM:
CT HEAD WITHOUT CONTRAST
CT CERVICAL SPINE WITHOUT CONTRAST
TECHNIQUE: Multidetector CT imaging of the head and cervical spine was
performed following the standard protocol without intravenous
contrast. Multiplanar CT image reconstructions of the cervical spine
were also generated.

[Series 3: head 5.0 h31s · axial · 0.45mm/px · z∈[-73,+27]mm · 3 of 33 slices shown, 4 images]
[im 8/33  soft-tissue]
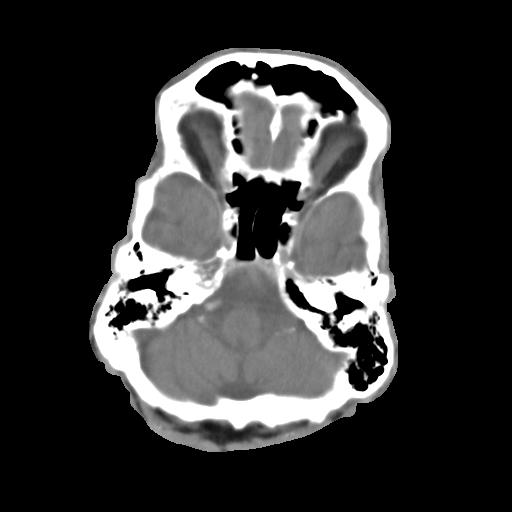
[im 8/33  bone]
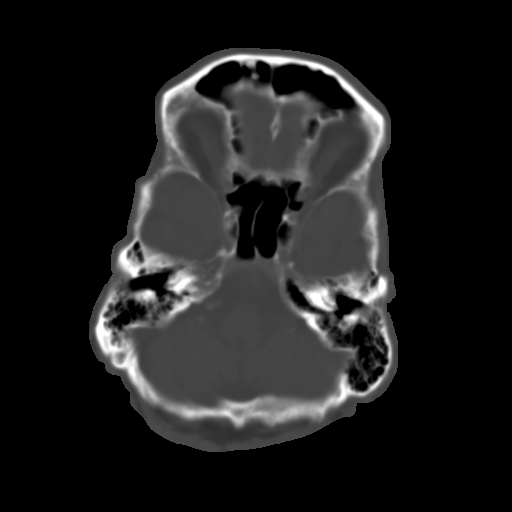
[im 18/33  bone]
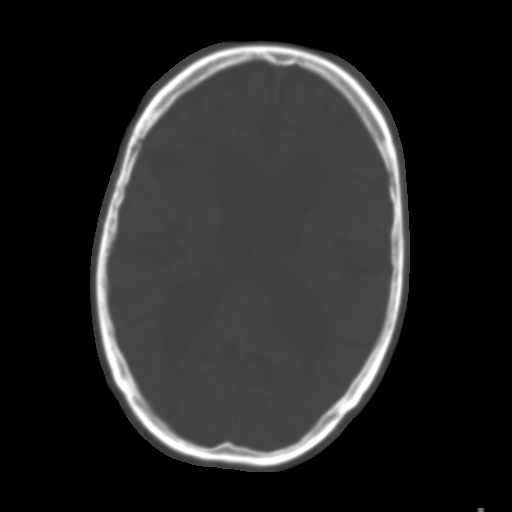
[im 28/33  bone]
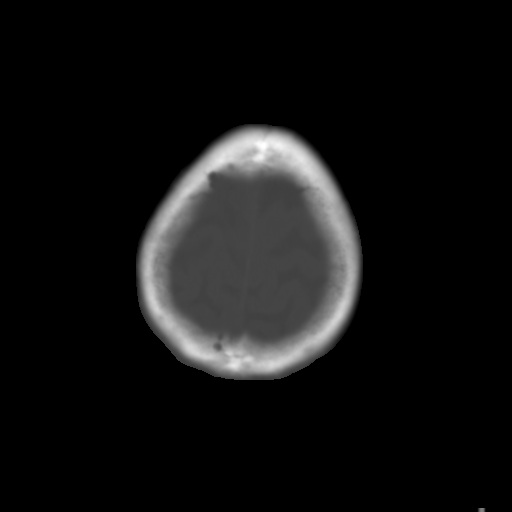

[Series 5: head 3.0 mpr cor · coronal · 0.31mm/px · 3 of 71 slices shown]
[im 15/71  bone]
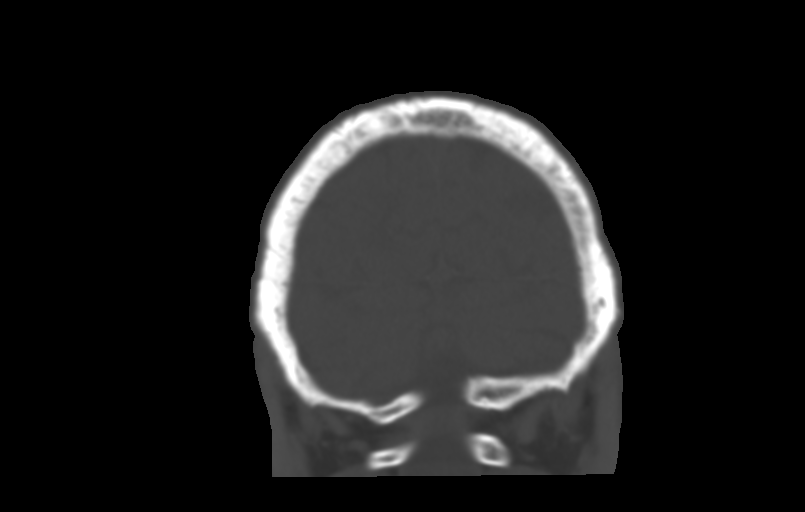
[im 29/71  bone]
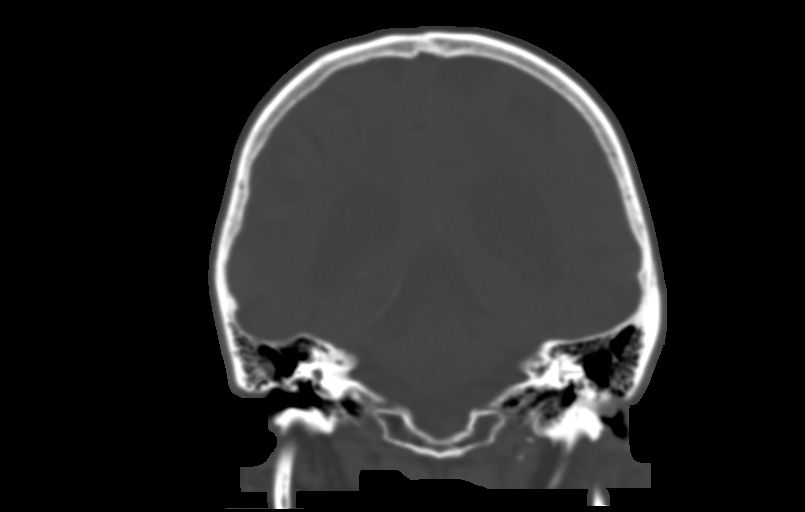
[im 43/71  bone]
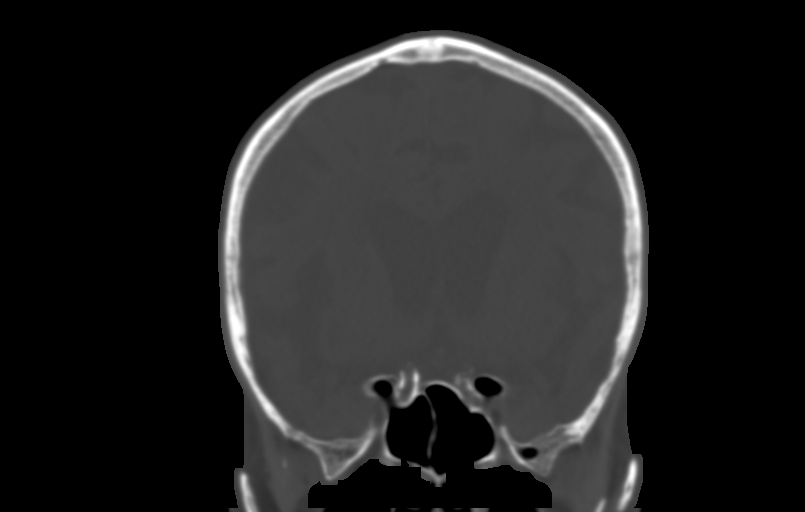

[Series 6: head 3.0 mpr sag · sagittal · 0.31mm/px · 5 of 56 slices shown, 6 images]
[im 19/56  bone]
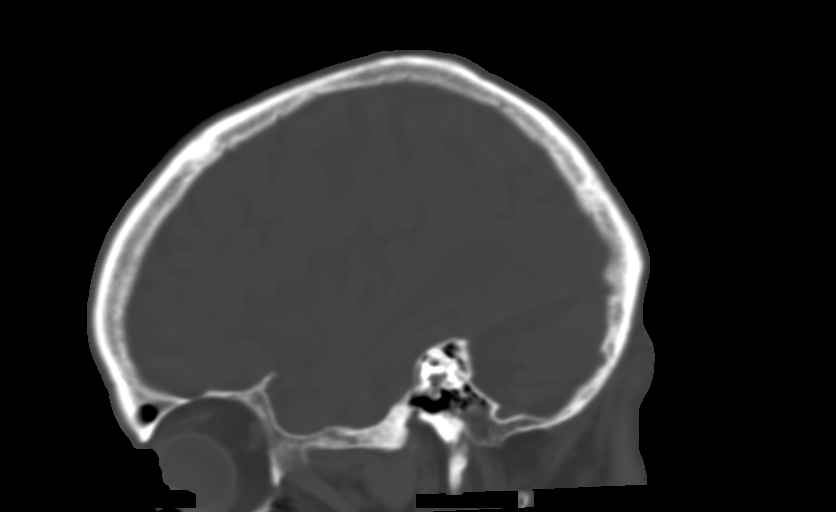
[im 23/56  bone]
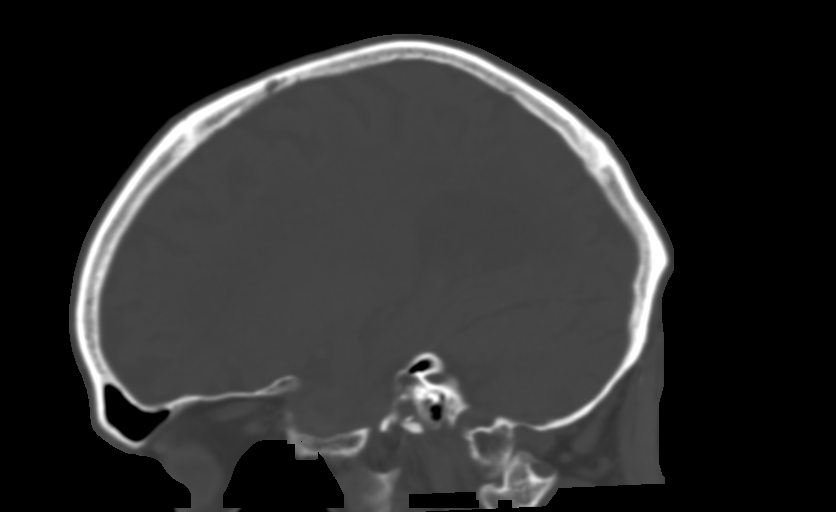
[im 28/56  soft-tissue]
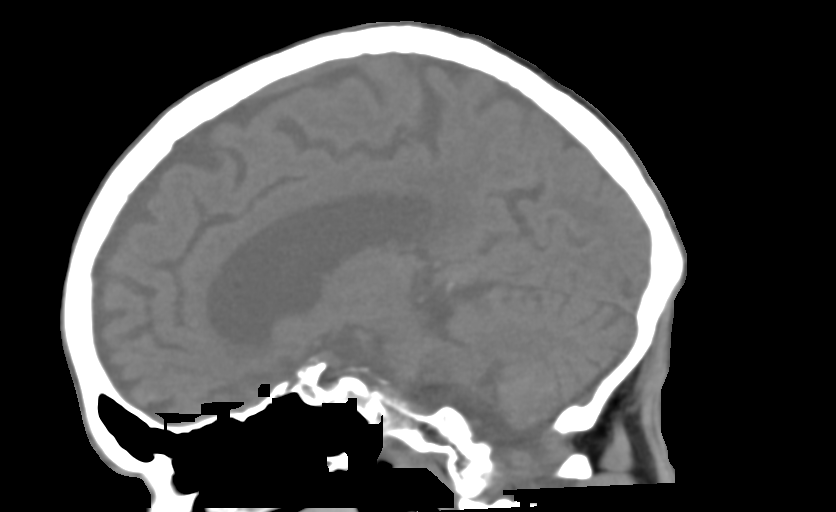
[im 28/56  bone]
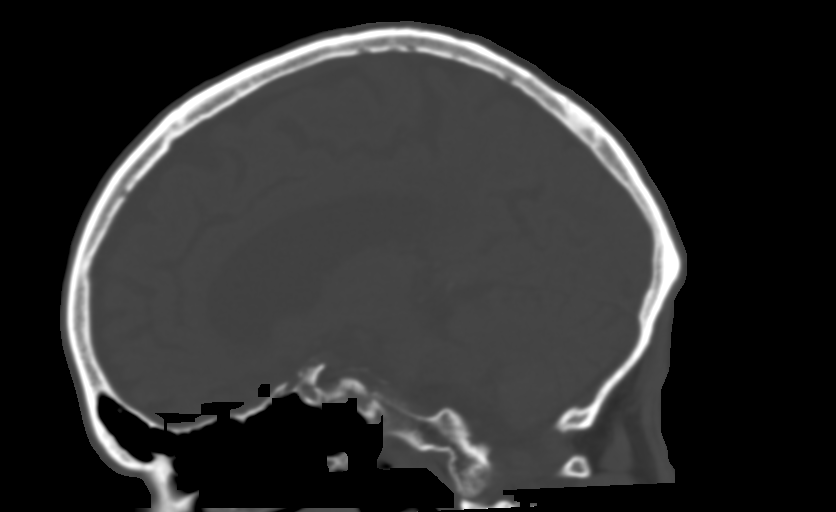
[im 33/56  bone]
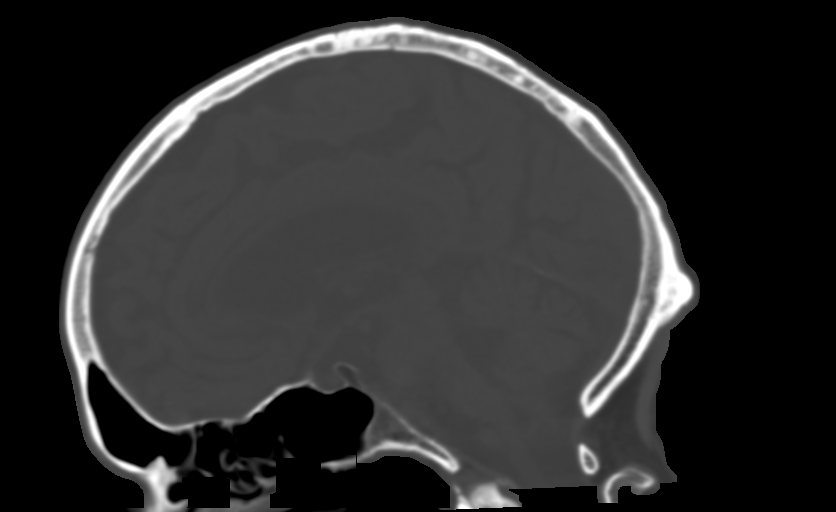
[im 37/56  bone]
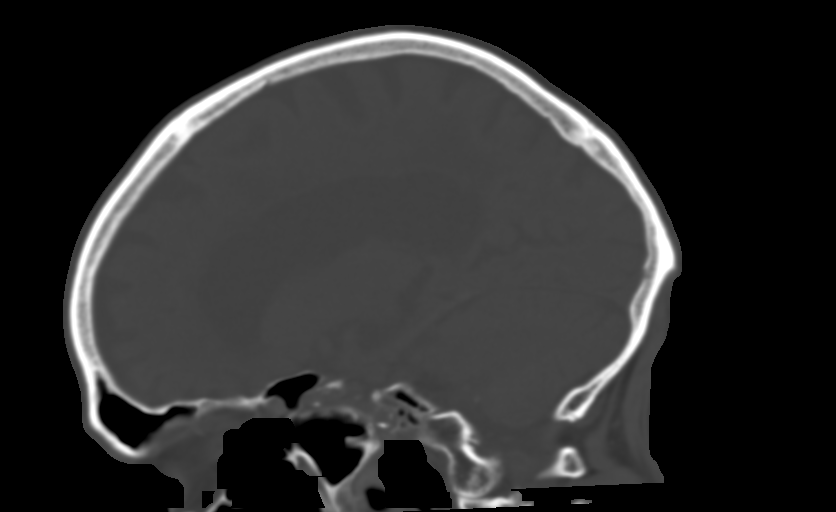

[11 of 33 positions shown; findings below may reference images not displayed]

FINDINGS: CT HEAD FINDINGS

Brain: Moderate diffuse atrophy is stable. There is no intracranial
mass, hemorrhage, extra-axial fluid collection, or midline shift.
There is small vessel disease throughout much of the centra
semiovale bilaterally, stable. There is no new gray-white
compartment lesion. No acute infarct evident.

Vascular: There is no demonstrable hyperdense vessel. There is
calcification throughout both carotid siphon regions as well as
areas of calcification in the distal vertebral arteries.

Skull: The bony calvarium appears intact. There are small bilateral
frontal scalp hematomas.

Sinuses/Orbits: There is mild mucosal thickening in several ethmoid
air cells bilaterally. Other visualized paranasal sinuses are clear.
No intraorbital lesions evident.

Other: Mastoid air cells are clear.

CT CERVICAL SPINE FINDINGS

Alignment: There is minimal anterolisthesis at C6 on C7 and minimal
anterolisthesis of C7 on T1, stable. There is no new
spondylolisthesis. There is cervical levoscoliosis.

Skull base and vertebrae: The skull base and craniocervical junction
regions appear unremarkable. There is stable slight anterior wedging
of the C7 and T2 vertebral bodies. No new evidence of fracture. No
blastic or lytic bone lesions. Bones are diffusely osteoporotic.

Soft tissues and spinal canal: Prevertebral soft tissues and
predental space regions are normal. There is no appreciable
paraspinous lesion. There is no cord or canal hematoma demonstrable.

Disc levels: There is partial ankylosis at C2-3. There is moderate
disc space narrowing at all levels elsewhere. There is facet
hypertrophy at all levels to varying degrees bilaterally. There is
marked exit foraminal narrowing impressing on the exiting nerve root
at C3-4 on the left, at C4-5 bilaterally, and to a lesser extent at
C5-6 on the right. No frank disc extrusion.

Upper chest: Visualized portions of the lung apices appear clear.

Other: There is atherosclerotic calcification in both carotid
arteries.
IMPRESSION: CT head: Persistent atrophy with supratentorial small vessel
disease. No intracranial mass, hemorrhage, or extra-axial fluid
collection. No acute appearing infarct. Multiple areas of
arteriovascular calcification. Mild ethmoid sinus mucosal
thickening. Small bilateral frontal scalp hematomas.

CT cervical spine: Slight anterior wedging at C7 and T1 remain
stable. No acute fracture. Slight spondylolisthesis at C6-7 and
C7-T1 are stable. No new spondylolisthesis. Bones are osteoporotic.
There is multifocal arthropathy. There is calcification in both
carotid arteries.

## 2019-02-28 IMAGING — CR DG HAND COMPLETE 3+V*R*
3 series · 3 of 3 positions shown · non-contrast
Comparison: None.

CLINICAL DATA: 89-year-old male status post slip and fall with pain
and skin tear.

EXAM:
RIGHT HAND - COMPLETE 3+ VIEW

[hand pa]
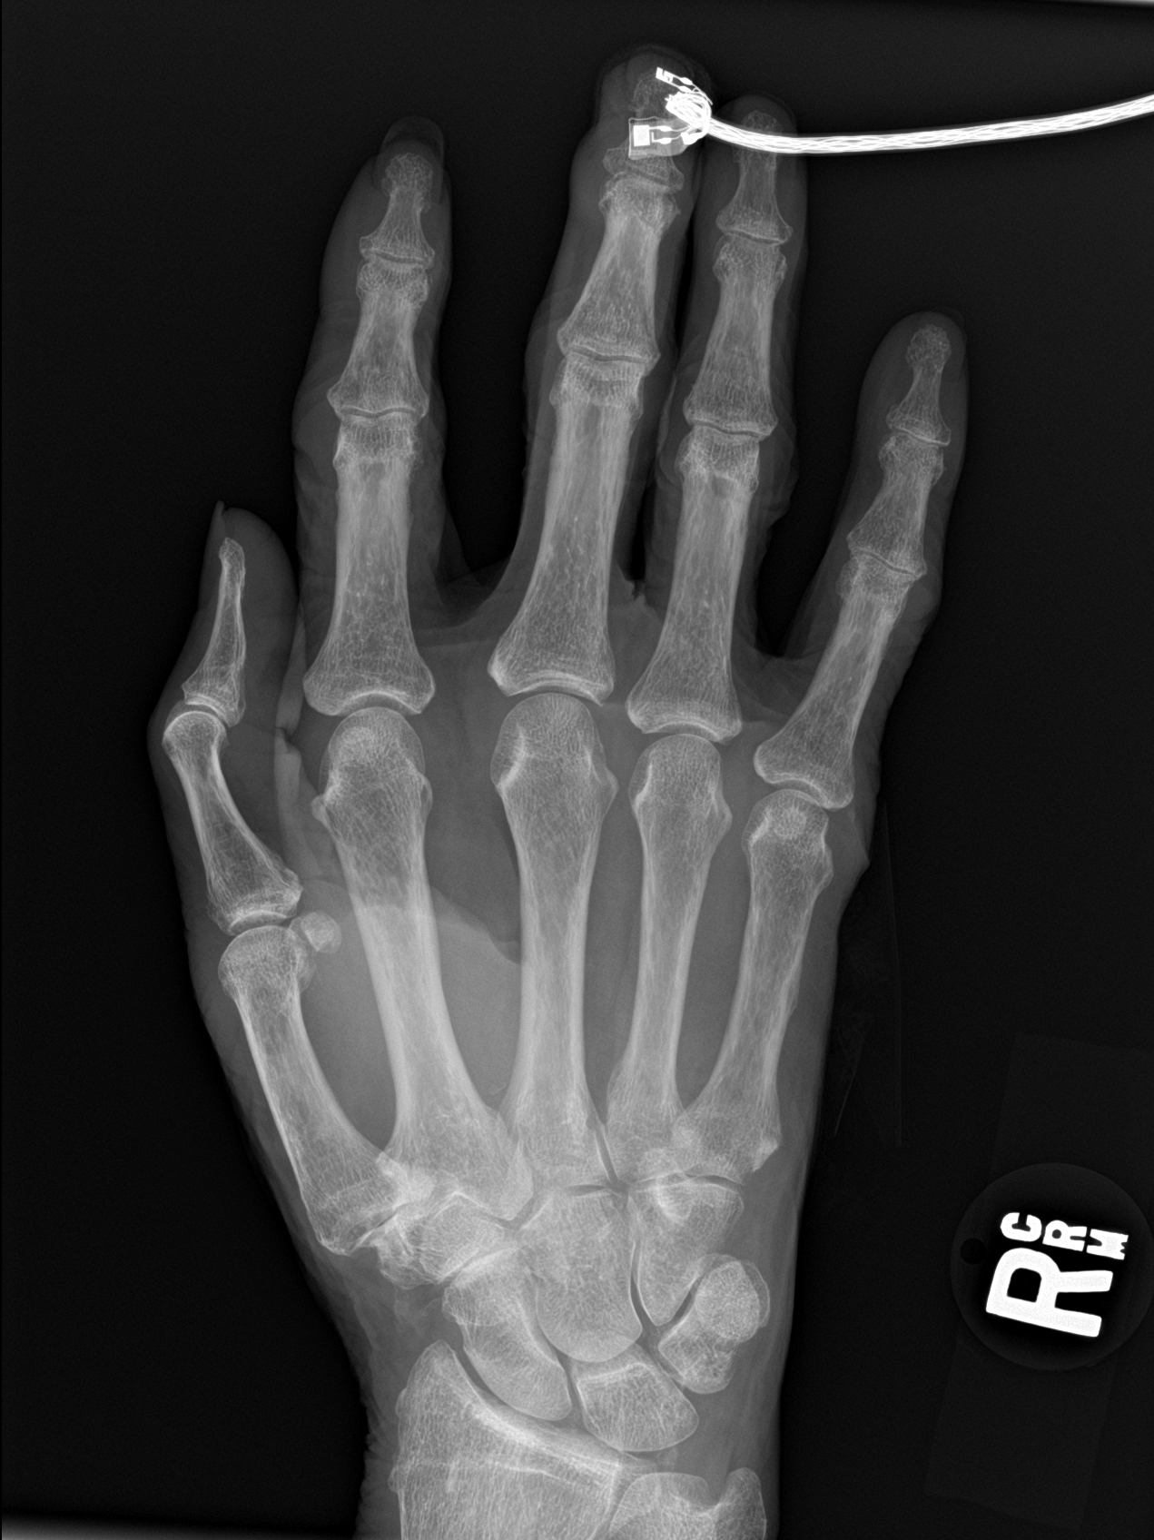

[hand obl]
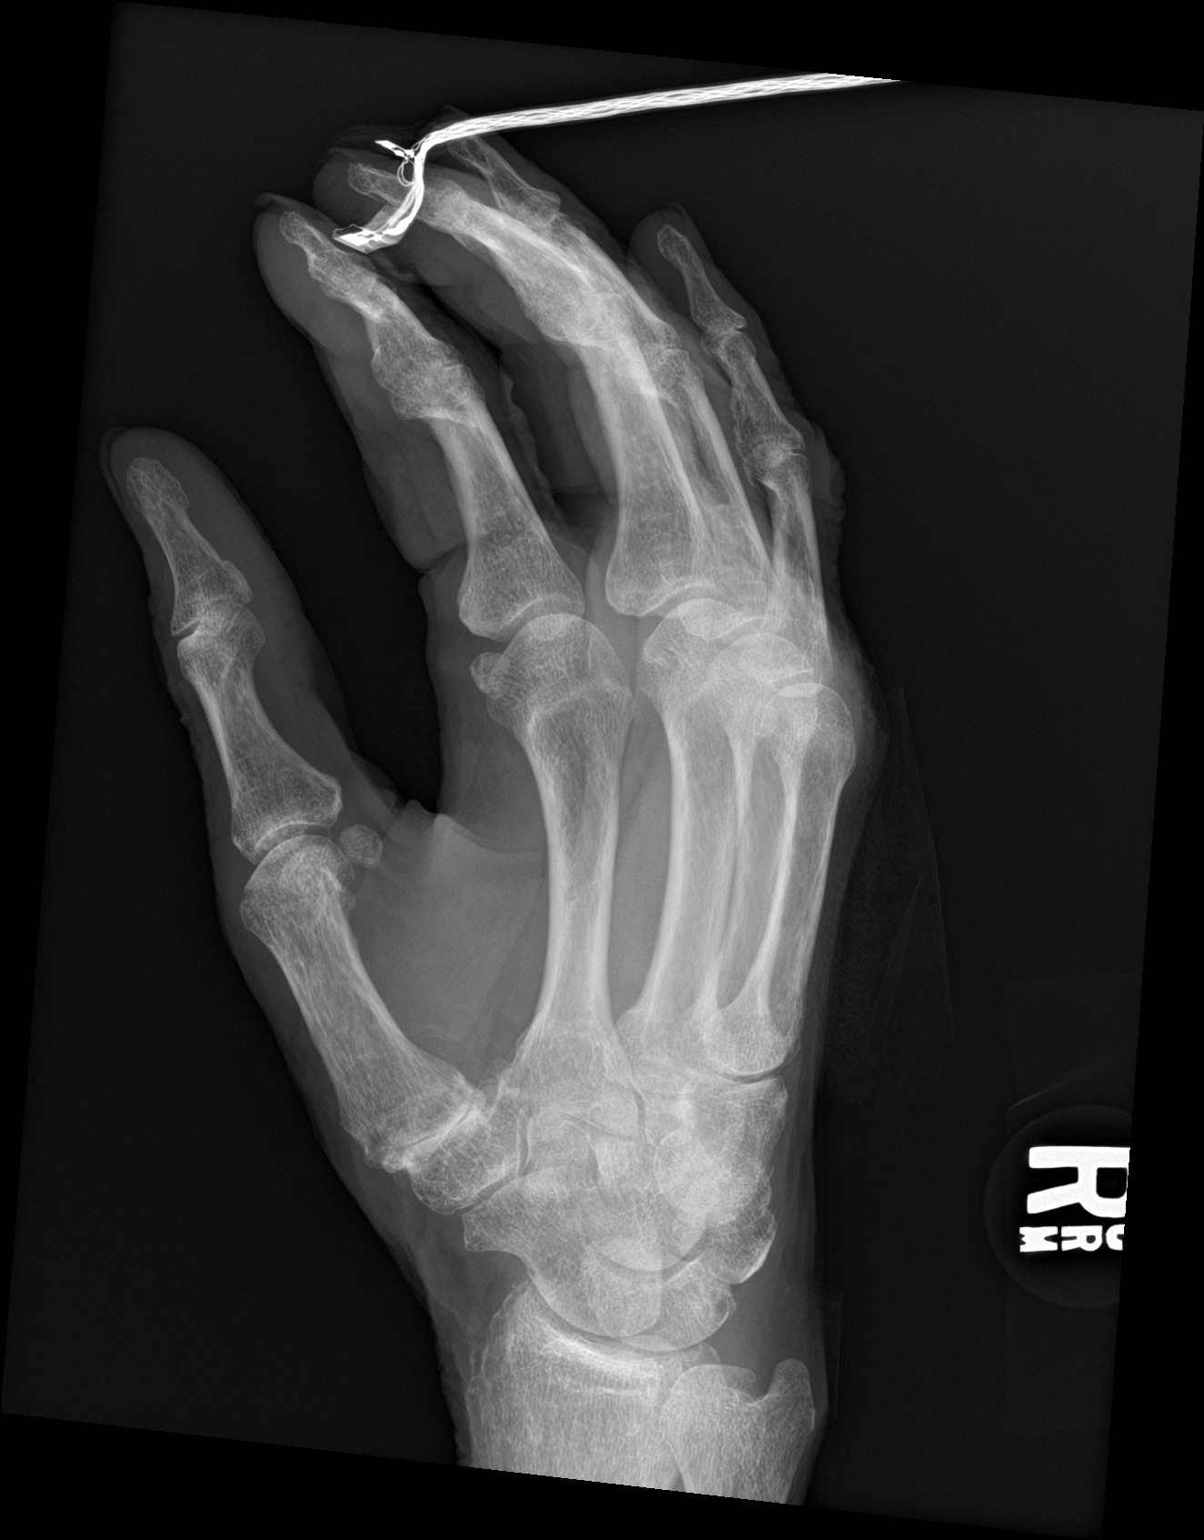

[hand lat]
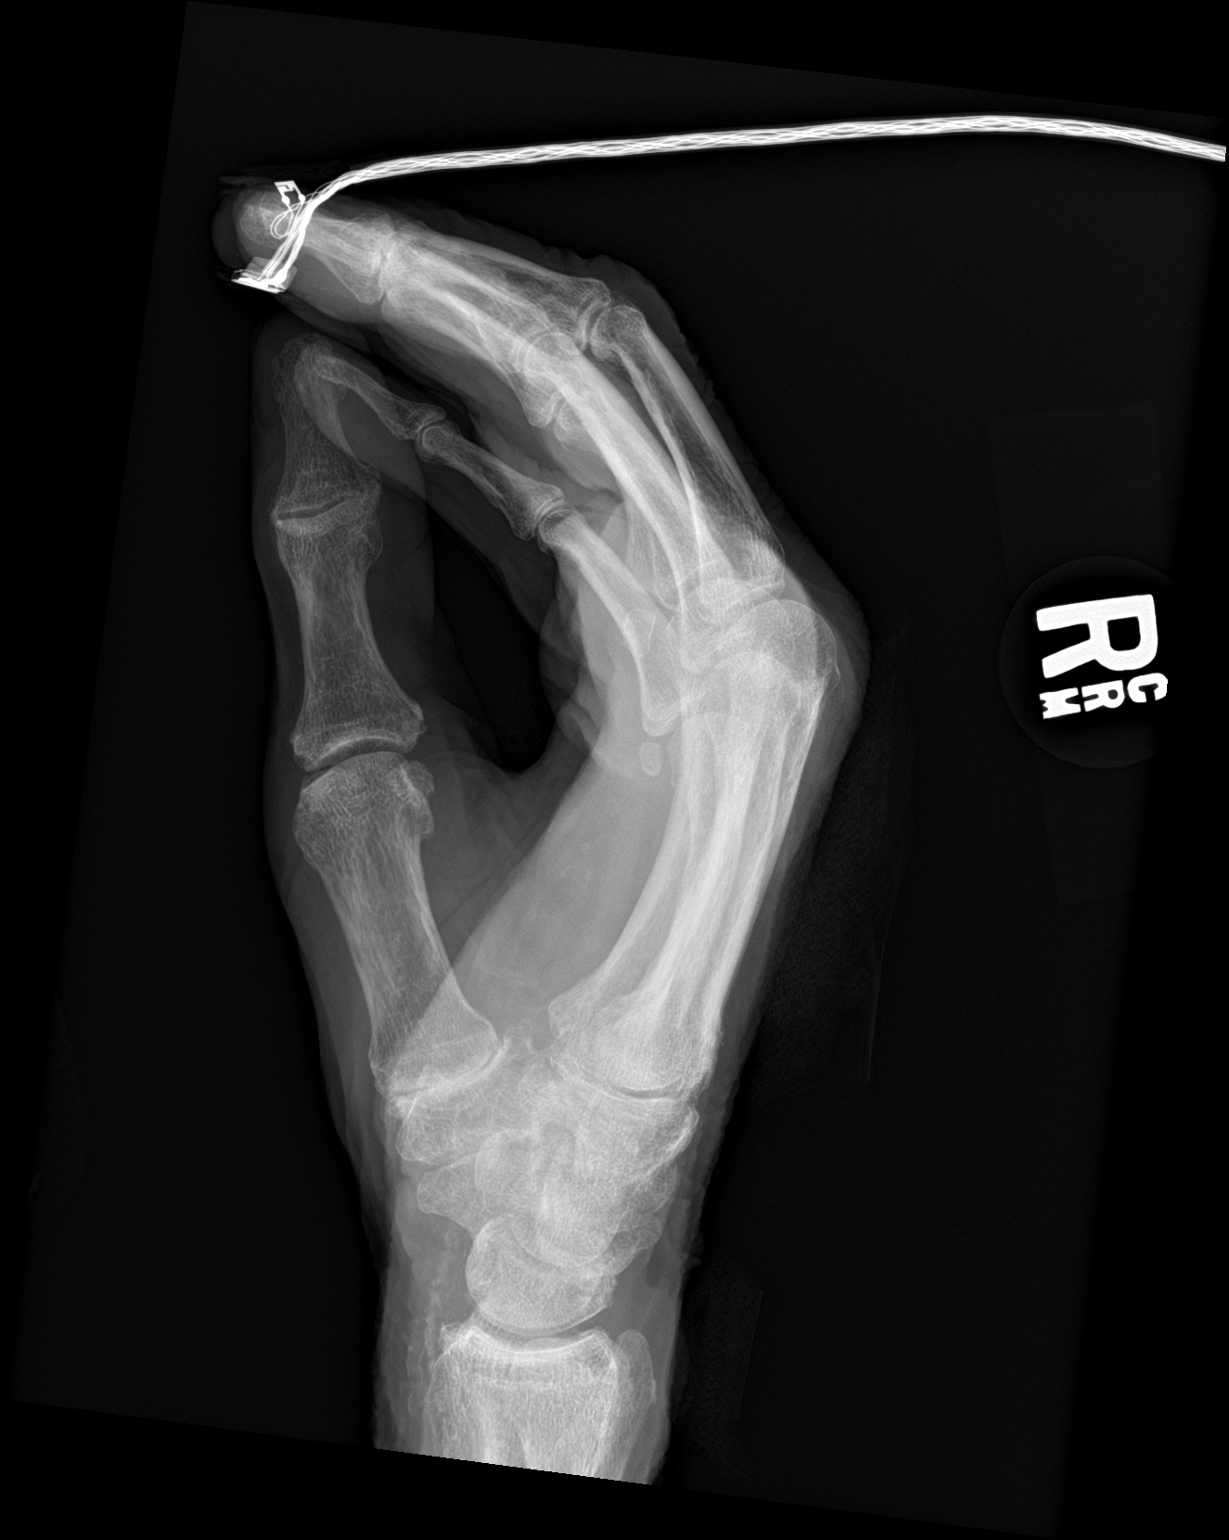

[3 of 3 positions shown; findings below may reference images not displayed]

FINDINGS: Bone mineralization is within normal limits for age. Distal radius
and ulna appear intact. Carpal bones appear intact and normally
aligned. Chronic joint space loss, subchondral sclerosis and
osteophytosis at the first CMC joint. Metacarpals appear intact.
Pulse oximeter on the right third distal phalanx. Other phalanges
appear intact. MCP and IP joint spaces are normal for age.
IMPRESSION: 1. No acute fracture or dislocation identified about the right hand.
2. Osteoarthritis at the right thumb basal joint.
